# Patient Record
Sex: Female | Born: 1950 | Race: White | Hispanic: No | Smoking: Former smoker
Health system: Southern US, Community
[De-identification: ages and names within clinical notes are randomized; demographics above are authoritative.]

## PROBLEM LIST (undated history)

## (undated) DIAGNOSIS — R0600 Dyspnea, unspecified: Secondary | ICD-10-CM

## (undated) DIAGNOSIS — K219 Gastro-esophageal reflux disease without esophagitis: Secondary | ICD-10-CM

## (undated) DIAGNOSIS — I071 Rheumatic tricuspid insufficiency: Secondary | ICD-10-CM

## (undated) DIAGNOSIS — I499 Cardiac arrhythmia, unspecified: Secondary | ICD-10-CM

## (undated) DIAGNOSIS — M1990 Unspecified osteoarthritis, unspecified site: Secondary | ICD-10-CM

## (undated) DIAGNOSIS — I471 Supraventricular tachycardia, unspecified: Secondary | ICD-10-CM

## (undated) DIAGNOSIS — G4710 Hypersomnia, unspecified: Secondary | ICD-10-CM

## (undated) DIAGNOSIS — I4891 Unspecified atrial fibrillation: Secondary | ICD-10-CM

## (undated) DIAGNOSIS — F32A Depression, unspecified: Secondary | ICD-10-CM

## (undated) DIAGNOSIS — I272 Pulmonary hypertension, unspecified: Secondary | ICD-10-CM

## (undated) DIAGNOSIS — M797 Fibromyalgia: Secondary | ICD-10-CM

## (undated) DIAGNOSIS — K649 Unspecified hemorrhoids: Secondary | ICD-10-CM

## (undated) DIAGNOSIS — C801 Malignant (primary) neoplasm, unspecified: Secondary | ICD-10-CM

## (undated) DIAGNOSIS — E079 Disorder of thyroid, unspecified: Secondary | ICD-10-CM

## (undated) DIAGNOSIS — J3089 Other allergic rhinitis: Secondary | ICD-10-CM

## (undated) DIAGNOSIS — B019 Varicella without complication: Secondary | ICD-10-CM

## (undated) DIAGNOSIS — K2270 Barrett's esophagus without dysplasia: Secondary | ICD-10-CM

## (undated) DIAGNOSIS — I10 Essential (primary) hypertension: Secondary | ICD-10-CM

## (undated) DIAGNOSIS — M6150 Other ossification of muscle, unspecified site: Secondary | ICD-10-CM

## (undated) DIAGNOSIS — C3490 Malignant neoplasm of unspecified part of unspecified bronchus or lung: Secondary | ICD-10-CM

## (undated) DIAGNOSIS — R519 Headache, unspecified: Secondary | ICD-10-CM

## (undated) DIAGNOSIS — E039 Hypothyroidism, unspecified: Secondary | ICD-10-CM

## (undated) HISTORY — PX: KNEE ARTHROSCOPY: SHX127

## (undated) HISTORY — PX: KNEE ARTHROSCOPY: SUR90

## (undated) HISTORY — PX: BACK SURGERY: SHX140

## (undated) HISTORY — PX: GASTRIC BYPASS OPEN: SUR638

## (undated) HISTORY — PX: CHOLECYSTECTOMY: SHX55

## (undated) HISTORY — PX: TONSILLECTOMY: SUR1361

## (undated) HISTORY — PX: JOINT REPLACEMENT: SHX530

## (undated) HISTORY — PX: ABDOMINAL HYSTERECTOMY: SHX81

## (undated) HISTORY — PX: PLANTAR FASCIA RELEASE: SHX2239

## (undated) HISTORY — DX: Malignant neoplasm of unspecified part of unspecified bronchus or lung: C34.90

## (undated) HISTORY — PX: OTHER SURGICAL HISTORY: SHX169

## (undated) HISTORY — PX: BREAST BIOPSY: SHX20

## (undated) HISTORY — PX: HEMORRHOID SURGERY: SHX153

---
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
## 1977-02-14 HISTORY — PX: ABDOMINAL HYSTERECTOMY: SHX81

## 2004-11-17 DIAGNOSIS — Z96652 Presence of left artificial knee joint: Secondary | ICD-10-CM

## 2004-11-17 HISTORY — DX: Presence of left artificial knee joint: Z96.652

## 2004-11-17 HISTORY — PX: TOTAL KNEE ARTHROPLASTY: SHX125

## 2005-11-10 ENCOUNTER — Ambulatory Visit: Payer: Self-pay | Admitting: Internal Medicine

## 2006-07-12 ENCOUNTER — Ambulatory Visit: Payer: Self-pay | Admitting: Pain Medicine

## 2006-07-24 ENCOUNTER — Ambulatory Visit: Payer: Self-pay | Admitting: Pain Medicine

## 2006-08-10 ENCOUNTER — Ambulatory Visit: Payer: Self-pay | Admitting: Pain Medicine

## 2006-08-15 ENCOUNTER — Ambulatory Visit: Payer: Self-pay | Admitting: Gastroenterology

## 2006-08-23 ENCOUNTER — Ambulatory Visit: Payer: Self-pay | Admitting: Pain Medicine

## 2006-09-21 ENCOUNTER — Ambulatory Visit: Payer: Self-pay | Admitting: General Practice

## 2006-10-11 ENCOUNTER — Other Ambulatory Visit: Payer: Self-pay

## 2006-10-11 ENCOUNTER — Ambulatory Visit: Payer: Self-pay | Admitting: General Practice

## 2006-10-19 ENCOUNTER — Inpatient Hospital Stay: Payer: Self-pay | Admitting: General Practice

## 2006-10-19 HISTORY — PX: KNEE ARTHROSCOPY: SUR90

## 2006-11-15 ENCOUNTER — Ambulatory Visit: Payer: Self-pay | Admitting: Internal Medicine

## 2007-11-19 ENCOUNTER — Ambulatory Visit: Payer: Self-pay | Admitting: Internal Medicine

## 2008-02-15 HISTORY — PX: CHOLECYSTECTOMY: SHX55

## 2008-03-10 ENCOUNTER — Observation Stay: Payer: Self-pay | Admitting: Internal Medicine

## 2008-03-26 ENCOUNTER — Ambulatory Visit: Payer: Self-pay | Admitting: Internal Medicine

## 2008-06-21 ENCOUNTER — Ambulatory Visit: Payer: Self-pay | Admitting: Internal Medicine

## 2008-06-23 ENCOUNTER — Ambulatory Visit: Payer: Self-pay | Admitting: Internal Medicine

## 2008-07-09 ENCOUNTER — Ambulatory Visit: Payer: Self-pay | Admitting: Surgery

## 2008-11-21 ENCOUNTER — Ambulatory Visit: Payer: Self-pay | Admitting: Family Medicine

## 2009-02-14 HISTORY — PX: OTHER SURGICAL HISTORY: SHX169

## 2009-02-26 ENCOUNTER — Ambulatory Visit: Payer: Self-pay | Admitting: Internal Medicine

## 2009-05-27 ENCOUNTER — Ambulatory Visit: Payer: Self-pay | Admitting: Pain Medicine

## 2009-06-01 ENCOUNTER — Ambulatory Visit: Payer: Self-pay | Admitting: Obstetrics & Gynecology

## 2009-06-09 ENCOUNTER — Ambulatory Visit: Payer: Self-pay | Admitting: Obstetrics & Gynecology

## 2009-08-12 ENCOUNTER — Ambulatory Visit: Payer: Self-pay | Admitting: Pain Medicine

## 2009-08-20 ENCOUNTER — Ambulatory Visit: Payer: Self-pay | Admitting: Pain Medicine

## 2009-09-03 ENCOUNTER — Ambulatory Visit: Payer: Self-pay | Admitting: Pain Medicine

## 2009-09-22 ENCOUNTER — Ambulatory Visit: Payer: Self-pay | Admitting: Family Medicine

## 2009-09-24 ENCOUNTER — Ambulatory Visit: Payer: Self-pay | Admitting: Family Medicine

## 2009-09-25 ENCOUNTER — Ambulatory Visit: Payer: Self-pay | Admitting: Internal Medicine

## 2009-09-27 ENCOUNTER — Ambulatory Visit: Payer: Self-pay | Admitting: Internal Medicine

## 2009-09-30 ENCOUNTER — Ambulatory Visit: Payer: Self-pay | Admitting: Family Medicine

## 2009-10-01 ENCOUNTER — Ambulatory Visit: Payer: Self-pay | Admitting: Pain Medicine

## 2009-11-18 ENCOUNTER — Ambulatory Visit: Payer: Self-pay | Admitting: Pain Medicine

## 2009-12-03 ENCOUNTER — Ambulatory Visit: Payer: Self-pay | Admitting: Pain Medicine

## 2009-12-07 ENCOUNTER — Ambulatory Visit: Payer: Self-pay | Admitting: Pain Medicine

## 2009-12-21 ENCOUNTER — Ambulatory Visit: Payer: Self-pay | Admitting: Pain Medicine

## 2010-03-04 ENCOUNTER — Ambulatory Visit: Payer: Self-pay | Admitting: Internal Medicine

## 2010-10-03 ENCOUNTER — Ambulatory Visit: Payer: Self-pay | Admitting: Internal Medicine

## 2011-02-04 ENCOUNTER — Ambulatory Visit: Payer: Self-pay | Admitting: Internal Medicine

## 2011-06-08 ENCOUNTER — Ambulatory Visit: Payer: Self-pay | Admitting: Internal Medicine

## 2011-06-22 ENCOUNTER — Ambulatory Visit: Payer: Self-pay | Admitting: Internal Medicine

## 2011-12-22 ENCOUNTER — Ambulatory Visit: Payer: Self-pay | Admitting: Gastroenterology

## 2011-12-22 HISTORY — PX: COLONOSCOPY: SHX174

## 2012-04-14 ENCOUNTER — Emergency Department: Payer: Self-pay | Admitting: Emergency Medicine

## 2012-05-07 ENCOUNTER — Ambulatory Visit: Payer: Self-pay | Admitting: Internal Medicine

## 2013-06-04 ENCOUNTER — Ambulatory Visit: Payer: Self-pay | Admitting: Family Medicine

## 2013-06-04 LAB — URINALYSIS, COMPLETE
Bilirubin,UR: NEGATIVE
Glucose,UR: NEGATIVE mg/dL (ref 0–75)
KETONE: NEGATIVE
Leukocyte Esterase: NEGATIVE
NITRITE: NEGATIVE
PH: 6.5 (ref 4.5–8.0)
PROTEIN: NEGATIVE
RBC, UR: NONE SEEN /HPF (ref 0–5)
Specific Gravity: 1.005 (ref 1.003–1.030)

## 2013-07-24 ENCOUNTER — Ambulatory Visit: Payer: Self-pay | Admitting: Internal Medicine

## 2014-01-01 ENCOUNTER — Ambulatory Visit: Payer: Self-pay | Admitting: Gastroenterology

## 2014-06-09 LAB — SURGICAL PATHOLOGY

## 2014-07-24 ENCOUNTER — Other Ambulatory Visit: Payer: Self-pay | Admitting: Internal Medicine

## 2014-07-24 DIAGNOSIS — Z1231 Encounter for screening mammogram for malignant neoplasm of breast: Secondary | ICD-10-CM

## 2014-08-01 ENCOUNTER — Ambulatory Visit
Admission: RE | Admit: 2014-08-01 | Discharge: 2014-08-01 | Disposition: A | Discharge status: Home / Self Care | Payer: Medicare Other | Source: Ambulatory Visit | Attending: Internal Medicine | Admitting: Internal Medicine

## 2014-08-01 DIAGNOSIS — Z1231 Encounter for screening mammogram for malignant neoplasm of breast: Secondary | ICD-10-CM | POA: Diagnosis not present

## 2014-08-28 ENCOUNTER — Other Ambulatory Visit: Payer: Self-pay | Admitting: Physical Medicine and Rehabilitation

## 2014-08-28 DIAGNOSIS — M5416 Radiculopathy, lumbar region: Secondary | ICD-10-CM

## 2014-09-04 ENCOUNTER — Ambulatory Visit
Admission: RE | Admit: 2014-09-04 | Discharge: 2014-09-04 | Disposition: A | Discharge status: Home / Self Care | Payer: Medicare Other | Source: Ambulatory Visit | Attending: Physical Medicine and Rehabilitation | Admitting: Physical Medicine and Rehabilitation

## 2014-09-04 DIAGNOSIS — M545 Low back pain: Secondary | ICD-10-CM | POA: Diagnosis not present

## 2014-09-04 DIAGNOSIS — M5416 Radiculopathy, lumbar region: Secondary | ICD-10-CM

## 2015-04-02 ENCOUNTER — Other Ambulatory Visit: Payer: Self-pay | Admitting: Physician Assistant

## 2015-04-02 ENCOUNTER — Other Ambulatory Visit (HOSPITAL_COMMUNITY): Payer: Self-pay | Admitting: Physician Assistant

## 2015-04-02 DIAGNOSIS — M5416 Radiculopathy, lumbar region: Secondary | ICD-10-CM

## 2015-04-02 DIAGNOSIS — M5116 Intervertebral disc disorders with radiculopathy, lumbar region: Secondary | ICD-10-CM

## 2015-04-22 ENCOUNTER — Other Ambulatory Visit: Payer: Self-pay | Admitting: Physician Assistant

## 2015-04-22 ENCOUNTER — Ambulatory Visit
Admission: RE | Admit: 2015-04-22 | Discharge: 2015-04-22 | Disposition: A | Discharge status: Home / Self Care | Payer: Medicare Other | Source: Ambulatory Visit | Attending: Physician Assistant | Admitting: Physician Assistant

## 2015-04-22 DIAGNOSIS — I708 Atherosclerosis of other arteries: Secondary | ICD-10-CM | POA: Insufficient documentation

## 2015-04-22 DIAGNOSIS — M5116 Intervertebral disc disorders with radiculopathy, lumbar region: Secondary | ICD-10-CM | POA: Diagnosis present

## 2015-04-22 DIAGNOSIS — M5416 Radiculopathy, lumbar region: Secondary | ICD-10-CM

## 2015-06-24 HISTORY — PX: POSTERIOR LAMINECTOMY / DECOMPRESSION LUMBAR SPINE: SUR740

## 2015-11-04 ENCOUNTER — Other Ambulatory Visit: Payer: Self-pay | Admitting: Internal Medicine

## 2015-11-04 DIAGNOSIS — Z1231 Encounter for screening mammogram for malignant neoplasm of breast: Secondary | ICD-10-CM

## 2015-11-10 ENCOUNTER — Ambulatory Visit
Admission: RE | Admit: 2015-11-10 | Discharge: 2015-11-10 | Disposition: A | Discharge status: Home / Self Care | Payer: Medicare Other | Source: Ambulatory Visit | Attending: Internal Medicine | Admitting: Internal Medicine

## 2015-11-10 DIAGNOSIS — Z1231 Encounter for screening mammogram for malignant neoplasm of breast: Secondary | ICD-10-CM | POA: Diagnosis present

## 2016-01-12 ENCOUNTER — Ambulatory Visit
Admission: EM | Admit: 2016-01-12 | Discharge: 2016-01-12 | Disposition: A | Discharge status: Home / Self Care | Payer: Medicare Other | Attending: Emergency Medicine | Admitting: Emergency Medicine

## 2016-01-12 DIAGNOSIS — J069 Acute upper respiratory infection, unspecified: Secondary | ICD-10-CM

## 2016-01-12 HISTORY — DX: Essential (primary) hypertension: I10

## 2016-01-12 HISTORY — DX: Disorder of thyroid, unspecified: E07.9

## 2016-01-12 HISTORY — DX: Fibromyalgia: M79.7

## 2016-01-12 HISTORY — DX: Hypersomnia, unspecified: G47.10

## 2016-01-12 HISTORY — DX: Varicella without complication: B01.9

## 2016-01-12 HISTORY — DX: Unspecified hemorrhoids: K64.9

## 2016-01-12 HISTORY — DX: Gastro-esophageal reflux disease without esophagitis: K21.9

## 2016-01-12 HISTORY — DX: Unspecified osteoarthritis, unspecified site: M19.90

## 2016-01-12 HISTORY — DX: Other ossification of muscle, unspecified site: M61.50

## 2016-01-12 MED ORDER — AEROCHAMBER PLUS MISC: 2 refills | Status: DC

## 2016-01-12 MED ORDER — GUAIFENESIN-CODEINE 100-10 MG/5ML PO SYRP: 10.0000 mL | ORAL_SOLUTION | Freq: Four times a day (QID) | ORAL | 0 refills | Status: DC | PRN

## 2016-01-12 MED ORDER — BENZONATATE 200 MG PO CAPS: 200.0000 mg | ORAL_CAPSULE | Freq: Three times a day (TID) | ORAL | 0 refills | Status: DC | PRN

## 2016-01-12 MED ORDER — AZITHROMYCIN 250 MG PO TABS: 250.0000 mg | ORAL_TABLET | Freq: Every day | ORAL | 0 refills | Status: DC

## 2016-01-12 MED ORDER — MOMETASONE FUROATE 50 MCG/ACT NA SUSP: 2.0000 | Freq: Every day | NASAL | 0 refills | Status: DC

## 2016-01-12 MED ORDER — ALBUTEROL SULFATE HFA 108 (90 BASE) MCG/ACT IN AERS: 1.0000 | INHALATION_SPRAY | Freq: Four times a day (QID) | RESPIRATORY_TRACT | 0 refills | Status: DC | PRN

## 2016-01-12 NOTE — Discharge Instructions (Signed)
You may take 600- 800 mg of motrin with 1 gram of tylenol up to 3 times a day as needed for pain. This is an effective combination for pain.  Use a neti pot or the NeilMed sinus rinse as often as you want to to reduce nasal congestion. Follow the directions on the box.   Go to www.goodrx.com to look up your medications. This will give you a list of where you can find your prescriptions at the most affordable prices.

## 2016-01-12 NOTE — ED Provider Notes (Signed)
HPI  SUBJECTIVE:  Glenda Gomez is a 65 y.o. female who presents with nasal congestion, rhinorrhea, postnasal drip, scratchy throat, cough productive of thick yellowish sputum, chest tightness and shortness of breath secondary to the cough and the past 8 days. States that she is not getting worse, but is just not "getting better". She reports sinus pressure and a frontal sinus headache. She has tried over-the-counter cough syrup, Mucinex without improvement in her symptoms. There are no other aggravating or alleviating factors. No sinus pain, upper dental pain, purulent nasal drainage. No fevers, bodyaches, no antipyretic in the past 6-8 hours. no recent antibiotics. she did get a flu shot this year. Past medical history of hypertension, hypothyroidism, bronchitis. No history of asthma, emphysema, COPD, smoking, pneumonia, diabetes. JYN:WGNFA,[email or web address], MD    . Diagnosis Date  . Arthritis   . Chicken pox   . Fibromyalgia   . GERD (gastroesophageal reflux disease)   . Hemorrhoid   . Hypersomnia   . Hypertension   . Myositis ossificans   . Osteoarthritis   . Sleep apnea   . Thyroid disease     Past Surgical History:  Procedure Laterality Date  .  bengin tongue lesion     Benign  . ABDOMINAL HYSTERECTOMY    . bladder tack    . BREAST BIOPSY Left    neg  . CHOLECYSTECTOMY    . GASTRIC BYPASS OPEN    . hemrrhoidectomy    . KNEE ARTHROSCOPY    . plantar fascia Left   . TONSILLECTOMY      Family History  Problem Relation Age of Onset  . Breast cancer Sister 69  . Breast cancer Maternal Aunt 80  . Breast cancer Sister 67  . Alzheimer's disease Mother   . Arthritis Mother   . Diabetes Mother   . Cancer Mother   . Heart failure Brother     Social History  Substance Use Topics  . Smoking status: Never Smoker  . Smokeless tobacco: Never Used  . Alcohol use No    No current facility-administered medications for this encounter.   Current Outpatient Prescriptions:  .   cyclobenzaprine (FLEXERIL) 10 MG tablet, Take 10 mg by mouth 3 (three) times daily as needed for muscle spasms., Disp: , Rfl:  .  diclofenac (VOLTAREN) 75 MG EC tablet, Take 75 mg by mouth 2 (two) times daily., Disp: , Rfl:  .  estradiol (ESTRACE) 0.5 MG tablet, Take 0.5 mg by mouth daily., Disp: , Rfl:  .  levothyroxine (SYNTHROID, LEVOTHROID) 100 MCG tablet, Take 90 mcg by mouth daily before breakfast., Disp: , Rfl:  .  omeprazole (PRILOSEC) 20 MG capsule, Take 30 mg by mouth daily., Disp: , Rfl:  .  traZODone (DESYREL) 50 MG tablet, Take 50 mg by mouth at bedtime., Disp: , Rfl:  .  triamterene-hydrochlorothiazide (MAXZIDE-25) 37.5-25 MG tablet, Take 1 tablet by mouth daily., Disp: , Rfl:  .  zolpidem (AMBIEN) 5 MG tablet, Take 5 mg by mouth at bedtime as needed for sleep., Disp: , Rfl:  .  albuterol (PROVENTIL HFA;VENTOLIN HFA) 108 (90 Base) MCG/ACT inhaler, Inhale 1-2 puffs into the lungs every 6 (six) hours as needed for wheezing or shortness of breath., Disp: 1 Inhaler, Rfl: 0 .  azithromycin (ZITHROMAX) 250 MG tablet, Take 1 tablet (250 mg total) by mouth daily. 2 tabs po on day 1, 1 tab po on days 2-5, Disp: 6 tablet, Rfl: 0 .  benzonatate (TESSALON) 200 MG capsule, Take 1 capsule (  200 mg total) by mouth 3 (three) times daily as needed for cough., Disp: 20 capsule, Rfl: 0 .  guaiFENesin-codeine (CHERATUSSIN AC) 100-10 MG/5ML syrup, Take 10 mLs by mouth 4 (four) times daily as needed for cough or congestion., Disp: 120 mL, Rfl: 0 .  mometasone (NASONEX) 50 MCG/ACT nasal spray, Place 2 sprays into the nose daily., Disp: 17 g, Rfl: 0 .  Spacer/Aero-Holding Chambers (AEROCHAMBER PLUS) inhaler, Use as instructed, Disp: 1 each, Rfl: 2  Allergies  Allergen Reactions  . Flagyl [Metronidazole] Diarrhea     ROS  As noted in HPI.   Physical Exam  BP (!) 158/76 (BP Location: Left Arm)   Pulse 62   Temp 97.9 F (36.6 C) (Oral)   Resp 18   Ht '5\' 3"'$  (1.6 m)   Wt 220 lb (99.8 kg)   SpO2  100%   BMI 38.97 kg/m   Constitutional: Well developed, well nourished, no acute distress Eyes:  EOMI, conjunctiva normal bilaterally HENT: Normocephalic, atraumatic,mucus membranes moist. No sinus tenderness. Positive mild nasal congestion. Normal turbinates. Tonsils surgically absent. Positive postnasal drip.  Neck: No cervical lymphadenopathy  Respiratory: Normal inspiratory effort lungs clear bilaterally, good air movement  Cardiovascular: Normal rate regular rhythm no murmurs rubs or gallops GI: nondistended skin: No rash, skin intact Musculoskeletal: no deformities Neurologic: Alert & oriented x 3, no focal neuro deficits Psychiatric: Speech and behavior appropriate   ED Course   Medications - No data to display  No orders of the defined types were placed in this encounter.   No results found for this or any previous visit (from the past 24 hour(s)). No results found.  ED Clinical Impression  Acute upper respiratory infection   ED Assessment/Plan  Presentation consistent with URI, however, given duration of symptoms and age, will send home with azithromycin for 5 days in addition to Nasonex, cheratussin, Tessalon, albuterol with spacer. She is start saline nasal irrigation continue Mucinex. Advised that she may have a lingering cough for the next 2-3 weeks as she continues to heal and that this does not necessarily indicate need for repeat round of antibiotics. Follow up with PMD as needed. Discussed  MDM, plan and followup with patient. Patient agrees with plan.   Meds ordered this encounter  Medications  . levothyroxine (SYNTHROID, LEVOTHROID) 100 MCG tablet    Sig: Take 90 mcg by mouth daily before breakfast.  . omeprazole (PRILOSEC) 20 MG capsule    Sig: Take 30 mg by mouth daily.  Marland Kitchen DISCONTD: cetirizine (ZYRTEC) 10 MG tablet    Sig: Take 10 mg by mouth daily.  . cyclobenzaprine (FLEXERIL) 10 MG tablet    Sig: Take 10 mg by mouth 3 (three) times daily as  needed for muscle spasms.  . diclofenac (VOLTAREN) 75 MG EC tablet    Sig: Take 75 mg by mouth 2 (two) times daily.  . traZODone (DESYREL) 50 MG tablet    Sig: Take 50 mg by mouth at bedtime.  Marland Kitchen zolpidem (AMBIEN) 5 MG tablet    Sig: Take 5 mg by mouth at bedtime as needed for sleep.  Marland Kitchen DISCONTD: traMADol (ULTRAM) 50 MG tablet    Sig: Take by mouth every 6 (six) hours as needed.  . triamterene-hydrochlorothiazide (MAXZIDE-25) 37.5-25 MG tablet    Sig: Take 1 tablet by mouth daily.  Marland Kitchen estradiol (ESTRACE) 0.5 MG tablet    Sig: Take 0.5 mg by mouth daily.  Marland Kitchen albuterol (PROVENTIL HFA;VENTOLIN HFA) 108 (90 Base) MCG/ACT inhaler  Sig: Inhale 1-2 puffs into the lungs every 6 (six) hours as needed for wheezing or shortness of breath.    Dispense:  1 Inhaler    Refill:  0  . mometasone (NASONEX) 50 MCG/ACT nasal spray    Sig: Place 2 sprays into the nose daily.    Dispense:  17 g    Refill:  0  . Spacer/Aero-Holding Chambers (AEROCHAMBER PLUS) inhaler    Sig: Use as instructed    Dispense:  1 each    Refill:  2  . benzonatate (TESSALON) 200 MG capsule    Sig: Take 1 capsule (200 mg total) by mouth 3 (three) times daily as needed for cough.    Dispense:  20 capsule    Refill:  0  . azithromycin (ZITHROMAX) 250 MG tablet    Sig: Take 1 tablet (250 mg total) by mouth daily. 2 tabs po on day 1, 1 tab po on days 2-5    Dispense:  6 tablet    Refill:  0  . guaiFENesin-codeine (CHERATUSSIN AC) 100-10 MG/5ML syrup    Sig: Take 10 mLs by mouth 4 (four) times daily as needed for cough or congestion.    Dispense:  120 mL    Refill:  0    *This clinic note was created using [employer]. Therefore, there may be occasional mistakes despite careful proofreading.  ?   Melynda Ripple, MD 01/12/16 [phone]

## 2016-01-12 NOTE — ED Triage Notes (Signed)
Pt c/o cough, and head congestion. Watery eyes and sinus pressure.

## 2016-06-22 ENCOUNTER — Encounter
Admission: RE | Admit: 2016-06-22 | Discharge: 2016-06-22 | Disposition: A | Discharge status: Home / Self Care | Payer: Medicare Other | Source: Ambulatory Visit | Attending: Orthopedic Surgery | Admitting: Orthopedic Surgery

## 2016-06-22 DIAGNOSIS — Z01810 Encounter for preprocedural cardiovascular examination: Secondary | ICD-10-CM | POA: Insufficient documentation

## 2016-06-22 DIAGNOSIS — I10 Essential (primary) hypertension: Secondary | ICD-10-CM | POA: Diagnosis not present

## 2016-06-22 DIAGNOSIS — Z01812 Encounter for preprocedural laboratory examination: Secondary | ICD-10-CM | POA: Diagnosis not present

## 2016-06-22 HISTORY — DX: Hypothyroidism, unspecified: E03.9

## 2016-06-22 LAB — URINALYSIS, ROUTINE W REFLEX MICROSCOPIC
Bilirubin Urine: NEGATIVE
GLUCOSE, UA: NEGATIVE mg/dL
HGB URINE DIPSTICK: NEGATIVE
Ketones, ur: NEGATIVE mg/dL
LEUKOCYTES UA: NEGATIVE
Nitrite: NEGATIVE
PH: 5 (ref 5.0–8.0)
Protein, ur: NEGATIVE mg/dL
Specific Gravity, Urine: 1.017 (ref 1.005–1.030)

## 2016-06-22 LAB — TYPE AND SCREEN
ABO/RH(D): A POS
Antibody Screen: NEGATIVE

## 2016-06-22 LAB — COMPREHENSIVE METABOLIC PANEL
ALK PHOS: 84 U/L (ref 38–126)
ALT: 16 U/L (ref 14–54)
ANION GAP: 9 (ref 5–15)
AST: 23 U/L (ref 15–41)
Albumin: 3.9 g/dL (ref 3.5–5.0)
BUN: 20 mg/dL (ref 6–20)
CALCIUM: 9.2 mg/dL (ref 8.9–10.3)
CHLORIDE: 103 mmol/L (ref 101–111)
CO2: 26 mmol/L (ref 22–32)
CREATININE: 0.82 mg/dL (ref 0.44–1.00)
Glucose, Bld: 95 mg/dL (ref 65–99)
Potassium: 3.6 mmol/L (ref 3.5–5.1)
SODIUM: 138 mmol/L (ref 135–145)
Total Bilirubin: 0.4 mg/dL (ref 0.3–1.2)
Total Protein: 7.4 g/dL (ref 6.5–8.1)

## 2016-06-22 LAB — CBC
HCT: 41.1 % (ref 35.0–47.0)
Hemoglobin: 13.7 g/dL (ref 12.0–16.0)
MCH: 25.9 pg — AB (ref 26.0–34.0)
MCHC: 33.3 g/dL (ref 32.0–36.0)
MCV: 77.8 fL — AB (ref 80.0–100.0)
PLATELETS: 304 10*3/uL (ref 150–440)
RBC: 5.29 MIL/uL — AB (ref 3.80–5.20)
RDW: 15 % — ABNORMAL HIGH (ref 11.5–14.5)
WBC: 8.1 10*3/uL (ref 3.6–11.0)

## 2016-06-22 LAB — SURGICAL PCR SCREEN
MRSA, PCR: NEGATIVE
STAPHYLOCOCCUS AUREUS: NEGATIVE

## 2016-06-22 LAB — APTT: aPTT: 27 seconds (ref 24–36)

## 2016-06-22 LAB — PROTIME-INR
INR: 0.98
PROTHROMBIN TIME: 13 s (ref 11.4–15.2)

## 2016-06-22 LAB — C-REACTIVE PROTEIN: CRP: 0.9 mg/dL (ref <1.0)

## 2016-06-22 LAB — SEDIMENTATION RATE: Sed Rate: 10 mm/hr (ref 0–30)

## 2016-06-22 NOTE — Pre-Procedure Instructions (Signed)
Dr. Rosey Bath notified of patient history of myositis ossificans, and stated "no problems for anesthesia."

## 2016-06-22 NOTE — Patient Instructions (Signed)
Your procedure is scheduled on: Jul 06, 2016 (Wednesday) Report to Same Day Surgery 2nd floor medical mall [facility] Entrance-take elevator on left to 2nd floor.  Check in with surgery information desk.) To find out your arrival time please call [phone] between 1PM - 3PM on Jul 05, 2016 (Tuesday)  Remember: Instructions that are not followed completely may result in serious medical risk, up to and including death, or upon the discretion of your surgeon and anesthesiologist your surgery may need to be rescheduled.    _x___ 1. Do not eat food or drink liquids after midnight. No gum chewing or hard candies                                __x__ 2. No Alcohol for 24 hours before or after surgery.   __x__3. No Smoking for 24 prior to surgery.   ____  4. Bring all medications with you on the day of surgery if instructed.    __x__ 5. Notify your doctor if there is any change in your medical condition     (cold, fever, infections).     Do not wear jewelry, make-up, hairpins, clips or nail polish.  Do not wear lotions, powders, or perfumes.   Do not shave 48 hours prior to surgery. Men may shave face and neck.  Do not bring valuables to the hospital.    [facility] is not responsible for any belongings or valuables.               Contacts, dentures or bridgework may not be worn into surgery.  Leave your suitcase in the car. After surgery it may be brought to your room.  For patients admitted to the hospital, discharge time is determined by your  treatment team                    Patients discharged the day of surgery will not be allowed to drive home.  You will need someone to drive you home and stay with you the night of your procedure.    Please read over the following fact sheets that you were given:   [facility] Preparing for Surgery and or MRSA Information   _x___ Take anti-hypertensive (unless it includes a diuretic), cardiac, seizure, asthma,     anti-reflux and psychiatric  medicines with a sip of water These include:  1. OMEPRAZOLE (OMEPRAZOLE AT BEDTIME ON MAY  22 )  2. SYNTHROID      ____Fleets enema or Magnesium Citrate as directed.   _x___ Use CHG Soap or sage wipes as directed on instruction sheet   ____ Use inhalers on the day of surgery and bring to hospital day of surgery  ____ Stop Metformin and Janumet 2 days prior to surgery.    ____ Take 1/2 of usual insulin dose the night before surgery and none on the morning surgery      _x___ Follow recommendations from Cardiologist, Pulmonologist or PCP regarding          stopping Aspirin, Coumadin, Pllavix ,Eliquis, Effient, or Pradaxa, and Pletal.  X____Stop Anti-inflammatories such as Advil, Aleve, Ibuprofen, Motrin, Naproxen, Naprosyn, Goodies powders or aspirin products. OK to take Tylenol    (STOP DICLOFENAC ONE WEEK PRIOR TO SURGERY)   _x___ Stop supplements until after surgery.  But may continue Vitamin D, Vitamin B, and multivitamin (STOP BIOTIN NOW )      ____ [employer]  C-Pap to the hospital.

## 2016-06-24 LAB — URINE CULTURE
CULTURE: NO GROWTH
Special Requests: NORMAL

## 2016-06-29 ENCOUNTER — Other Ambulatory Visit: Payer: Medicare Other

## 2016-07-05 MED ORDER — TRANEXAMIC ACID 1000 MG/10ML IV SOLN
1000.0000 mg | INTRAVENOUS | Status: DC
  Filled 2016-07-05: qty 10

## 2016-07-05 MED ORDER — CEFAZOLIN SODIUM-DEXTROSE 2-4 GM/100ML-% IV SOLN: 2.0000 g | INTRAVENOUS | Status: DC

## 2016-07-06 ENCOUNTER — Ambulatory Visit: Payer: Medicare Other | Admitting: *Deleted

## 2016-07-06 ENCOUNTER — Inpatient Hospital Stay: Payer: Medicare Other

## 2016-07-06 ENCOUNTER — Inpatient Hospital Stay
Admission: RE | Admit: 2016-07-06 | Discharge: 2016-07-08 | DRG: 470 | Disposition: A | Discharge status: Home Health | Payer: Medicare Other | Source: Ambulatory Visit | Attending: Orthopedic Surgery | Admitting: Orthopedic Surgery

## 2016-07-06 ENCOUNTER — Encounter: Payer: Self-pay | Admitting: Orthopedic Surgery

## 2016-07-06 ENCOUNTER — Encounter
Admission: RE | Disposition: A | Discharge status: Home Health | Payer: Self-pay | Source: Ambulatory Visit | Attending: Orthopedic Surgery

## 2016-07-06 DIAGNOSIS — I10 Essential (primary) hypertension: Secondary | ICD-10-CM | POA: Diagnosis present

## 2016-07-06 DIAGNOSIS — M1711 Unilateral primary osteoarthritis, right knee: Secondary | ICD-10-CM | POA: Diagnosis present

## 2016-07-06 DIAGNOSIS — K219 Gastro-esophageal reflux disease without esophagitis: Secondary | ICD-10-CM | POA: Diagnosis present

## 2016-07-06 DIAGNOSIS — E669 Obesity, unspecified: Secondary | ICD-10-CM | POA: Diagnosis present

## 2016-07-06 DIAGNOSIS — E039 Hypothyroidism, unspecified: Secondary | ICD-10-CM | POA: Diagnosis present

## 2016-07-06 DIAGNOSIS — Z79899 Other long term (current) drug therapy: Secondary | ICD-10-CM | POA: Diagnosis not present

## 2016-07-06 DIAGNOSIS — Z96659 Presence of unspecified artificial knee joint: Secondary | ICD-10-CM

## 2016-07-06 DIAGNOSIS — Z96651 Presence of right artificial knee joint: Secondary | ICD-10-CM

## 2016-07-06 DIAGNOSIS — Z6841 Body Mass Index (BMI) 40.0 and over, adult: Secondary | ICD-10-CM | POA: Diagnosis not present

## 2016-07-06 DIAGNOSIS — M797 Fibromyalgia: Secondary | ICD-10-CM | POA: Diagnosis present

## 2016-07-06 DIAGNOSIS — G4730 Sleep apnea, unspecified: Secondary | ICD-10-CM | POA: Diagnosis present

## 2016-07-06 DIAGNOSIS — Z888 Allergy status to other drugs, medicaments and biological substances status: Secondary | ICD-10-CM

## 2016-07-06 DIAGNOSIS — Z9884 Bariatric surgery status: Secondary | ICD-10-CM | POA: Diagnosis not present

## 2016-07-06 HISTORY — PX: KNEE ARTHROPLASTY: SHX992

## 2016-07-06 HISTORY — DX: Presence of right artificial knee joint: Z96.651

## 2016-07-06 LAB — ABO/RH: ABO/RH(D): A POS

## 2016-07-06 SURGERY — ARTHROPLASTY, KNEE, TOTAL, USING IMAGELESS COMPUTER-ASSISTED NAVIGATION
Anesthesia: Spinal | Laterality: Right

## 2016-07-06 MED ORDER — PROPOFOL 500 MG/50ML IV EMUL
INTRAVENOUS | Status: DC | PRN
  Administered 2016-07-06: 75 ug/kg/min via INTRAVENOUS

## 2016-07-06 MED ORDER — FENTANYL CITRATE (PF) 100 MCG/2ML IJ SOLN
INTRAMUSCULAR | Status: AC
  Filled 2016-07-06: qty 2

## 2016-07-06 MED ORDER — EPHEDRINE SULFATE 50 MG/ML IJ SOLN
INTRAMUSCULAR | Status: DC | PRN
  Administered 2016-07-06 (×2): 10 mg via INTRAVENOUS

## 2016-07-06 MED ORDER — PROMETHAZINE HCL 25 MG/ML IJ SOLN
12.5000 mg | Freq: Once | INTRAMUSCULAR | Status: AC
  Administered 2016-07-06: 12.5 mg via INTRAVENOUS

## 2016-07-06 MED ORDER — ONDANSETRON HCL 4 MG/2ML IJ SOLN: 4.0000 mg | Freq: Four times a day (QID) | INTRAMUSCULAR | Status: DC | PRN

## 2016-07-06 MED ORDER — ESTRADIOL 1 MG PO TABS
0.5000 mg | ORAL_TABLET | Freq: Every day | ORAL | Status: DC
  Administered 2016-07-07 – 2016-07-08 (×2): 0.5 mg via ORAL
  Filled 2016-07-06 (×2): qty 1

## 2016-07-06 MED ORDER — KETAMINE HCL 50 MG/ML IJ SOLN
INTRAMUSCULAR | Status: AC
  Filled 2016-07-06: qty 10

## 2016-07-06 MED ORDER — MIDAZOLAM HCL 2 MG/2ML IJ SOLN
1.0000 mg | Freq: Once | INTRAMUSCULAR | Status: AC
  Administered 2016-07-06: 1 mg via INTRAVENOUS

## 2016-07-06 MED ORDER — PHENYLEPHRINE HCL 10 MG/ML IJ SOLN
INTRAMUSCULAR | Status: DC | PRN
Start: 2016-07-06 — End: 2016-07-06
  Administered 2016-07-06 (×5): 100 ug via INTRAVENOUS

## 2016-07-06 MED ORDER — FLUTICASONE PROPIONATE 50 MCG/ACT NA SUSP
2.0000 | Freq: Two times a day (BID) | NASAL | Status: DC | PRN
  Filled 2016-07-06: qty 16

## 2016-07-06 MED ORDER — OXYCODONE HCL 5 MG PO TABS
5.0000 mg | ORAL_TABLET | ORAL | Status: DC | PRN
  Administered 2016-07-06: 5 mg via ORAL
  Administered 2016-07-07 – 2016-07-08 (×5): 10 mg via ORAL
  Filled 2016-07-06: qty 1
  Filled 2016-07-06 (×5): qty 2

## 2016-07-06 MED ORDER — MENTHOL 3 MG MT LOZG
1.0000 | LOZENGE | OROMUCOSAL | Status: DC | PRN
  Filled 2016-07-06: qty 9

## 2016-07-06 MED ORDER — PHENOL 1.4 % MT LIQD
1.0000 | OROMUCOSAL | Status: DC | PRN
  Filled 2016-07-06: qty 177

## 2016-07-06 MED ORDER — PROPOFOL 10 MG/ML IV BOLUS
INTRAVENOUS | Status: DC | PRN
  Administered 2016-07-06: 40 mg via INTRAVENOUS

## 2016-07-06 MED ORDER — LACTATED RINGERS IV SOLN
INTRAVENOUS | Status: DC
  Administered 2016-07-06 (×3): via INTRAVENOUS

## 2016-07-06 MED ORDER — FERROUS SULFATE 325 (65 FE) MG PO TABS
325.0000 mg | ORAL_TABLET | Freq: Two times a day (BID) | ORAL | Status: DC
  Administered 2016-07-07 – 2016-07-08 (×3): 325 mg via ORAL
  Filled 2016-07-06 (×3): qty 1

## 2016-07-06 MED ORDER — BUPIVACAINE IN DEXTROSE 0.75-8.25 % IT SOLN
INTRATHECAL | Status: DC | PRN
  Administered 2016-07-06: 1.6 mL via INTRATHECAL

## 2016-07-06 MED ORDER — CEFAZOLIN SODIUM-DEXTROSE 2-3 GM-% IV SOLR
INTRAVENOUS | Status: DC | PRN
  Administered 2016-07-06: 2 g via INTRAVENOUS

## 2016-07-06 MED ORDER — SODIUM CHLORIDE 0.9 % IJ SOLN
INTRAMUSCULAR | Status: AC
  Filled 2016-07-06: qty 20

## 2016-07-06 MED ORDER — FLEET ENEMA 7-19 GM/118ML RE ENEM: 1.0000 | ENEMA | Freq: Once | RECTAL | Status: DC | PRN

## 2016-07-06 MED ORDER — FENTANYL CITRATE (PF) 100 MCG/2ML IJ SOLN
INTRAMUSCULAR | Status: AC
  Administered 2016-07-06: 25 ug via INTRAVENOUS
  Filled 2016-07-06: qty 2

## 2016-07-06 MED ORDER — HYDROMORPHONE HCL 1 MG/ML IJ SOLN
0.5000 mg | INTRAMUSCULAR | Status: DC | PRN
  Administered 2016-07-06 (×3): 0.5 mg via INTRAVENOUS

## 2016-07-06 MED ORDER — ENOXAPARIN SODIUM 40 MG/0.4ML ~~LOC~~ SOLN
40.0000 mg | Freq: Two times a day (BID) | SUBCUTANEOUS | Status: DC
  Administered 2016-07-07 – 2016-07-08 (×3): 40 mg via SUBCUTANEOUS
  Filled 2016-07-06 (×3): qty 0.4

## 2016-07-06 MED ORDER — ONDANSETRON HCL 4 MG PO TABS: 4.0000 mg | ORAL_TABLET | Freq: Four times a day (QID) | ORAL | Status: DC | PRN

## 2016-07-06 MED ORDER — PROPOFOL 500 MG/50ML IV EMUL
INTRAVENOUS | Status: AC
  Filled 2016-07-06: qty 50

## 2016-07-06 MED ORDER — CYCLOBENZAPRINE HCL 10 MG PO TABS
10.0000 mg | ORAL_TABLET | Freq: Three times a day (TID) | ORAL | Status: DC | PRN
  Administered 2016-07-07: 10 mg via ORAL
  Filled 2016-07-06: qty 1

## 2016-07-06 MED ORDER — FENTANYL CITRATE (PF) 100 MCG/2ML IJ SOLN
25.0000 ug | INTRAMUSCULAR | Status: AC | PRN
  Administered 2016-07-06 (×6): 25 ug via INTRAVENOUS

## 2016-07-06 MED ORDER — MIDAZOLAM HCL 2 MG/2ML IJ SOLN
INTRAMUSCULAR | Status: AC
  Filled 2016-07-06: qty 2

## 2016-07-06 MED ORDER — BUPIVACAINE HCL (PF) 0.25 % IJ SOLN
INTRAMUSCULAR | Status: DC | PRN
  Administered 2016-07-06: 60 mL

## 2016-07-06 MED ORDER — BUPIVACAINE HCL (PF) 0.25 % IJ SOLN
INTRAMUSCULAR | Status: AC
  Filled 2016-07-06: qty 60

## 2016-07-06 MED ORDER — ACETAMINOPHEN 10 MG/ML IV SOLN
1000.0000 mg | Freq: Four times a day (QID) | INTRAVENOUS | Status: AC
  Administered 2016-07-06 – 2016-07-07 (×4): 1000 mg via INTRAVENOUS
  Filled 2016-07-06 (×4): qty 100

## 2016-07-06 MED ORDER — HYDROMORPHONE HCL 1 MG/ML IJ SOLN
INTRAMUSCULAR | Status: AC
  Filled 2016-07-06: qty 1

## 2016-07-06 MED ORDER — LORATADINE 10 MG PO TABS
10.0000 mg | ORAL_TABLET | Freq: Every day | ORAL | Status: DC
  Administered 2016-07-07 – 2016-07-08 (×2): 10 mg via ORAL
  Filled 2016-07-06 (×2): qty 1

## 2016-07-06 MED ORDER — SENNOSIDES-DOCUSATE SODIUM 8.6-50 MG PO TABS
1.0000 | ORAL_TABLET | Freq: Two times a day (BID) | ORAL | Status: DC
  Administered 2016-07-06 – 2016-07-08 (×4): 1 via ORAL
  Filled 2016-07-06 (×4): qty 1

## 2016-07-06 MED ORDER — ACETAMINOPHEN 650 MG RE SUPP: 650.0000 mg | Freq: Four times a day (QID) | RECTAL | Status: DC | PRN

## 2016-07-06 MED ORDER — ZOLPIDEM TARTRATE 5 MG PO TABS
5.0000 mg | ORAL_TABLET | Freq: Every day | ORAL | Status: DC
  Administered 2016-07-06 – 2016-07-07 (×2): 5 mg via ORAL
  Filled 2016-07-06 (×2): qty 1

## 2016-07-06 MED ORDER — PANTOPRAZOLE SODIUM 40 MG PO TBEC
40.0000 mg | DELAYED_RELEASE_TABLET | Freq: Two times a day (BID) | ORAL | Status: DC
  Administered 2016-07-06 – 2016-07-08 (×4): 40 mg via ORAL
  Filled 2016-07-06 (×4): qty 1

## 2016-07-06 MED ORDER — METOCLOPRAMIDE HCL 10 MG PO TABS
10.0000 mg | ORAL_TABLET | Freq: Three times a day (TID) | ORAL | Status: DC
  Administered 2016-07-06 – 2016-07-08 (×6): 10 mg via ORAL
  Filled 2016-07-06 (×6): qty 1

## 2016-07-06 MED ORDER — NEOMYCIN-POLYMYXIN B GU 40-200000 IR SOLN
Status: DC | PRN
Start: 2016-07-06 — End: 2016-07-06
  Administered 2016-07-06: 14 mL

## 2016-07-06 MED ORDER — KETAMINE HCL 10 MG/ML IJ SOLN
INTRAMUSCULAR | Status: DC | PRN
  Administered 2016-07-06: 25 mg via INTRAVENOUS

## 2016-07-06 MED ORDER — ADULT MULTIVITAMIN W/MINERALS CH
1.0000 | ORAL_TABLET | Freq: Every day | ORAL | Status: DC
  Administered 2016-07-07 – 2016-07-08 (×2): 1 via ORAL
  Filled 2016-07-06 (×2): qty 1

## 2016-07-06 MED ORDER — ONDANSETRON HCL 4 MG/2ML IJ SOLN: 4.0000 mg | Freq: Once | INTRAMUSCULAR | Status: DC | PRN

## 2016-07-06 MED ORDER — TRANEXAMIC ACID 1000 MG/10ML IV SOLN
1000.0000 mg | Freq: Once | INTRAVENOUS | Status: AC
  Administered 2016-07-06: 1000 mg via INTRAVENOUS
  Filled 2016-07-06: qty 10

## 2016-07-06 MED ORDER — TRAMADOL HCL 50 MG PO TABS
50.0000 mg | ORAL_TABLET | ORAL | Status: DC | PRN
  Administered 2016-07-06: 50 mg via ORAL
  Administered 2016-07-07 (×3): 100 mg via ORAL
  Filled 2016-07-06 (×3): qty 2
  Filled 2016-07-06: qty 1

## 2016-07-06 MED ORDER — MAGNESIUM HYDROXIDE 400 MG/5ML PO SUSP: 30.0000 mL | Freq: Every day | ORAL | Status: DC | PRN

## 2016-07-06 MED ORDER — ACETAMINOPHEN 10 MG/ML IV SOLN
INTRAVENOUS | Status: AC
  Administered 2016-07-06: 1000 mg via INTRAVENOUS
  Filled 2016-07-06: qty 100

## 2016-07-06 MED ORDER — PROMETHAZINE HCL 25 MG/ML IJ SOLN
INTRAMUSCULAR | Status: AC
  Administered 2016-07-06: 12.5 mg via INTRAVENOUS
  Filled 2016-07-06: qty 1

## 2016-07-06 MED ORDER — TRAZODONE HCL 50 MG PO TABS
50.0000 mg | ORAL_TABLET | Freq: Every day | ORAL | Status: DC
  Administered 2016-07-06 – 2016-07-07 (×2): 50 mg via ORAL
  Filled 2016-07-06 (×2): qty 1

## 2016-07-06 MED ORDER — FENTANYL CITRATE (PF) 100 MCG/2ML IJ SOLN
INTRAMUSCULAR | Status: DC | PRN
  Administered 2016-07-06 (×6): 25 ug via INTRAVENOUS
  Administered 2016-07-06: 50 ug via INTRAVENOUS
  Administered 2016-07-06 (×4): 25 ug via INTRAVENOUS

## 2016-07-06 MED ORDER — PROPOFOL 10 MG/ML IV BOLUS
INTRAVENOUS | Status: AC
  Filled 2016-07-06: qty 20

## 2016-07-06 MED ORDER — SODIUM CHLORIDE 0.9 % IV SOLN
INTRAVENOUS | Status: DC | PRN
  Administered 2016-07-06: 60 mL

## 2016-07-06 MED ORDER — HYDROMORPHONE HCL 1 MG/ML IJ SOLN
INTRAMUSCULAR | Status: AC
  Filled 2016-07-06: qty 2

## 2016-07-06 MED ORDER — TRANEXAMIC ACID 1000 MG/10ML IV SOLN
INTRAVENOUS | Status: DC | PRN
  Administered 2016-07-06: 1000 mg via INTRAVENOUS

## 2016-07-06 MED ORDER — OCUVITE-LUTEIN PO CAPS
1.0000 | ORAL_CAPSULE | Freq: Every day | ORAL | Status: DC
  Administered 2016-07-07 – 2016-07-08 (×2): 1 via ORAL
  Filled 2016-07-06 (×2): qty 1

## 2016-07-06 MED ORDER — CELECOXIB 200 MG PO CAPS
200.0000 mg | ORAL_CAPSULE | Freq: Two times a day (BID) | ORAL | Status: DC
  Administered 2016-07-06 – 2016-07-08 (×4): 200 mg via ORAL
  Filled 2016-07-06 (×4): qty 1

## 2016-07-06 MED ORDER — LEVOTHYROXINE SODIUM 100 MCG PO TABS
200.0000 ug | ORAL_TABLET | Freq: Every day | ORAL | Status: DC
  Administered 2016-07-08: 200 ug via ORAL
  Filled 2016-07-06 (×2): qty 2

## 2016-07-06 MED ORDER — DIPHENHYDRAMINE HCL 12.5 MG/5ML PO ELIX: 12.5000 mg | ORAL_SOLUTION | ORAL | Status: DC | PRN

## 2016-07-06 MED ORDER — BUPIVACAINE LIPOSOME 1.3 % IJ SUSP
INTRAMUSCULAR | Status: AC
  Filled 2016-07-06: qty 20

## 2016-07-06 MED ORDER — TETRACAINE HCL 1 % IJ SOLN
INTRAMUSCULAR | Status: DC | PRN
  Administered 2016-07-06: 4 mg via INTRASPINAL

## 2016-07-06 MED ORDER — SODIUM CHLORIDE 0.9 % IV SOLN
INTRAVENOUS | Status: DC
  Administered 2016-07-06: 19:00:00 via INTRAVENOUS

## 2016-07-06 MED ORDER — CHLORHEXIDINE GLUCONATE 4 % EX LIQD: 60.0000 mL | Freq: Once | CUTANEOUS | Status: DC

## 2016-07-06 MED ORDER — MIDAZOLAM HCL 5 MG/5ML IJ SOLN
INTRAMUSCULAR | Status: DC | PRN
  Administered 2016-07-06 (×2): 2 mg via INTRAVENOUS

## 2016-07-06 MED ORDER — ALUM & MAG HYDROXIDE-SIMETH 200-200-20 MG/5ML PO SUSP: 30.0000 mL | ORAL | Status: DC | PRN

## 2016-07-06 MED ORDER — MORPHINE SULFATE (PF) 2 MG/ML IV SOLN
2.0000 mg | INTRAVENOUS | Status: DC | PRN
  Administered 2016-07-07: 2 mg via INTRAVENOUS
  Filled 2016-07-06: qty 1

## 2016-07-06 MED ORDER — ACETAMINOPHEN 10 MG/ML IV SOLN
1000.0000 mg | Freq: Once | INTRAVENOUS | Status: AC
  Administered 2016-07-06: 1000 mg via INTRAVENOUS

## 2016-07-06 MED ORDER — CEFAZOLIN SODIUM-DEXTROSE 2-4 GM/100ML-% IV SOLN
2.0000 g | Freq: Four times a day (QID) | INTRAVENOUS | Status: AC
  Administered 2016-07-06 – 2016-07-07 (×4): 2 g via INTRAVENOUS
  Filled 2016-07-06 (×4): qty 100

## 2016-07-06 MED ORDER — BISACODYL 10 MG RE SUPP
10.0000 mg | Freq: Every day | RECTAL | Status: DC | PRN
  Administered 2016-07-08: 10 mg via RECTAL
  Filled 2016-07-06: qty 1

## 2016-07-06 MED ORDER — NEOMYCIN-POLYMYXIN B GU 40-200000 IR SOLN
Status: AC
  Filled 2016-07-06: qty 20

## 2016-07-06 MED ORDER — HYDROMORPHONE HCL 1 MG/ML IJ SOLN
INTRAMUSCULAR | Status: DC | PRN
  Administered 2016-07-06 (×3): .2 mg via INTRAVENOUS

## 2016-07-06 MED ORDER — TRIAMTERENE-HCTZ 37.5-25 MG PO TABS
1.0000 | ORAL_TABLET | Freq: Every day | ORAL | Status: DC
  Administered 2016-07-07 – 2016-07-08 (×2): 1 via ORAL
  Filled 2016-07-06 (×2): qty 1

## 2016-07-06 MED ORDER — SODIUM CHLORIDE 0.9 % IJ SOLN
INTRAMUSCULAR | Status: AC
  Filled 2016-07-06: qty 50

## 2016-07-06 MED ORDER — ACETAMINOPHEN 325 MG PO TABS: 650.0000 mg | ORAL_TABLET | Freq: Four times a day (QID) | ORAL | Status: DC | PRN

## 2016-07-06 SURGICAL SUPPLY — 63 items
BATTERY INSTRU NAVIGATION (MISCELLANEOUS) ×12 IMPLANT
BLADE SAW 1 (BLADE) ×3 IMPLANT
BLADE SAW 1/2 (BLADE) ×3 IMPLANT
BLADE SAW 70X12.5 (BLADE) IMPLANT
BONE CEMENT GENTAMICIN (Cement) ×6 IMPLANT
CANISTER SUCT 1200ML W/VALVE (MISCELLANEOUS) ×3 IMPLANT
CANISTER SUCT 3000ML PPV (MISCELLANEOUS) ×6 IMPLANT
CAPT KNEE TOTAL 3 ATTUNE ×3 IMPLANT
CATH TRAY METER 16FR LF (MISCELLANEOUS) ×3 IMPLANT
CEMENT BONE GENTAMICIN 40 (Cement) ×2 IMPLANT
COOLER POLAR GLACIER W/PUMP (MISCELLANEOUS) ×3 IMPLANT
CUFF TOURN 24 STER (MISCELLANEOUS) IMPLANT
CUFF TOURN 30 STER DUAL PORT (MISCELLANEOUS) ×3 IMPLANT
DRAPE SHEET LG 3/4 BI-LAMINATE (DRAPES) ×3 IMPLANT
DRSG DERMACEA 8X12 NADH (GAUZE/BANDAGES/DRESSINGS) ×3 IMPLANT
DRSG OPSITE POSTOP 4X14 (GAUZE/BANDAGES/DRESSINGS) ×3 IMPLANT
DRSG TEGADERM 4X4.75 (GAUZE/BANDAGES/DRESSINGS) ×3 IMPLANT
DURAPREP 26ML APPLICATOR (WOUND CARE) ×6 IMPLANT
ELECT CAUTERY BLADE 6.4 (BLADE) ×3 IMPLANT
ELECT REM PT RETURN 9FT ADLT (ELECTROSURGICAL) ×3
ELECTRODE REM PT RTRN 9FT ADLT (ELECTROSURGICAL) ×1 IMPLANT
EX-PIN ORTHOLOCK NAV 4X150 (PIN) ×6 IMPLANT
GLOVE BIOGEL M STRL SZ7.5 (GLOVE) ×6 IMPLANT
GLOVE BIOGEL PI IND STRL 9 (GLOVE) ×1 IMPLANT
GLOVE BIOGEL PI INDICATOR 9 (GLOVE) ×2
GLOVE INDICATOR 8.0 STRL GRN (GLOVE) ×3 IMPLANT
GLOVE SURG SYN 9.0  PF PI (GLOVE) ×2
GLOVE SURG SYN 9.0 PF PI (GLOVE) ×1 IMPLANT
GOWN STRL REUS W/ TWL LRG LVL3 (GOWN DISPOSABLE) ×2 IMPLANT
GOWN STRL REUS W/TWL 2XL LVL3 (GOWN DISPOSABLE) ×3 IMPLANT
GOWN STRL REUS W/TWL LRG LVL3 (GOWN DISPOSABLE) ×4
HEMOVAC 400CC 10FR (MISCELLANEOUS) ×3 IMPLANT
HOLDER FOLEY CATH W/STRAP (MISCELLANEOUS) ×3 IMPLANT
HOOD PEEL AWAY FLYTE STAYCOOL (MISCELLANEOUS) ×6 IMPLANT
KIT RM TURNOVER STRD PROC AR (KITS) ×3 IMPLANT
KNIFE SCULPS 14X20 (INSTRUMENTS) ×3 IMPLANT
LABEL OR SOLS (LABEL) ×3 IMPLANT
NDL SAFETY 18GX1.5 (NEEDLE) ×3 IMPLANT
NEEDLE SPNL 20GX3.5 QUINCKE YW (NEEDLE) ×3 IMPLANT
NS IRRIG 500ML POUR BTL (IV SOLUTION) ×3 IMPLANT
PACK TOTAL KNEE (MISCELLANEOUS) ×3 IMPLANT
PAD WRAPON POLAR KNEE (MISCELLANEOUS) ×1 IMPLANT
PIN DRILL QUICK PACK ×3 IMPLANT
PIN FIXATION 1/8DIA X 3INL (PIN) ×3 IMPLANT
PULSAVAC PLUS IRRIG FAN TIP (DISPOSABLE) ×2
SOL .9 NS 3000ML IRR  AL (IV SOLUTION) ×2
SOL .9 NS 3000ML IRR UROMATIC (IV SOLUTION) ×1 IMPLANT
SOL PREP PVP 2OZ (MISCELLANEOUS) ×3
SOLUTION PREP PVP 2OZ (MISCELLANEOUS) ×1 IMPLANT
SPONGE DRAIN TRACH 4X4 STRL 2S (GAUZE/BANDAGES/DRESSINGS) ×3 IMPLANT
STAPLER SKIN PROX 35W (STAPLE) ×3 IMPLANT
STRAP TIBIA SHORT (MISCELLANEOUS) ×3 IMPLANT
SUCTION FRAZIER HANDLE 10FR (MISCELLANEOUS) ×2
SUCTION TUBE FRAZIER 10FR DISP (MISCELLANEOUS) ×1 IMPLANT
SUT VIC AB 0 CT1 36 (SUTURE) ×3 IMPLANT
SUT VIC AB 1 CT1 36 (SUTURE) ×6 IMPLANT
SUT VIC AB 2-0 CT2 27 (SUTURE) ×3 IMPLANT
SYR 20CC LL (SYRINGE) ×3 IMPLANT
SYR 30ML LL (SYRINGE) ×6 IMPLANT
TIP FAN IRRIG PULSAVAC PLUS (DISPOSABLE) ×1 IMPLANT
TOWEL OR 17X26 4PK STRL BLUE (TOWEL DISPOSABLE) ×3 IMPLANT
TOWER CARTRIDGE SMART MIX (DISPOSABLE) ×3 IMPLANT
WRAPON POLAR PAD KNEE (MISCELLANEOUS) ×3

## 2016-07-06 NOTE — Anesthesia Postprocedure Evaluation (Signed)
Anesthesia Post Note  Patient: Glenda Gomez  Procedure(s) Performed: Procedure(s) (LRB): COMPUTER ASSISTED TOTAL KNEE ARTHROPLASTY (Right)  Patient location during evaluation: PACU Anesthesia Type: Spinal Level of consciousness: oriented and awake and alert Pain management: pain level controlled Vital Signs Assessment: post-procedure vital signs reviewed and stable Respiratory status: spontaneous breathing, respiratory function stable and patient connected to nasal cannula oxygen Cardiovascular status: blood pressure returned to baseline and stable Postop Assessment: no headache and no backache Anesthetic complications: no     Last Vitals:  Vitals:   07/06/16 1650 07/06/16 1655  BP: (!) 98/49 (!) 123/56  Pulse: 86 81  Resp: 19 12  Temp:  36.6 C    Last Pain:  Vitals:   07/06/16 1655  TempSrc:   PainSc: Asleep                 Molli Barrows

## 2016-07-06 NOTE — Anesthesia Procedure Notes (Signed)
Spinal  Patient location during procedure: OR Staffing Anesthesiologist: Gunnar Bulla Performed: anesthesiologist  Preanesthetic Checklist Completed: patient identified, site marked, surgical consent, pre-op evaluation, timeout performed, IV checked and risks and benefits discussed Spinal Block Patient position: sitting Prep: Betadine Patient monitoring: heart rate, cardiac monitor, continuous pulse ox and blood pressure Approach: midline Location: L3-4 Injection technique: single-shot Needle Needle type: Pencil-Tip  Needle gauge: 25 G Needle length: 9 cm Assessment Sensory level: T10

## 2016-07-06 NOTE — Op Note (Signed)
OPERATIVE NOTE  DATE OF SURGERY:  07/06/2016  PATIENT NAME:  Glenda Gomez   DOB: Oct 25, 1950  MRN: [id number]  PRE-OPERATIVE DIAGNOSIS: Degenerative arthrosis of the right knee, primary  POST-OPERATIVE DIAGNOSIS:  Same  PROCEDURE:  Right total knee arthroplasty using computer-assisted navigation  SURGEON:  Marciano Sequin. M.D.  ASSISTANT:  Rachelle Hora, PA-C (present and scrubbed throughout the case, critical for assistance with exposure, retraction, instrumentation, and closure)  ANESTHESIA: spinal  ESTIMATED BLOOD LOSS: 50 mL  FLUIDS REPLACED: 2200 mL of crystalloid  TOURNIQUET TIME: 123 minutes  DRAINS: 2 medium Hemovac drains  SOFT TISSUE RELEASES: Anterior cruciate ligament, posterior cruciate ligament, deep medial collateral ligament, patellofemoral ligament  IMPLANTS UTILIZED: DePuy Attune size 5N posterior stabilized femoral component (cemented), size 4 rotating platform tibial component (cemented), 5 mm medialized dome patella (cemented), and a 35 mm stabilized rotating platform polyethylene insert.  INDICATIONS FOR SURGERY: Glenda Gomez is a 66 y.o. year old female with a long history of progressive knee pain. X-rays demonstrated severe degenerative changes in tricompartmental fashion. The patient had not seen any significant improvement despite conservative nonsurgical intervention. After discussion of the risks and benefits of surgical intervention, the patient expressed understanding of the risks benefits and agree with plans for total knee arthroplasty.   The risks, benefits, and alternatives were discussed at length including but not limited to the risks of infection, bleeding, nerve injury, stiffness, blood clots, the need for revision surgery, cardiopulmonary complications, among others, and they were willing to proceed.  PROCEDURE IN DETAIL: The patient was brought into the operating room and, after adequate spinal anesthesia was achieved, a tourniquet was  placed on the patient's upper thigh. The patient's knee and leg were cleaned and prepped with alcohol and DuraPrep and draped in the usual sterile fashion. A "timeout" was performed as per usual protocol. The lower extremity was exsanguinated using an Esmarch, and the tourniquet was inflated to 300 mmHg. An anterior longitudinal incision was made followed by a standard mid vastus approach. The deep fibers of the medial collateral ligament were elevated in a subperiosteal fashion off of the medial flare of the tibia so as to maintain a continuous soft tissue sleeve. The patella was subluxed laterally and the patellofemoral ligament was incised. Inspection of the knee demonstrated severe degenerative changes with full-thickness loss of articular cartilage. Osteophytes were debrided using a rongeur. Anterior and posterior cruciate ligaments were excised. Two 4.0 mm Schanz pins were inserted in the femur and into the tibia for attachment of the array of trackers used for computer-assisted navigation. Hip center was identified using a circumduction technique. Distal landmarks were mapped using the computer. The distal femur and proximal tibia were mapped using the computer. The distal femoral cutting guide was positioned using computer-assisted navigation so as to achieve a 5 distal valgus cut. The femur was sized and it was felt that a size 5N femoral component was appropriate. A size 5 femoral cutting guide was positioned and the anterior cut was performed and verified using the computer. This was followed by completion of the posterior and chamfer cuts. Femoral cutting guide for the central box was then positioned in the center box cut was performed.  Attention was then directed to the proximal tibia. Medial and lateral menisci were excised. The extramedullary tibial cutting guide was positioned using computer-assisted navigation so as to achieve a 0 varus-valgus alignment and 3 posterior slope. The cut was  performed and verified using the computer. The proximal tibia  was sized and it was felt that a size 4 tibial tray was appropriate. Tibial and femoral trials were inserted followed by insertion of a 5 mm polyethylene insert. The knee was felt to be tight laterally. The trial components removed and the knee was brought into full extension and distracted using the Moreland retractors. The posterior lateral corner was carefully released using combination of electrocautery and Metzenbaum scissors. Trial components were reinserted and the knee was placed range of motion. This allowed for excellent mediolateral soft tissue balancing both in flexion and in full extension. Finally, the patella was cut and prepared so as to accommodate a 35 mm medialized dome patella. A patella trial was placed and the knee was placed through a range of motion with excellent patellar tracking appreciated. The femoral trial was removed after debridement of posterior osteophytes. The central post-hole for the tibial component was reamed followed by insertion of a keel punch. Tibial trials were then removed. Cut surfaces of bone were irrigated with copious amounts of normal saline with antibiotic solution using pulsatile lavage and then suctioned dry. Polymethylmethacrylate cement with gentamicin was prepared in the usual fashion using a vacuum mixer. Cement was applied to the cut surface of the proximal tibia as well as along the undersurface of a size 5 rotating platform tibial component. Tibial component was positioned and impacted into place. Excess cement was removed using [employer] fast streamer. Cement was then applied to the cut surfaces of the femur as well as along the posterior flanges of the size 5N femoral component. The femoral component was positioned and impacted into place. Excess cement was removed using [employer] fast streamer. A 5 mm polyethylene trial was inserted and the knee was brought into full extension with steady axial compression  applied. Finally, cement was applied to the backside of a 35 mm medialized dome patella and the patellar component was positioned and patellar clamp applied. Excess cement was removed using [employer] fast streamer. After adequate curing of the cement, the tourniquet was deflated after a total tourniquet time of 123 minutes. Hemostasis was achieved using electrocautery. The knee was irrigated with copious amounts of normal saline with antibiotic solution using pulsatile lavage and then suctioned dry. 20 mL of 1.3% Exparel and 60 mL of 0.25% Marcaine in 40 mL of normal saline was injected along the posterior capsule, medial and lateral gutters, and along the arthrotomy site. A 5 mm stabilized rotating platform polyethylene insert was inserted and the knee was placed through a range of motion with excellent mediolateral soft tissue balancing appreciated and excellent patellar tracking noted. 2 medium drains were placed in the wound bed and brought out through separate stab incisions to be attached to a reinfusion system. The medial parapatellar portion of the incision was reapproximated using interrupted sutures of #1 Vicryl. Subcutaneous tissue was approximated in layers using first #0 Vicryl followed #2-0 Vicryl. The skin was approximated with skin staples. A sterile dressing was applied.  The patient tolerated the procedure well and was transported to the recovery room in stable condition.    Helayne Metsker P. Holley Bouche., M.D.

## 2016-07-06 NOTE — Anesthesia Preprocedure Evaluation (Addendum)
Anesthesia Evaluation  Patient identified by MRN, date of birth, ID band Patient awake    Reviewed: Allergy & Precautions, NPO status , Patient's Chart, lab work & pertinent test results, reviewed documented beta blocker date and time   Airway Mallampati: III  TM Distance: >3 FB     Dental  (+) Chipped   Pulmonary sleep apnea ,           Cardiovascular hypertension, Pt. on medications      Neuro/Psych  Neuromuscular disease    GI/Hepatic GERD  Controlled,  Endo/Other  Hypothyroidism   Renal/GU      Musculoskeletal  (+) Arthritis , Fibromyalgia -  Abdominal   Peds  Hematology   Anesthesia Other Findings Obese. Gastric bypass. Has PACs.  Reproductive/Obstetrics                            Anesthesia Physical Anesthesia Plan  ASA: III  Anesthesia Plan: Spinal   Post-op Pain Management:    Induction:   Airway Management Planned:   Additional Equipment:   Intra-op Plan:   Post-operative Plan:   Informed Consent: I have reviewed the patients History and Physical, chart, labs and discussed the procedure including the risks, benefits and alternatives for the proposed anesthesia with the patient or authorized representative who has indicated his/her understanding and acceptance.     Plan Discussed with: CRNA  Anesthesia Plan Comments:         Anesthesia Quick Evaluation

## 2016-07-06 NOTE — Progress Notes (Signed)
Anticoagulation monitoring(Lovenox):  66 yo female ordered Lovenox 30 mg Q12h  Filed Weights   07/06/16 1024  Weight: 230 lb (104.3 kg)   BMI 40.8   Lab Results  Component Value Date   CREATININE 0.82 06/22/2016   Estimated Creatinine Clearance: 80.1 mL/min (by C-G formula based on SCr of 0.82 mg/dL). Hemoglobin & Hematocrit     Component Value Date/Time   HGB 13.7 06/22/2016 1231   HCT 41.1 06/22/2016 1231     Per Protocol for Patient with estCrcl > 30 ml/min and BMI > 40, will transition to Lovenox 40 mg Q12h.

## 2016-07-06 NOTE — Transfer of Care (Signed)
Immediate Anesthesia Transfer of Care Note  Patient: Glenda Gomez  Procedure(s) Performed: Procedure(s): COMPUTER ASSISTED TOTAL KNEE ARTHROPLASTY (Right)  Patient Location: PACU  Anesthesia Type:Spinal  Level of Consciousness: awake and patient cooperative  Airway & Oxygen Therapy: Patient Spontanous Breathing and Patient connected to face mask oxygen  Post-op Assessment: Report given to RN and Post -op Vital signs reviewed and stable  Post vital signs: Reviewed and stable  Last Vitals:  Vitals:   07/06/16 1024 07/06/16 1600  BP: (!) 141/68 (!) 144/73  Pulse: 62 74  Resp: 18 12  Temp: 36.8 C 36.7 C    Last Pain:  Vitals:   07/06/16 1024  TempSrc: Oral  PainSc: 5          Complications: No apparent anesthesia complications

## 2016-07-06 NOTE — Anesthesia Post-op Follow-up Note (Cosign Needed)
Anesthesia QCDR form completed.        

## 2016-07-06 NOTE — H&P (Signed)
The patient has been re-examined, and the chart reviewed, and there have been no interval changes to the documented history and physical.    The risks, benefits, and alternatives have been discussed at length. The patient expressed understanding of the risks benefits and agreed with plans for surgical intervention.  Barbee Mamula P. Marcellas Marchant, Jr. M.D.    

## 2016-07-07 ENCOUNTER — Encounter: Payer: Self-pay | Admitting: Orthopedic Surgery

## 2016-07-07 LAB — BASIC METABOLIC PANEL
Anion gap: 7 (ref 5–15)
BUN: 8 mg/dL (ref 6–20)
CALCIUM: 8.5 mg/dL — AB (ref 8.9–10.3)
CHLORIDE: 100 mmol/L — AB (ref 101–111)
CO2: 30 mmol/L (ref 22–32)
Creatinine, Ser: 0.65 mg/dL (ref 0.44–1.00)
GFR calc non Af Amer: >60 mL/min (ref >60)
GLUCOSE: 111 mg/dL — AB (ref 65–99)
Potassium: 3.6 mmol/L (ref 3.5–5.1)
Sodium: 137 mmol/L (ref 135–145)

## 2016-07-07 LAB — CBC
HEMATOCRIT: 38.7 % (ref 35.0–47.0)
HEMOGLOBIN: 12.7 g/dL (ref 12.0–16.0)
MCH: 25.6 pg — AB (ref 26.0–34.0)
MCHC: 32.8 g/dL (ref 32.0–36.0)
MCV: 77.8 fL — ABNORMAL LOW (ref 80.0–100.0)
Platelets: 282 10*3/uL (ref 150–440)
RBC: 4.97 MIL/uL (ref 3.80–5.20)
RDW: 15.1 % — AB (ref 11.5–14.5)
WBC: 9.9 10*3/uL (ref 3.6–11.0)

## 2016-07-07 NOTE — Progress Notes (Signed)
  Subjective: 1 Day Post-Op Procedure(s) (LRB): COMPUTER ASSISTED TOTAL KNEE ARTHROPLASTY (Right) Patient reports pain as moderate.   Patient seen in rounds with Dr. Marry Guan. Patient is well, and has had no acute complaints or problems.  The patient was having more pain last night, but is becoming more controlled. Plan is to go Home after hospital stay. Negative for chest pain and shortness of breath Fever: no Gastrointestinal: Negative for nausea and vomiting  Objective: Vital signs in last 24 hours: Temp:  [97.6 F (36.4 C)-99.5 F (37.5 C)] 99.5 F (37.5 C) (05/24 0420) Pulse Rate:  [58-91] 75 (05/24 0420) Resp:  [10-20] 19 (05/24 0420) BP: (97-156)/(49-76) 144/65 (05/24 0420) SpO2:  [95 %-100 %] 96 % (05/24 0420) Weight:  [104.3 kg (230 lb)] 104.3 kg (230 lb) (05/23 1024)  Intake/Output from previous day:  Intake/Output Summary (Last 24 hours) at 07/07/16 0604 Last data filed at 07/07/16 0543  Gross per 24 hour  Intake          4103.33 ml  Output             1815 ml  Net          2288.33 ml    Intake/Output this shift: Total I/O In: 1493.3 [I.V.:1093.3; IV Piggyback:400] Out: 1080 [Urine:950; Drains:130]  Labs:  Recent Labs  07/07/16 0424  HGB 12.7    Recent Labs  07/07/16 0424  WBC 9.9  RBC 4.97  HCT 38.7  PLT 282    Recent Labs  07/07/16 0424  NA 137  K 3.6  CL 100*  CO2 30  BUN 8  CREATININE 0.65  GLUCOSE 111*  CALCIUM 8.5*   No results for input(s): LABPT, INR in the last 72 hours.   EXAM General - Patient is Alert and Oriented Extremity - Sensation intact distally Dorsiflexion/Plantar flexion intact Compartment soft Dressing/Incision - clean, dry, no drainage Motor Function - intact, moving foot and toes well on exam. The patient can do a mild straight leg raise with very minimal assistance.  Past Medical History:  Diagnosis Date  . Arthritis   . Chicken pox   . Fibromyalgia   . GERD (gastroesophageal reflux disease)   .  Hemorrhoid   . Hypersomnia   . Hypertension   . Hypothyroidism   . Myositis ossificans   . Osteoarthritis   . Sleep apnea    Patient denies.  . Thyroid disease     Assessment/Plan: 1 Day Post-Op Procedure(s) (LRB): COMPUTER ASSISTED TOTAL KNEE ARTHROPLASTY (Right) Active Problems:   S/P total knee arthroplasty  Estimated body mass index is 40.74 kg/m as calculated from the following:   Height as of this encounter: 5\' 3"  (1.6 m).   Weight as of this encounter: 104.3 kg (230 lb). Advance diet Up with therapy D/C IV fluids Plan for discharge tomorrow  DVT Prophylaxis - Lovenox, Foot Pumps and TED hose Weight-Bearing as tolerated to right leg  Reche Dixon, PA-C Orthopaedic Surgery 07/07/2016, 6:04 AM

## 2016-07-07 NOTE — Care Management Important Message (Signed)
Important Message  Patient Details  Name: LYNNEA VANDERVOORT MRN: [id number] Date of Birth: 17-Jun-1950   Medicare Important Message Given:  Yes    Jolly Mango, RN 07/07/2016, 10:54 AM

## 2016-07-07 NOTE — Care Management Note (Signed)
Case Management Note  Patient Details  Name: Glenda Gomez MRN: [id number] Date of Birth: [birth year]  Subjective/Objective:  POD # 1 right TKA. Met with patient at bedside to discuss discharge planning. Patient ill be going to live with her son for 2 weeks post op.   His address: [address] [place], [place] She will have her cell as contact.   Patient has a walker, denies the need for a BSC.  Offered choice of home health agencies. Referral to [facility]. Pharmacy; [facility] [place] [phone]. Called Lovenox 40 mg # 14 no refills.                     Action/Plan: No DME needs, [facility] for PT, Lovenox called in.   Expected Discharge Date:                  Expected Discharge Plan:  [place]  In-House Referral:     Discharge planning Services  CM Consult  Post Acute Care Choice:  Home Health Choice offered to:  Patient  DME Arranged:    DME Agency:     HH Arranged:  PT [place]:  [facility] ([facility])  Status of Service:  In process, will continue to follow  If discussed at Long Length of Stay Meetings, dates discussed:    Additional Comments:  Jolly Mango, RN 07/07/2016, 11:11 AM

## 2016-07-07 NOTE — Evaluation (Signed)
Physical Therapy Evaluation Patient Details Name: Glenda Gomez MRN: [id number] DOB: May 24, 1950 Today's Date: 07/07/2016   History of Present Illness  66 y/o female s/p R TKA 07/06/16.  She had L knee replaced in 2006, had issues with scar tissue that was surgically abated in '08.  Clinical Impression  Despite c/o pain t/o the session pt did quite well and showed mobility and strength enough to be able to go home (son's home, she lives alone typically) with HHPT.  She was able to do 10 SLRs and achieve ROM 0-88.  She participate with ~15 minutes of exercises with good effort (apart from exam) and followed instructions for gait training well going ~65 ft with FWW and CGA.  Pt was on O2 on PTs arrival, but stayed 93-96% the entire session on room air, notified nursing.  Pt doing well, eager to participate despite c/o pain.    Follow Up Recommendations Home health PT    Equipment Recommendations       Recommendations for Other Services       Precautions / Restrictions Precautions Precautions: Fall Restrictions Weight Bearing Restrictions: Yes RLE Weight Bearing: Weight bearing as tolerated      Mobility  Bed Mobility Overal bed mobility: Independent             General bed mobility comments: Pt able to get herself up to sitting EOB w/o assist  Transfers Overall transfer level: Modified independent Equipment used: Rolling walker (2 wheeled)             General transfer comment: Pt able to rise to standing and control descent to recliner with light UE use and good confidence.   Ambulation/Gait Ambulation/Gait assistance: Supervision Ambulation Distance (Feet): 65 Feet Assistive device: Rolling walker (2 wheeled)       General Gait Details: Pt is able to ambulate w/o excessive UE use on walker.  She reports some pain and limitations but generally was safe and showed ability to take weight on the R w/o buckling or excessive pain.  No LOBs and no safety issues.    Stairs            Wheelchair Mobility    Modified Rankin (Stroke Patients Only)       Balance Overall balance assessment: Modified Independent                                           Pertinent Vitals/Pain Pain Assessment: 0-10 Pain Score: 6  Pain Location: R knee, pain increases with ROM and exercise activities    Home Living Family/patient expects to be discharged to:: Private residence Living Arrangements: Alone (Pt will go to son's home for 2 weeks upon discharge) Available Help at Discharge: Home health   Home Access: Stairs to enter Entrance Stairs-Rails: Left (wall on R) Entrance Stairs-Number of Steps: 5 (at son's home)   Home Equipment: Walker - 2 wheels      Prior Function Level of Independence: Independent         Comments: Pt reports that she typically can do all she needs w/o AD, runs errands, etc     Hand Dominance        Extremity/Trunk Assessment   Upper Extremity Assessment Upper Extremity Assessment: Overall WFL for tasks assessed    Lower Extremity Assessment Lower Extremity Assessment:  (expected post-op weakness, grossly functional, 10 SLRs on R)  Communication   Communication: No difficulties  Cognition Arousal/Alertness: Awake/alert Behavior During Therapy: WFL for tasks assessed/performed Overall Cognitive Status: Within Functional Limits for tasks assessed                                        General Comments      Exercises Total Joint Exercises Ankle Circles/Pumps: AROM;10 reps Quad Sets: Strengthening;10 reps Gluteal Sets: Strengthening;10 reps Heel Slides: AROM;Strengthening;10 reps Hip ABduction/ADduction: Strengthening;AROM;10 reps Straight Leg Raises: AROM;10 reps Knee Flexion: PROM;5 reps Goniometric ROM: 0-88   Assessment/Plan    PT Assessment Patient needs continued PT services  PT Problem List Decreased range of motion;Decreased strength;Decreased  activity tolerance;Decreased balance;Decreased mobility;Decreased coordination;Decreased knowledge of use of DME;Decreased safety awareness;Pain       PT Treatment Interventions DME instruction;Gait training;Stair training;Functional mobility training;Therapeutic activities;Therapeutic exercise;Balance training;Neuromuscular re-education;Patient/family education    PT Goals (Current goals can be found in the Care Plan section)  Acute Rehab PT Goals Patient Stated Goal: do better with this TKA than the last PT Goal Formulation: With patient Time For Goal Achievement: 07/21/16 Potential to Achieve Goals: Good    Frequency BID   Barriers to discharge        Co-evaluation               AM-PAC PT "6 Clicks" Daily Activity  Outcome Measure Difficulty turning over in bed (including adjusting bedclothes, sheets and blankets)?: None Difficulty moving from lying on back to sitting on the side of the bed? : A Little Difficulty sitting down on and standing up from a chair with arms (e.g., wheelchair, bedside commode, etc,.)?: A Little Help needed moving to and from a bed to chair (including a wheelchair)?: None Help needed walking in hospital room?: None Help needed climbing 3-5 steps with a railing? : A Lot 6 Click Score: 20    End of Session Equipment Utilized During Treatment: Gait belt Activity Tolerance: Patient tolerated treatment well (pt c/o pain the entire time but did not seem overly limited ) Patient left: with bed alarm set;with call bell/phone within reach   PT Visit Diagnosis: Muscle weakness (generalized) (M62.81);Difficulty in walking, not elsewhere classified (R26.2)    Time: 2229-7989 PT Time Calculation (min) (ACUTE ONLY): 50 min   Charges:   PT Evaluation $PT Eval Low Complexity: 1 Procedure PT Treatments $Gait Training: 8-22 mins $Therapeutic Exercise: 8-22 mins   PT G Codes:        Kreg Shropshire, DPT 07/07/2016, 10:14 AM

## 2016-07-07 NOTE — Progress Notes (Signed)
Clinical Social Worker (CSW) received SNF consult. PT is recommending home health. RN case manager aware of above. Please reconsult if future social work needs arise. CSW signing off.   Maddox Bratcher, LCSW [phone] 

## 2016-07-07 NOTE — Progress Notes (Signed)
Physical Therapy Treatment Patient Details Name: Glenda Gomez MRN: [id number] DOB: 12-23-1950 Today's Date: 07/07/2016    History of Present Illness 66 y/o female s/p R TKA 07/06/16.  She had L knee replaced in 2006, had issues with scar tissue that was surgically abated in '08.     PT Comments    Pt did well with PT this afternoon and was able to increase walking distance, negotiate steps, achieve >90 degrees of flexion, and with minimal warm up did 10 SLRs.  She continues to c/o pain with all tasks, but was not self limiting due to this.  Pt is progressing well and willing to push herself with PT.    Follow Up Recommendations  Home health PT     Equipment Recommendations       Recommendations for Other Services       Precautions / Restrictions Precautions Precautions: Fall Restrictions Weight Bearing Restrictions: Yes RLE Weight Bearing: Weight bearing as tolerated    Mobility  Bed Mobility Overal bed mobility: Independent             General bed mobility comments: no physical assist needed, slightly more time to perform  Transfers Overall transfer level: Modified independent Equipment used: Rolling walker (2 wheeled)             General transfer comment: Pt was able to rise to standing w/o direct assist, showed good safety  Ambulation/Gait Ambulation/Gait assistance: Supervision Ambulation Distance (Feet): 75 Feet Assistive device: Rolling walker (2 wheeled)       General Gait Details: Pt continues to need to stop to use walker with each L LE WBing stance phase, but she did not have excessive fatigue or pain with longe bout of ambulation.   Stairs Stairs: Yes   Stair Management: One rail Left Number of Stairs: 4 General stair comments: Pt was able to negotiate up/down steps with b/l UEs on L rail and using somewhat sideways strategy.  Wheelchair Mobility    Modified Rankin (Stroke Patients Only)       Balance Overall balance assessment:  Modified Independent                                          Cognition Arousal/Alertness: Awake/alert Behavior During Therapy: WFL for tasks assessed/performed Overall Cognitive Status: Within Functional Limits for tasks assessed                                        Exercises Total Joint Exercises Ankle Circles/Pumps: AROM;10 reps Quad Sets: Strengthening;10 reps Gluteal Sets: Strengthening;10 reps Short Arc Quad: Strengthening;10 reps Heel Slides: AROM;Strengthening;10 reps Hip ABduction/ADduction: Strengthening;AROM;10 reps Straight Leg Raises: AROM;10 reps Knee Flexion: PROM;10 reps Goniometric ROM: >90 deg flexion Other Exercises Other Exercises: pt educated in cognitive behavioral pain coping strategies to support chronic pain management including PLB and pleasant imagery; pt verbalized understanding Other Exercises: pt educated in energy conservation strategies to support maximal functional independence and minimize falls risk and pt verbalized understanding, handout provided    General Comments General comments (skin integrity, edema, etc.): RN unhooked IV line during session after it was completed      Pertinent Vitals/Pain Pain Assessment: 0-10 Pain Score: 6  Pain Location: R knee, no increase with mobility  Pain Descriptors / Indicators: Aching Pain Intervention(s):  Limited activity within patient's tolerance;Premedicated before session;Monitored during session;Repositioned;Ice applied    Home Living Family/patient expects to be discharged to:: Private residence Living Arrangements: Alone (pt plans to stay with son for 2 weeks post op) Available Help at Discharge: Home health;Family (pt's son was an EMT for 9 years) Type of Home: House Home Access: Stairs to enter Entrance Stairs-Rails: Left (wall on R) Home Layout: One level Home Equipment: Walker - 2 wheels;Cane - single point;Grab bars - tub/shower;Toilet riser;Shower  seat;Other (comment) (loftstrand crutches)      Prior Function Level of Independence: Independent      Comments: Pt reports that she typically can do all she needs w/o AD, runs errands, etc, however is limited somewhat by chronic pain in back, feet, knees   PT Goals (current goals can now be found in the care plan section) Acute Rehab PT Goals Patient Stated Goal: keep the pain under control Progress towards PT goals: Progressing toward goals    Frequency    BID      PT Plan Current plan remains appropriate    Co-evaluation              AM-PAC PT "6 Clicks" Daily Activity  Outcome Measure  Difficulty turning over in bed (including adjusting bedclothes, sheets and blankets)?: None Difficulty moving from lying on back to sitting on the side of the bed? : None Difficulty sitting down on and standing up from a chair with arms (e.g., wheelchair, bedside commode, etc,.)?: A Little Help needed moving to and from a bed to chair (including a wheelchair)?: None Help needed walking in hospital room?: None Help needed climbing 3-5 steps with a railing? : A Little 6 Click Score: 22    End of Session Equipment Utilized During Treatment: Gait belt Activity Tolerance: Patient tolerated treatment well Patient left: with bed alarm set;with call bell/phone within reach   PT Visit Diagnosis: Muscle weakness (generalized) (M62.81);Difficulty in walking, not elsewhere classified (R26.2)     Time: 7290-2111 PT Time Calculation (min) (ACUTE ONLY): 29 min  Charges:  $Gait Training: 8-22 mins $Therapeutic Exercise: 8-22 mins                    G Codes:       Kreg Shropshire, DPT 07/07/2016, 6:02 PM

## 2016-07-07 NOTE — Evaluation (Signed)
Occupational Therapy Evaluation Patient Details Name: Glenda Gomez MRN: [id number] DOB: [birth year] Today's Date: 07/07/2016    History of Present Illness 66 y/o female s/p R TKA 07/06/16.  She had L knee replaced in 2006, had issues with scar tissue that was surgically abated in '08.    Clinical Impression   Pt seen for OT evaluation this date.  Pt was independent in all ADLs prior to surgery and is eager to return to PLOF. Pt was slightly limited by chronic and severe pain prior to surgery.  Pt currently requires min assist for LB dressing while in seated position due to pain and limited AROM of R knee.  Pt would benefit from additional instruction in dressing techniques with or without assistive devices for dressing and bathing skills, recommendations for home/routines  modifications to increase safety/access in the home, falls prevention strategies, and review learned ECS and pain coping skills to ensure recall and carryover. Pt is a good candidate for HHOT following hospital stay for continued therapy services and maximize return to PLOF. Pt plans to stay with adult son for 2 weeks after the hospital. Pt's son has worked as an Public relations account executive for 9 years.        Follow Up Recommendations  Home health OT    Equipment Recommendations  Other (comment) (sock aid, leg lifter)    Recommendations for Other Services       Precautions / Restrictions Precautions Precautions: Fall Restrictions Weight Bearing Restrictions: Yes RLE Weight Bearing: Weight bearing as tolerated      Mobility Bed Mobility Overal bed mobility: Independent             General bed mobility comments: no physical assist needed, slightly more time to perform  Transfers Overall transfer level: Modified independent Equipment used: Rolling walker (2 wheeled)             General transfer comment: Pt able to rise to standing and control descent to/from toilet and EOB with light UE use and good confidence.      Balance Overall balance assessment: Modified Independent                                         ADL either performed or assessed with clinical judgement   ADL Overall ADL's : Needs assistance/impaired     Grooming: Standing;Supervision/safety Grooming Details (indicate cue type and reason): standing at sink within frame of RW, no LOB Upper Body Bathing: Set up;Sitting   Lower Body Bathing: Set up;Sitting/lateral leans;Sit to/from stand;Min guard   Upper Body Dressing : Set up;Sitting   Lower Body Dressing: Set up;Sit to/from stand;Sitting/lateral leans;Minimal assistance Lower Body Dressing Details (indicate cue type and reason): min assist to initiate over B feet; pt educated in use of AE for LB dressing, compression stocking management Toilet Transfer: Min guard;Comfort height toilet;RW;Grab bars Toilet Transfer Details (indicate cue type and reason): pt performed toilet transfer with min guard using grab bar on L side with controlled descent and no LOB, verbal cues to problem solving hand placement to maximize safety Toileting- Clothing Manipulation and Hygiene: Sit to/from stand;Min guard       Functional mobility during ADLs: Min guard;Rolling walker General ADL Comments: pt generally at supervision - min guard level for ADL and functional mobility, min assist for LB dressing with improved independence using AE and pt states that son can assist if needed  Vision Baseline Vision/History: Wears glasses Wears Glasses: At all times Patient Visual Report: No change from baseline Vision Assessment?: No apparent visual deficits     Perception     Praxis      Pertinent Vitals/Pain Pain Assessment: 0-10 Pain Score: 3  Pain Location: R knee, no increase with mobility  Pain Descriptors / Indicators: Aching Pain Intervention(s): Limited activity within patient's tolerance;Premedicated before session;Monitored during session;Repositioned;Ice applied      Hand Dominance     Extremity/Trunk Assessment Upper Extremity Assessment Upper Extremity Assessment: Overall WFL for tasks assessed   Lower Extremity Assessment Lower Extremity Assessment: Defer to PT evaluation   Cervical / Trunk Assessment Cervical / Trunk Assessment: Normal   Communication Communication Communication: No difficulties   Cognition Arousal/Alertness: Awake/alert Behavior During Therapy: WFL for tasks assessed/performed Overall Cognitive Status: Within Functional Limits for tasks assessed                                     General Comments  RN unhooked IV line during session after it was completed    Exercises Other Exercises Other Exercises: pt educated in cognitive behavioral pain coping strategies to support chronic pain management including PLB and pleasant imagery; pt verbalized understanding Other Exercises: pt educated in energy conservation strategies to support maximal functional independence and minimize falls risk and pt verbalized understanding, handout provided   Shoulder Instructions      Home Living Family/patient expects to be discharged to:: Private residence Living Arrangements: Alone (pt plans to stay with son for 2 weeks post op) Available Help at Discharge: Home health;Family (pt's son was an EMT for 9 years) Type of Home: House Home Access: Stairs to enter [employer] of Steps: 5 (at son's home) Entrance Stairs-Rails: Left (wall on R) Home Layout: One level     Bathroom Shower/Tub: Occupational psychologist: Handicapped height (std toilet with toilet riser)     Home Equipment: Walker - 2 wheels;Cane - single point;Grab bars - tub/shower;Toilet riser;Shower seat;Other (comment) (loftstrand crutches)          Prior Functioning/Environment Level of Independence: Independent        Comments: Pt reports that she typically can do all she needs w/o AD, runs errands, etc, however is limited  somewhat by chronic pain in back, feet, knees        OT Problem List: Pain;Decreased range of motion;Decreased activity tolerance;Decreased knowledge of use of DME or AE      OT Treatment/Interventions: Self-care/ADL training;Therapeutic activities;Therapeutic exercise;Energy conservation;Patient/family education;DME and/or AE instruction    OT Goals(Current goals can be found in the care plan section) Acute Rehab OT Goals Patient Stated Goal: keep the pain under control OT Goal Formulation: With patient Time For Goal Achievement: 07/21/16 Potential to Achieve Goals: Good  OT Frequency: Min 2X/week   Barriers to D/C:            Co-evaluation              AM-PAC PT "6 Clicks" Daily Activity     Outcome Measure Help from another person eating meals?: None Help from another person taking care of personal grooming?: A Little Help from another person toileting, which includes using toliet, bedpan, or urinal?: A Little Help from another person bathing (including washing, rinsing, drying)?: A Little Help from another person to put on and taking off regular upper body clothing?: None Help from  another person to put on and taking off regular lower body clothing?: A Little 6 Click Score: 20   End of Session Equipment Utilized During Treatment: Gait belt;Rolling walker  Activity Tolerance: Patient tolerated treatment well Patient left: in bed;with call bell/phone within reach;with bed alarm set;with nursing/sitter in room;with SCD's reapplied;Other (comment) (polar care in place)  OT Visit Diagnosis: Other abnormalities of gait and mobility (R26.89);Pain Pain - Right/Left: Right Pain - part of body: Knee;Ankle and joints of foot (both feet (chronic))                Time: 1401-1459 OT Time Calculation (min): 58 min Charges:  OT General Charges $OT Visit: 1 Procedure OT Evaluation $OT Eval Low Complexity: 1 Procedure OT Treatments $Self Care/Home Management : 38-52  mins G-Codes:     Jeni Salles, MPH, MS, OTR/L ascom [phone] 07/07/16, 3:22 PM

## 2016-07-07 NOTE — Discharge Instructions (Signed)
TOTAL KNEE REPLACEMENT POSTOPERATIVE DIRECTIONS  Knee Rehabilitation, Guidelines Following Surgery  Results after knee surgery are often greatly improved when you follow the exercise, range of motion and muscle strengthening exercises prescribed by your doctor. Safety measures are also important to protect the knee from further injury. Any time any of these exercises cause you to have increased pain or swelling in your knee joint, decrease the amount until you are comfortable again and slowly increase them. If you have problems or questions, call your caregiver or physical therapist for advice.   HOME CARE INSTRUCTIONS  Remove items at home which could result in a fall. This includes throw rugs or furniture in walking pathways.   ICE using the Polar Care unit to the affected knee every three hours for 30 minutes at a time and then as needed for pain and swelling.  Place a dry towel or pillow case over the knee before applying the Polar Care Unit.  Continue to use ice on the knee for pain and swelling from surgery. You may notice swelling that will progress down to the foot and ankle.  This is normal after surgery.  Elevate the leg when you are not up walking on it.    Continue to use the breathing machine which will help keep your temperature down.  It is common for your temperature to cycle up and down following surgery, especially at night when you are not up moving around and exerting yourself.  The breathing machine keeps your lungs expanded and your temperature down.  Do not place pillow under knee, focus on keeping the knee straight while resting  DIET You may resume your previous home diet once your are discharged from the hospital.  DRESSING / WOUND CARE / SHOWERING Keep the surgical dressing until follow up.  The dressing is water proof, so you can shower without any extra covering.  IF THE DRESSING FALLS OFF or the wound gets wet inside, change the dressing with sterile gauze.  Please use  good hand washing techniques before changing the dressing.  Do not use any lotions or creams on the incision until instructed by your surgeon.   You need to keep your wound dry after being discharged home.  Just keep the incision dry and apply a dry gauze dressing on daily. Change the surgical dressing only if needed and reapply a dry dressing each time.  ACTIVITY Walk with your walker as instructed. Use walker as long as suggested by your caregivers. Avoid periods of inactivity such as sitting longer than an hour when not asleep. This helps prevent blood clots.  You may resume a sexual relationship in one month or when given the OK by your doctor.  You may return to work once you are cleared by your doctor.  Do not drive a car for 6 weeks or until released by you surgeon.  Do not drive while taking narcotics.  WEIGHT BEARING Weight bearing as tolerated with assist device (walker, cane, etc) as directed, use it as long as suggested by your surgeon or therapist, typically at least 4-6 weeks.  POSTOPERATIVE CONSTIPATION PROTOCOL Constipation - defined medically as fewer than three stools per week and severe constipation as less than one stool per week.  One of the most common issues patients have following surgery is constipation.  Even if you have a regular bowel pattern at home, your normal regimen is likely to be disrupted due to multiple reasons following surgery.  Combination of anesthesia, postoperative narcotics, change in  appetite and fluid intake all can affect your bowels.  In order to avoid complications following surgery, here are some recommendations in order to help you during your recovery period.  Colace (docusate) - Pick up an over-the-counter form of Colace or another stool softener and take twice a day as long as you are requiring postoperative pain medications.  Take with a full glass of water daily.  If you experience loose stools or diarrhea, hold the colace until you stool  forms back up.  If your symptoms do not get better within 1 week or if they get worse, check with your doctor.  Dulcolax (bisacodyl) - Pick up over-the-counter and take as directed by the product packaging as needed to assist with the movement of your bowels.  Take with a full glass of water.  Use this product as needed if not relieved by Colace only.   MiraLax (polyethylene glycol) - Pick up over-the-counter to have on hand.  MiraLax is a solution that will increase the amount of water in your bowels to assist with bowel movements.  Take as directed and can mix with a glass of water, juice, soda, coffee, or tea.  Take if you go more than two days without a movement. Do not use MiraLax more than once per day. Call your doctor if you are still constipated or irregular after using this medication for 7 days in a row.  If you continue to have problems with postoperative constipation, please contact the office for further assistance and recommendations.  If you experience "the worst abdominal pain ever" or develop nausea or vomiting, please contact the office immediatly for further recommendations for treatment.  ITCHING  If you experience itching with your medications, try taking only a single pain pill, or even half a pain pill at a time.  You can also use Benadryl over the counter for itching or also to help with sleep.   TED HOSE STOCKINGS Wear the elastic stockings on both legs for six weeks following surgery during the day but you may remove then at night for sleeping.  MEDICATIONS See your medication summary on the After Visit Summary that the nursing staff will review with you prior to discharge.  You may have some home medications which will be placed on hold until you complete the course of blood thinner medication.  It is important for you to complete the blood thinner medication as prescribed by your surgeon.  Continue your approved medications as instructed at time of  discharge.  PRECAUTIONS If you experience chest pain or shortness of breath - call 911 immediately for transfer to the hospital emergency department.  If you develop a fever greater that 101 F, purulent drainage from wound, increased redness or drainage from wound, foul odor from the wound/dressing, or calf pain - CONTACT YOUR SURGEON.                                                   FOLLOW-UP APPOINTMENTS Make sure you keep all of your appointments after your operation with your surgeon and caregivers. You should call the office at the above phone number and make an appointment for approximately two weeks after the date of your surgery or on the date instructed by your surgeon outlined in the "After Visit Summary".   RANGE OF MOTION AND STRENGTHENING EXERCISES  Rehabilitation  of the knee is important following a knee injury or an operation. After just a few days of immobilization, the muscles of the thigh which control the knee become weakened and shrink (atrophy). Knee exercises are designed to build up the tone and strength of the thigh muscles and to improve knee motion. Often times heat used for twenty to thirty minutes before working out will loosen up your tissues and help with improving the range of motion but do not use heat for the first two weeks following surgery. These exercises can be done on a training (exercise) mat, on the floor, on a table or on a bed. Use what ever works the best and is most comfortable for you Knee exercises include:  Leg Lifts - While your knee is still immobilized in a splint or cast, you can do straight leg raises. Lift the leg to 60 degrees, hold for 3 sec, and slowly lower the leg. Repeat 10-20 times 2-3 times daily. Perform this exercise against resistance later as your knee gets better.  Quad and Hamstring Sets - Tighten up the muscle on the front of the thigh (Quad) and hold for 5-10 sec. Repeat this 10-20 times hourly. Hamstring sets are done by pushing the  foot backward against an object and holding for 5-10 sec. Repeat as with quad sets.   Leg Slides: Lying on your back, slowly slide your foot toward your buttocks, bending your knee up off the floor (only go as far as is comfortable). Then slowly slide your foot back down until your leg is flat on the floor again.  Angel Wings: Lying on your back spread your legs to the side as far apart as you can without causing discomfort.  A rehabilitation program following serious knee injuries can speed recovery and prevent re-injury in the future due to weakened muscles. Contact your doctor or a physical therapist for more information on knee rehabilitation.   IF YOU ARE TRANSFERRED TO A SKILLED REHAB FACILITY If the patient is transferred to a skilled rehab facility following release from the hospital, a list of the current medications will be sent to the facility for the patient to continue.  When discharged from the skilled rehab facility, please have the facility set up the patient's [place] prior to being released. Also, the skilled facility will be responsible for providing the patient with their medications at time of release from the facility to include their pain medication, the muscle relaxants, and their blood thinner medication. If the patient is still at the rehab facility at time of the two week follow up appointment, the skilled rehab facility will also need to assist the patient in arranging follow up appointment in our office and any transportation needs.  MAKE SURE YOU:  Understand these instructions.  Get help right away if you are not doing well or get worse.    Pick up stool softner and laxative for home use following surgery while on pain medications. Do not submerge incision under water. Please use good hand washing techniques while changing dressing each day. May shower starting three days after surgery. Please use a clean towel to pat the incision dry following  showers. Continue to use ice for pain and swelling after surgery. Do not use any lotions or creams on the incision until instructed by your surgeon.

## 2016-07-08 LAB — BASIC METABOLIC PANEL
Anion gap: 6 (ref 5–15)
BUN: 10 mg/dL (ref 6–20)
CALCIUM: 8.5 mg/dL — AB (ref 8.9–10.3)
CO2: 31 mmol/L (ref 22–32)
Chloride: 98 mmol/L — ABNORMAL LOW (ref 101–111)
Creatinine, Ser: 0.64 mg/dL (ref 0.44–1.00)
GFR calc non Af Amer: >60 mL/min (ref >60)
GLUCOSE: 102 mg/dL — AB (ref 65–99)
POTASSIUM: 3.5 mmol/L (ref 3.5–5.1)
Sodium: 135 mmol/L (ref 135–145)

## 2016-07-08 LAB — CBC
HEMATOCRIT: 37.6 % (ref 35.0–47.0)
HEMOGLOBIN: 12.2 g/dL (ref 12.0–16.0)
MCH: 25.3 pg — AB (ref 26.0–34.0)
MCHC: 32.5 g/dL (ref 32.0–36.0)
MCV: 77.8 fL — ABNORMAL LOW (ref 80.0–100.0)
Platelets: 285 10*3/uL (ref 150–440)
RBC: 4.84 MIL/uL (ref 3.80–5.20)
RDW: 14.9 % — ABNORMAL HIGH (ref 11.5–14.5)
WBC: 10.8 10*3/uL (ref 3.6–11.0)

## 2016-07-08 MED ORDER — ENOXAPARIN SODIUM 40 MG/0.4ML ~~LOC~~ SOLN: 40.0000 mg | Freq: Two times a day (BID) | SUBCUTANEOUS | 0 refills | Status: DC

## 2016-07-08 MED ORDER — TRAMADOL HCL 50 MG PO TABS: 50.0000 mg | ORAL_TABLET | Freq: Three times a day (TID) | ORAL | 1 refills | Status: DC | PRN

## 2016-07-08 MED ORDER — OXYCODONE HCL 5 MG PO TABS: 5.0000 mg | ORAL_TABLET | ORAL | 0 refills | Status: DC | PRN

## 2016-07-08 MED ORDER — CELECOXIB 200 MG PO CAPS: 200.0000 mg | ORAL_CAPSULE | Freq: Two times a day (BID) | ORAL | 1 refills | Status: DC

## 2016-07-08 NOTE — Care Management Note (Signed)
Case Management Note  Patient Details  Name: Glenda Gomez MRN: [id number] Date of Birth: 13-Mar-1950  Subjective/Objective:   Discharging today                 Action/Plan: [facility] notified of discharge. Lovenox covered under Medicaid for $ 3.   Expected Discharge Date:  07/08/16               Expected Discharge Plan:  [place]  In-House Referral:     Discharge planning Services  CM Consult  Post Acute Care Choice:  Home Health Choice offered to:  Patient  DME Arranged:    DME Agency:     HH Arranged:  PT [place]:  [facility] (now [facility])  Status of Service:  In process, will continue to follow  If discussed at Long Length of Stay Meetings, dates discussed:    Additional Comments:  Jolly Mango, RN 07/08/2016, 9:07 AM

## 2016-07-08 NOTE — Progress Notes (Signed)
  Subjective: 2 Days Post-Op Procedure(s) (LRB): COMPUTER ASSISTED TOTAL KNEE ARTHROPLASTY (Right) Patient reports pain as moderate.   Patient seen in rounds with Dr. Marry Guan. Patient is well, and has had no acute complaints or problems.   Plan is to go Home today with home health physical therapy. Negative for chest pain and shortness of breath Fever: no Gastrointestinal: Negative for nausea and vomiting  Objective: Vital signs in last 24 hours: Temp:  [98.2 F (36.8 C)-98.5 F (36.9 C)] 98.2 F (36.8 C) (05/24 2130) Pulse Rate:  [63-76] 76 (05/24 2130) Resp:  [18-19] 19 (05/24 2130) BP: (116-144)/(59-62) 116/59 (05/24 2130) SpO2:  [91 %-96 %] 91 % (05/24 2130)  Intake/Output from previous day:  Intake/Output Summary (Last 24 hours) at 07/08/16 0620 Last data filed at 07/07/16 2134  Gross per 24 hour  Intake          1028.33 ml  Output              630 ml  Net           398.33 ml    Intake/Output this shift: Total I/O In: -  Out: 80 [Drains:80]  Labs:  Recent Labs  07/07/16 0424 07/08/16 0344  HGB 12.7 12.2    Recent Labs  07/07/16 0424 07/08/16 0344  WBC 9.9 10.8  RBC 4.97 4.84  HCT 38.7 37.6  PLT 282 285    Recent Labs  07/07/16 0424 07/08/16 0344  NA 137 135  K 3.6 3.5  CL 100* 98*  CO2 30 31  BUN 8 10  CREATININE 0.65 0.64  GLUCOSE 111* 102*  CALCIUM 8.5* 8.5*   No results for input(s): LABPT, INR in the last 72 hours.   EXAM General - Patient is Alert and Oriented Extremity - Sensation intact distally Dorsiflexion/Plantar flexion intact Compartment soft Dressing/Incision - clean, dry, no drainage. The Hemovac was removed. Motor Function - intact, moving foot and toes well on exam. The patient ambulated 75 feet including stairs.  Past Medical History:  Diagnosis Date  . Arthritis   . Chicken pox   . Fibromyalgia   . GERD (gastroesophageal reflux disease)   . Hemorrhoid   . Hypersomnia   . Hypertension   . Hypothyroidism   .  Myositis ossificans   . Osteoarthritis   . Sleep apnea    Patient denies.  . Thyroid disease     Assessment/Plan: 2 Days Post-Op Procedure(s) (LRB): COMPUTER ASSISTED TOTAL KNEE ARTHROPLASTY (Right) Active Problems:   S/P total knee arthroplasty  Estimated body mass index is 40.74 kg/m as calculated from the following:   Height as of this encounter: 5\' 3"  (1.6 m).   Weight as of this encounter: 104.3 kg (230 lb).  Bowel management. Physical therapy this morning. Discharge home with home health physical therapy today.  DVT Prophylaxis - Lovenox, Foot Pumps and TED hose Weight-Bearing as tolerated to right leg  Reche Dixon, PA-C Orthopaedic Surgery 07/08/2016, 6:20 AM

## 2016-07-08 NOTE — Discharge Summary (Signed)
Physician Discharge Summary  Subjective: 2 Days Post-Op Procedure(s) (LRB): COMPUTER ASSISTED TOTAL KNEE ARTHROPLASTY (Right) Patient reports pain as moderate.   Patient seen in rounds with Dr. Marry Guan. Patient is well, and has had no acute complaints or problems Patient is ready to go home with home health physical therapy.  Physician Discharge Summary  Patient ID: Glenda Gomez MRN: [id number] DOB/AGE: 1950/07/03 66 y.o.  Admit date: 07/06/2016 Discharge date: 07/08/2016  Admission Diagnoses:  Discharge Diagnoses:  Active Problems:   S/P total knee arthroplasty   Discharged Condition: fair  Hospital Course: The patient is postop day 2 from a right total knee replacement. The patient is doing well since surgery. She ambulated 75 feet with physical therapy yesterday and did stairs. She is bending the knee to 90. The patient has not had a bowel movement yet. Her labs are normal. She is ready to go home today with home health physical therapy.  Treatments: surgery:  Right total knee arthroplasty using computer-assisted navigation  SURGEON:  Marciano Sequin. M.D.  ASSISTANT:  Rachelle Hora, PA-C (present and scrubbed throughout the case, critical for assistance with exposure, retraction, instrumentation, and closure)  ANESTHESIA: spinal  ESTIMATED BLOOD LOSS: 50 mL  FLUIDS REPLACED: 2200 mL of crystalloid  TOURNIQUET TIME: 123 minutes  DRAINS: 2 medium Hemovac drains  SOFT TISSUE RELEASES: Anterior cruciate ligament, posterior cruciate ligament, deep medial collateral ligament, patellofemoral ligament  IMPLANTS UTILIZED: DePuy Attune size 5N posterior stabilized femoral component (cemented), size 4 rotating platform tibial component (cemented), 5 mm medialized dome patella (cemented), and a 35 mm stabilized rotating platform polyethylene insert.   Discharge Exam: Blood pressure (!) 116/59, pulse 76, temperature 98.2 F (36.8 C), temperature source Oral, resp.  rate 19, height 5\' 3"  (1.6 m), weight 104.3 kg (230 lb), SpO2 91 %.   Disposition: 01-Home or Self Care   Allergies as of 07/08/2016      Reactions   Flagyl [metronidazole] Diarrhea, Nausea Only, Nausea And Vomiting   Pregabalin Other (See Comments)   "dizziness"      Medication List    STOP taking these medications   diclofenac 75 MG EC tablet Commonly known as:  VOLTAREN   oxyCODONE-acetaminophen 5-325 MG tablet Commonly known as:  PERCOCET/ROXICET     TAKE these medications   BIOTIN 5000 5 MG Caps Generic drug:  Biotin Take 5 mg by mouth daily.   celecoxib 200 MG capsule Commonly known as:  CELEBREX Take 1 capsule (200 mg total) by mouth every 12 (twelve) hours.   cetirizine 10 MG tablet Commonly known as:  ZYRTEC Take 10 mg by mouth daily.   cyclobenzaprine 10 MG tablet Commonly known as:  FLEXERIL Take 10 mg by mouth 3 (three) times daily.   enoxaparin 40 MG/0.4ML injection Commonly known as:  LOVENOX Inject 0.4 mLs (40 mg total) into the skin every 12 (twelve) hours.   estradiol 0.5 MG tablet Commonly known as:  ESTRACE Take 0.5 mg by mouth daily.   fluticasone 50 MCG/ACT nasal spray Commonly known as:  FLONASE Place 2 sprays into both nostrils 2 (two) times daily as needed for allergies or rhinitis.   ICAPS AREDS 2 Caps Take 1 tablet by mouth daily.   multivitamin with minerals Tabs tablet Take 1 tablet by mouth daily.   omeprazole 20 MG capsule Commonly known as:  PRILOSEC Take 20 mg by mouth daily.   oxyCODONE 5 MG immediate release tablet Commonly known as:  Oxy IR/ROXICODONE Take 1-2 tablets (5-10  mg total) by mouth every 4 (four) hours as needed for severe pain or breakthrough pain.   SYNTHROID 200 MCG tablet Generic drug:  levothyroxine TAKE 1 TABLET(200 MCG) BY MOUTH EVERY DAY 30 TO 60 MINUTES BEFORE BREAKFAST ON AN EMPTY STOMACH AND WITH A GLASS OF WATER   traMADol 50 MG tablet Commonly known as:  ULTRAM Take 1 tablet (50 mg total)  by mouth every 8 (eight) hours as needed.   traZODone 50 MG tablet Commonly known as:  DESYREL Take 50 mg by mouth at bedtime.   triamterene-hydrochlorothiazide 37.5-25 MG tablet Commonly known as:  MAXZIDE-25 Take 1 tablet by mouth daily.   zolpidem 5 MG tablet Commonly known as:  AMBIEN Take 5 mg by mouth at bedtime.            Durable Medical Equipment        Start     Ordered   07/06/16 1735  DME Walker rolling  Once    Question:  Patient needs a walker to treat with the following condition  Answer:  Total knee replacement status   07/06/16 1734   07/06/16 1735  DME Bedside commode  Once    Question:  Patient needs a bedside commode to treat with the following condition  Answer:  Total knee replacement status   07/06/16 1734     Follow-up Information    Watt Climes, PA Follow up on 07/21/2016.   Specialty:  Physician Assistant Why:  at 10:45am Contact information: [place] [address] [phone]        Dereck Leep, MD Follow up on 08/18/2016.   Specialty:  Orthopedic Surgery Why:  at 11:15am Contact information: [place] [address] [phone]           Signed: Prescott Parma, TODD 07/08/2016, 6:25 AM   Objective: Vital signs in last 24 hours: Temp:  [98.2 F (36.8 C)-98.5 F (36.9 C)] 98.2 F (36.8 C) (05/24 2130) Pulse Rate:  [63-76] 76 (05/24 2130) Resp:  [18-19] 19 (05/24 2130) BP: (116-144)/(59-62) 116/59 (05/24 2130) SpO2:  [91 %-96 %] 91 % (05/24 2130)  Intake/Output from previous day:  Intake/Output Summary (Last 24 hours) at 07/08/16 0625 Last data filed at 07/07/16 2134  Gross per 24 hour  Intake          1028.33 ml  Output              630 ml  Net           398.33 ml    Intake/Output this shift: Total I/O In: -  Out: 80 [Drains:80]  Labs:  Recent Labs  07/07/16 0424 07/08/16 0344  HGB 12.7 12.2    Recent Labs   07/07/16 0424 07/08/16 0344  WBC 9.9 10.8  RBC 4.97 4.84  HCT 38.7 37.6  PLT 282 285    Recent Labs  07/07/16 0424 07/08/16 0344  NA 137 135  K 3.6 3.5  CL 100* 98*  CO2 30 31  BUN 8 10  CREATININE 0.65 0.64  GLUCOSE 111* 102*  CALCIUM 8.5* 8.5*   No results for input(s): LABPT, INR in the last 72 hours.  EXAM: General - Patient is Alert and Oriented Extremity - Neurovascular intact Dorsiflexion/Plantar flexion intact No cellulitis present Compartment soft Incision - clean, dry, no drainage, with the Hemovac removed. Motor Function -  flexion and dorsiflexion is intact. She ambulated 75 feet and stairs.  Assessment/Plan: 2 Days Post-Op Procedure(s) (LRB): COMPUTER ASSISTED TOTAL KNEE ARTHROPLASTY (Right) Procedure(s) (LRB): COMPUTER ASSISTED TOTAL KNEE ARTHROPLASTY (Right) Past Medical History:  Diagnosis Date  . Arthritis   . Chicken pox   . Fibromyalgia   . GERD (gastroesophageal reflux disease)   . Hemorrhoid   . Hypersomnia   . Hypertension   . Hypothyroidism   . Myositis ossificans   . Osteoarthritis   . Sleep apnea    Patient denies.  . Thyroid disease    Active Problems:   S/P total knee arthroplasty  Estimated body mass index is 40.74 kg/m as calculated from the following:   Height as of this encounter: 5\' 3"  (1.6 m).   Weight as of this encounter: 104.3 kg (230 lb). Discharge home with home health today Diet - Regular diet Follow up - in 2 weeks Activity - WBAT Disposition - Home Condition Upon Discharge - Stable DVT Prophylaxis - Lovenox and TED hose  Reche Dixon, PA-C Orthopaedic Surgery 07/08/2016, 6:25 AM

## 2016-07-08 NOTE — Progress Notes (Signed)
Patient is alert and oriented and able to verbalize needs. No complaints of pain at this time. Vital signs stable. PIV removed. Discharge instructions gone over with patient at this time. Printed AVS and hard scripts for oxycodone and tramadol given to patient. Patient verbalizes understanding of all discharge instructions and follow up care. No concerns voiced at this time. Patient called family member for transportation home.   Bethann Punches, RN

## 2016-07-08 NOTE — Progress Notes (Signed)
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  She had L knee replaced in 2006, had issues with scar tissue that was surgically abated in '08.     PT Comments    Pt continues to do well with PT and has made consistent gains with ambulation, strength and ROM.  Pt continues to have expected post-op pain but this has not limited her ability to participate.  She was able to ambulate ~175 ft, negotiate steps and showed good strength with exercises and mobility.  Overall pt is doing well and should be able to safely go home with HHPT today.  Follow Up Recommendations  Home health PT     Equipment Recommendations       Recommendations for Other Services       Precautions / Restrictions Precautions Precautions: Fall Restrictions Weight Bearing Restrictions: Yes RLE Weight Bearing: Weight bearing as tolerated    Mobility  Bed Mobility               General bed mobility comments: pt up on arrival, per CNA she needed no real assist to get up from supine this AM  Transfers Overall transfer level: Independent Equipment used: Rolling walker (2 wheeled)             General transfer comment: Pt needed light cuing for positioning and UE use, but ultimately did well and did not need assist  Ambulation/Gait Ambulation/Gait assistance: Supervision Ambulation Distance (Feet): 175 Feet Assistive device: Rolling walker (2 wheeled)       General Gait Details: Pt able to maintain constant walker motion with increased R LE tolerance.  She did need regular reminders to fully extend knee during heel strike, she also had some fatigue with prlonged ambulation (HR to the 120s, O2 remains in the 90s).  Pt with no LOBs, did need a brief standing rest break.   Stairs Stairs: Yes   Stair Management: One rail Left Number of Stairs: 4 General stair  comments: Pt was able to negotiate up/down steps with b/l UEs on L rail and using sideways strategy. Pt showed improved confidence today, no safety issues.   Wheelchair Mobility    Modified Rankin (Stroke Patients Only)       Balance Overall balance assessment: Modified Independent                                          Cognition Arousal/Alertness: Awake/alert Behavior During Therapy: WFL for tasks assessed/performed Overall Cognitive Status: Within Functional Limits for tasks assessed                                        Exercises Total Joint Exercises Ankle Circles/Pumps: AROM;15 reps Quad Sets: Strengthening;15 reps Gluteal Sets: Strengthening;15 reps Short Arc Quad: Strengthening;15 reps Heel Slides: AROM;Strengthening;10 reps Hip ABduction/ADduction: Strengthening;15 reps Straight Leg Raises: AROM;10 reps Long Arc Quad: Strengthening;10 reps Knee Flexion: PROM;10 reps Goniometric ROM: 0-96    General Comments        Pertinent Vitals/Pain Pain Assessment: 0-10 Pain Score: 5  Pain Location: R knee    Home Living  Prior Function            PT Goals (current goals can now be found in the care plan section) Progress towards PT goals: Progressing toward goals    Frequency    BID      PT Plan Current plan remains appropriate    Co-evaluation              AM-PAC PT "6 Clicks" Daily Activity  Outcome Measure  Difficulty turning over in bed (including adjusting bedclothes, sheets and blankets)?: None Difficulty moving from lying on back to sitting on the side of the bed? : None Difficulty sitting down on and standing up from a chair with arms (e.g., wheelchair, bedside commode, etc,.)?: None Help needed moving to and from a bed to chair (including a wheelchair)?: None Help needed walking in hospital room?: None Help needed climbing 3-5 steps with a railing? : A Little 6 Click  Score: 23    End of Session Equipment Utilized During Treatment: Gait belt Activity Tolerance: Patient tolerated treatment well Patient left: with chair alarm set;with call bell/phone within reach   PT Visit Diagnosis: Muscle weakness (generalized) (M62.81);Difficulty in walking, not elsewhere classified (R26.2)     Time: 5929-2446 PT Time Calculation (min) (ACUTE ONLY): 30 min  Charges:  $Gait Training: 8-22 mins $Therapeutic Exercise: 8-22 mins                    G Codes:       Kreg Shropshire, DPT 07/08/2016, 9:37 AM

## 2016-12-20 ENCOUNTER — Other Ambulatory Visit: Payer: Self-pay | Admitting: Internal Medicine

## 2016-12-20 DIAGNOSIS — Z1231 Encounter for screening mammogram for malignant neoplasm of breast: Secondary | ICD-10-CM

## 2016-12-28 ENCOUNTER — Ambulatory Visit
Admission: RE | Admit: 2016-12-28 | Discharge: 2016-12-28 | Disposition: A | Discharge status: Home / Self Care | Payer: Medicare Other | Source: Ambulatory Visit | Attending: Internal Medicine | Admitting: Internal Medicine

## 2016-12-28 DIAGNOSIS — Z1231 Encounter for screening mammogram for malignant neoplasm of breast: Secondary | ICD-10-CM | POA: Insufficient documentation

## 2017-03-23 ENCOUNTER — Encounter: Payer: Self-pay | Admitting: *Deleted

## 2017-03-24 ENCOUNTER — Ambulatory Visit: Payer: Medicare Other | Admitting: Anesthesiology

## 2017-03-24 ENCOUNTER — Ambulatory Visit
Admission: RE | Admit: 2017-03-24 | Discharge: 2017-03-24 | Disposition: A | Discharge status: Home / Self Care | Payer: Medicare Other | Source: Ambulatory Visit | Attending: Gastroenterology | Admitting: Gastroenterology

## 2017-03-24 ENCOUNTER — Other Ambulatory Visit: Payer: Self-pay

## 2017-03-24 ENCOUNTER — Encounter
Admission: RE | Disposition: A | Discharge status: Home / Self Care | Payer: Self-pay | Source: Ambulatory Visit | Attending: Gastroenterology

## 2017-03-24 ENCOUNTER — Encounter: Payer: Self-pay | Admitting: *Deleted

## 2017-03-24 DIAGNOSIS — E669 Obesity, unspecified: Secondary | ICD-10-CM | POA: Insufficient documentation

## 2017-03-24 DIAGNOSIS — Z79899 Other long term (current) drug therapy: Secondary | ICD-10-CM | POA: Diagnosis not present

## 2017-03-24 DIAGNOSIS — D125 Benign neoplasm of sigmoid colon: Secondary | ICD-10-CM | POA: Insufficient documentation

## 2017-03-24 DIAGNOSIS — K317 Polyp of stomach and duodenum: Secondary | ICD-10-CM | POA: Diagnosis not present

## 2017-03-24 DIAGNOSIS — Z888 Allergy status to other drugs, medicaments and biological substances status: Secondary | ICD-10-CM | POA: Insufficient documentation

## 2017-03-24 DIAGNOSIS — I10 Essential (primary) hypertension: Secondary | ICD-10-CM | POA: Insufficient documentation

## 2017-03-24 DIAGNOSIS — M797 Fibromyalgia: Secondary | ICD-10-CM | POA: Diagnosis not present

## 2017-03-24 DIAGNOSIS — K5730 Diverticulosis of large intestine without perforation or abscess without bleeding: Secondary | ICD-10-CM | POA: Insufficient documentation

## 2017-03-24 DIAGNOSIS — K210 Gastro-esophageal reflux disease with esophagitis: Secondary | ICD-10-CM | POA: Diagnosis not present

## 2017-03-24 DIAGNOSIS — D124 Benign neoplasm of descending colon: Secondary | ICD-10-CM | POA: Diagnosis not present

## 2017-03-24 DIAGNOSIS — Z87891 Personal history of nicotine dependence: Secondary | ICD-10-CM | POA: Insufficient documentation

## 2017-03-24 DIAGNOSIS — Z1211 Encounter for screening for malignant neoplasm of colon: Secondary | ICD-10-CM | POA: Insufficient documentation

## 2017-03-24 DIAGNOSIS — G4710 Hypersomnia, unspecified: Secondary | ICD-10-CM | POA: Insufficient documentation

## 2017-03-24 DIAGNOSIS — Z6841 Body Mass Index (BMI) 40.0 and over, adult: Secondary | ICD-10-CM | POA: Diagnosis not present

## 2017-03-24 DIAGNOSIS — D120 Benign neoplasm of cecum: Secondary | ICD-10-CM | POA: Diagnosis not present

## 2017-03-24 DIAGNOSIS — Z881 Allergy status to other antibiotic agents status: Secondary | ICD-10-CM | POA: Diagnosis not present

## 2017-03-24 DIAGNOSIS — M1990 Unspecified osteoarthritis, unspecified site: Secondary | ICD-10-CM | POA: Insufficient documentation

## 2017-03-24 DIAGNOSIS — E039 Hypothyroidism, unspecified: Secondary | ICD-10-CM | POA: Insufficient documentation

## 2017-03-24 DIAGNOSIS — Z8371 Family history of colonic polyps: Secondary | ICD-10-CM | POA: Insufficient documentation

## 2017-03-24 DIAGNOSIS — K2970 Gastritis, unspecified, without bleeding: Secondary | ICD-10-CM | POA: Diagnosis not present

## 2017-03-24 HISTORY — PX: COLONOSCOPY WITH PROPOFOL: SHX5780

## 2017-03-24 HISTORY — PX: ESOPHAGOGASTRODUODENOSCOPY (EGD) WITH PROPOFOL: SHX5813

## 2017-03-24 SURGERY — ESOPHAGOGASTRODUODENOSCOPY (EGD) WITH PROPOFOL
Anesthesia: General

## 2017-03-24 MED ORDER — PROPOFOL 500 MG/50ML IV EMUL
INTRAVENOUS | Status: AC
  Filled 2017-03-24: qty 50

## 2017-03-24 MED ORDER — FENTANYL CITRATE (PF) 100 MCG/2ML IJ SOLN
INTRAMUSCULAR | Status: AC
  Filled 2017-03-24: qty 2

## 2017-03-24 MED ORDER — PROPOFOL 500 MG/50ML IV EMUL
INTRAVENOUS | Status: DC | PRN
  Administered 2017-03-24: 80 ug/kg/min via INTRAVENOUS

## 2017-03-24 MED ORDER — MIDAZOLAM HCL 2 MG/2ML IJ SOLN
INTRAMUSCULAR | Status: AC
  Filled 2017-03-24: qty 2

## 2017-03-24 MED ORDER — PROPOFOL 10 MG/ML IV BOLUS
INTRAVENOUS | Status: AC
  Filled 2017-03-24: qty 20

## 2017-03-24 MED ORDER — FENTANYL CITRATE (PF) 100 MCG/2ML IJ SOLN
INTRAMUSCULAR | Status: DC | PRN
  Administered 2017-03-24 (×2): 50 ug via INTRAVENOUS

## 2017-03-24 MED ORDER — AMPICILLIN SODIUM 2 G IJ SOLR
INTRAMUSCULAR | Status: AC
  Administered 2017-03-24: 2 g via INTRAVENOUS
  Filled 2017-03-24: qty 2000

## 2017-03-24 MED ORDER — MIDAZOLAM HCL 2 MG/2ML IJ SOLN
INTRAMUSCULAR | Status: DC | PRN
  Administered 2017-03-24 (×2): 1 mg via INTRAVENOUS

## 2017-03-24 MED ORDER — SODIUM CHLORIDE 0.9 % IV SOLN: INTRAVENOUS | Status: DC

## 2017-03-24 MED ORDER — GLYCOPYRROLATE 0.2 MG/ML IJ SOLN
INTRAMUSCULAR | Status: DC | PRN
  Administered 2017-03-24: 0.2 mg via INTRAVENOUS

## 2017-03-24 MED ORDER — PROPOFOL 10 MG/ML IV BOLUS
INTRAVENOUS | Status: DC | PRN
  Administered 2017-03-24 (×4): 50 mg via INTRAVENOUS

## 2017-03-24 MED ORDER — SODIUM CHLORIDE 0.9 % IV SOLN
2.0000 g | Freq: Four times a day (QID) | INTRAVENOUS | Status: DC
  Administered 2017-03-24: 2 g via INTRAVENOUS

## 2017-03-24 MED ORDER — SODIUM CHLORIDE 0.9 % IV SOLN
INTRAVENOUS | Status: DC
  Administered 2017-03-24: 09:00:00 via INTRAVENOUS

## 2017-03-24 NOTE — Anesthesia Post-op Follow-up Note (Signed)
Anesthesia QCDR form completed.        

## 2017-03-24 NOTE — H&P (Signed)
Outpatient short stay form Pre-procedure 03/24/2017 8:48 AM Lollie Sails MD  Primary Physician: Dr Tracie Harrier  Reason for visit: EGD and colonoscopy  History of present illness: Patient is a 67 year old female with a personal history of Barrett's esophagus and GERD.  She has had increased symptoms of GERD and feels that the her PPIs are not effective.  She is taking 20 mg of omeprazole twice a day however on further discussion with her she is taking these after meals and before bedtime and I believe the effectivity is fairly low relatively.  We discussed this at length today.  She also has family history of colon polyps in her mother.  She did have a colonoscopy about 5 years ago.  And is due for routine screening.  She tolerated her prep well.  She takes no aspirin or blood thinning agent.    Current Facility-Administered Medications:  .  0.9 %  sodium chloride infusion, , Intravenous, Continuous, Lollie Sails, MD, Last Rate: 20 mL/hr at 03/24/17 0847 .  0.9 %  sodium chloride infusion, , Intravenous, Continuous, Lollie Sails, MD .  ampicillin (OMNIPEN) 2 g in sodium chloride 0.9 % 50 mL IVPB, 2 g, Intravenous, Q6H, Lollie Sails, MD, Last Rate: 150 mL/hr at 03/24/17 0846, 2 g at 03/24/17 0846  Medications Prior to Admission  Medication Sig Dispense Refill Last Dose  . Biotin (BIOTIN 5000) 5 MG CAPS Take 5 mg by mouth daily.   Past Week at Unknown time  . celecoxib (CELEBREX) 200 MG capsule Take 1 capsule (200 mg total) by mouth every 12 (twelve) hours. 60 capsule 1 Past Week at Unknown time  . cetirizine (ZYRTEC) 10 MG tablet Take 10 mg by mouth daily.   Past Week at Unknown time  . cyclobenzaprine (FLEXERIL) 10 MG tablet Take 10 mg by mouth 3 (three) times daily.    Past Week at Unknown time  . estradiol (ESTRACE) 0.5 MG tablet Take 0.5 mg by mouth daily.   03/23/2017 at 1900  . fluticasone (FLONASE) 50 MCG/ACT nasal spray Place 2 sprays into both nostrils 2 (two)  times daily as needed for allergies or rhinitis.   Past Week at Unknown time  . levothyroxine (SYNTHROID) 200 MCG tablet TAKE 1 TABLET(200 MCG) BY MOUTH EVERY DAY 30 TO 60 MINUTES BEFORE BREAKFAST ON AN EMPTY STOMACH AND WITH A GLASS OF WATER   03/23/2017 at 1900  . Multiple Vitamin (MULTIVITAMIN WITH MINERALS) TABS tablet Take 1 tablet by mouth daily.   Past Week at Unknown time  . Multiple Vitamins-Minerals (ICAPS AREDS 2) CAPS Take 1 tablet by mouth daily.   Past Week at Unknown time  . omeprazole (PRILOSEC) 20 MG capsule Take 20 mg by mouth daily.    Past Week at Unknown time  . traZODone (DESYREL) 50 MG tablet Take 50 mg by mouth at bedtime.   Past Week at Unknown time  . zolpidem (AMBIEN) 5 MG tablet Take 5 mg by mouth at bedtime.    Past Week at Unknown time  . azithromycin (ZITHROMAX) 250 MG tablet Take 250 mg by mouth daily.   Completed Course at Unknown time  . enoxaparin (LOVENOX) 40 MG/0.4ML injection Inject 0.4 mLs (40 mg total) into the skin every 12 (twelve) hours. (Patient not taking: Reported on 03/24/2017) 14 Syringe 0 Completed Course at Unknown time  . oxyCODONE (OXY IR/ROXICODONE) 5 MG immediate release tablet Take 1-2 tablets (5-10 mg total) by mouth every 4 (four) hours as needed  for severe pain or breakthrough pain. (Patient not taking: Reported on 03/24/2017) 40 tablet 0 Completed Course at Unknown time  . traMADol (ULTRAM) 50 MG tablet Take 1 tablet (50 mg total) by mouth every 8 (eight) hours as needed. (Patient not taking: Reported on 03/24/2017) 40 tablet 1 Completed Course at Unknown time  . triamterene-hydrochlorothiazide (MAXZIDE-25) 37.5-25 MG tablet Take 1 tablet by mouth daily.   Not Taking at Unknown time     Allergies  Allergen Reactions  . Flagyl [Metronidazole] Diarrhea, Nausea Only and Nausea And Vomiting  . Pregabalin Other (See Comments)    "dizziness"     Past Medical History:  Diagnosis Date  . Arthritis   . Chicken pox   . Fibromyalgia   . GERD  (gastroesophageal reflux disease)   . Hemorrhoid   . Hypersomnia   . Hypertension   . Hypothyroidism   . Myositis ossificans   . Osteoarthritis   . Thyroid disease     Review of systems:      Physical Exam    Heart and lungs: Clear.    HEENT: Normocephalic atraumatic eyes are anicteric    Other:    Pertinant exam for procedure: Soft nontender nondistended bowel sounds positive normoactive.    Planned proceedures: EGD, colonoscopy and indicated procedures. I have discussed the risks benefits and complications of procedures to include not limited to bleeding, infection, perforation and the risk of sedation and the patient wishes to proceed.    Lollie Sails, MD Gastroenterology 03/24/2017  8:48 AM

## 2017-03-24 NOTE — Transfer of Care (Signed)
Immediate Anesthesia Transfer of Care Note  Patient: Glenda Gomez  Procedure(s) Performed: ESOPHAGOGASTRODUODENOSCOPY (EGD) WITH PROPOFOL (N/A ) COLONOSCOPY WITH PROPOFOL (N/A )  Patient Location: PACU  Anesthesia Type:General  Level of Consciousness: awake and alert   Airway & Oxygen Therapy: Patient Spontanous Breathing and Patient connected to nasal cannula oxygen  Post-op Assessment: Report given to RN and Post -op Vital signs reviewed and stable  Post vital signs: Reviewed and stable  Last Vitals:  Vitals:   03/24/17 0801  BP: (!) 147/70  Pulse: (!) 55  Resp: 15  Temp: (!) 35.3 C  SpO2: 100%    Last Pain:  Vitals:   03/24/17 0801  TempSrc: Tympanic      Patients Stated Pain Goal: 8 (78/58/85 0277)  Complications: No apparent anesthesia complications

## 2017-03-24 NOTE — Op Note (Signed)
[facility] Gastroenterology Patient Name: Glenda Gomez Procedure Date: 03/24/2017 8:38 AM MRN: [id number] Account #: [id number] Date of Birth: [birth year] Admit Type: Outpatient Age: 67 Room: [facility] ENDO ROOM 1 Gender: Female Note Status: Finalized Procedure:            Colonoscopy Indications:          Family history of colonic polyps in a first-degree                        relative Providers:            Lollie Sails, MD Medicines:            Monitored Anesthesia Care Complications:        No immediate complications. Procedure:            Pre-Anesthesia Assessment:                       - ASA Grade Assessment: II - A patient with mild                        systemic disease.                       After obtaining informed consent, the colonoscope was                        passed under direct vision. Throughout the procedure,                        the patient's blood pressure, pulse, and oxygen                        saturations were monitored continuously. The Olympus                        PCF-H180AL colonoscope ( S#: [id number] ) was introduced                        through the anus and advanced to the the cecum,                        identified by appendiceal orifice and ileocecal valve.                        The colonoscopy was performed with moderate difficulty                        due to poor bowel prep. Successful completion of the                        procedure was aided by changing the patient to a supine                        position, changing the patient to a prone position and                        lavage. The patient tolerated the procedure well. The                        quality of  the bowel preparation was good except the                        sigmoid colon was fair. Findings:      Many medium-mouthed diverticula were found in the sigmoid colon and       descending colon.      A 2 mm polyp was found in the cecum. The polyp was  sessile. The polyp       was removed with a cold biopsy forceps. Resection and retrieval were       complete.      A 4 mm polyp was found in the cecum. The polyp was sessile. The polyp       was removed with a cold snare. Resection and retrieval were complete.      Two sessile polyps were found in the descending colon. The polyps were 1       to 2 mm in size. These polyps were removed with a cold biopsy forceps.       Resection and retrieval were complete.      A 1 mm polyp was found in the distal sigmoid colon. The polyp was       sessile. The polyp was removed with a cold biopsy forceps. Resection and       retrieval were complete.      No additional abnormalities were found on retroflexion.      The digital rectal exam was normal. Impression:           - Diverticulosis in the sigmoid colon and in the                        descending colon.                       - One 2 mm polyp in the cecum, removed with a cold                        biopsy forceps. Resected and retrieved.                       - One 4 mm polyp in the cecum, removed with a cold                        snare. Resected and retrieved.                       - Two 1 to 2 mm polyps in the descending colon, removed                        with a cold biopsy forceps. Resected and retrieved.                       - One 1 mm polyp in the distal sigmoid colon, removed                        with a cold biopsy forceps. Resected and retrieved. Recommendation:       - Discharge patient to home.                       - Soft diet for 2 days, then advance as tolerated to  advance diet as tolerated. Procedure Code(s):    --- Professional ---                       [phone], Colonoscopy, flexible; with removal of tumor(s),                        polyp(s), or other lesion(s) by snare technique                       45380, 55, Colonoscopy, flexible; with biopsy, single                        or multiple Diagnosis  Code(s):    --- Professional ---                       D12.0, Benign neoplasm of cecum                       D12.5, Benign neoplasm of sigmoid colon                       D12.4, Benign neoplasm of descending colon                       Z83.71, Family history of colonic polyps                       K57.30, Diverticulosis of large intestine without                        perforation or abscess without bleeding CPT copyright 2016 American Medical Association. All rights reserved. The codes documented in this report are preliminary and upon coder review may  be revised to meet current compliance requirements. Lollie Sails, MD 03/24/2017 10:18:33 AM This report has been signed electronically. Number of Addenda: 0 Note Initiated On: 03/24/2017 8:38 AM Scope Withdrawal Time: 0 hours 17 minutes 26 seconds  Total Procedure Duration: 0 hours 24 minutes 4 seconds       [facility]

## 2017-03-24 NOTE — Anesthesia Postprocedure Evaluation (Signed)
Anesthesia Post Note  Patient: Sawyer C Weinheimer  Procedure(s) Performed: ESOPHAGOGASTRODUODENOSCOPY (EGD) WITH PROPOFOL (N/A ) COLONOSCOPY WITH PROPOFOL (N/A )  Patient location during evaluation: Endoscopy Anesthesia Type: General Level of consciousness: awake and alert and oriented Pain management: pain level controlled Vital Signs Assessment: post-procedure vital signs reviewed and stable Respiratory status: spontaneous breathing, nonlabored ventilation and respiratory function stable Cardiovascular status: blood pressure returned to baseline and stable Postop Assessment: no signs of nausea or vomiting Anesthetic complications: no     Last Vitals:  Vitals:   03/24/17 1031 03/24/17 1041  BP: 128/75 139/75  Pulse: 63 (!) 59  Resp: 19 10  Temp:    SpO2: 98% 100%    Last Pain:  Vitals:   03/24/17 0801  TempSrc: Tympanic                 [facility]

## 2017-03-24 NOTE — Op Note (Signed)
[facility] Gastroenterology Patient Name: Glenda Gomez Procedure Date: 03/24/2017 8:38 AM MRN: [id number] Account #: [id number] Date of Birth: 01-Apr-1950 Admit Type: Outpatient Age: 67 Room: [facility] ENDO ROOM 1 Gender: Female Note Status: Finalized Procedure:            Upper GI endoscopy Indications:          Follow-up of Barrett's esophagus Providers:            Lollie Sails, MD Referring MD:         Tracie Harrier, MD (Referring MD) Medicines:            Monitored Anesthesia Care Complications:        No immediate complications. Procedure:            Pre-Anesthesia Assessment:                       - ASA Grade Assessment: II - A patient with mild                        systemic disease.                       After obtaining informed consent, the endoscope was                        passed under direct vision. Throughout the procedure,                        the patient's blood pressure, pulse, and oxygen                        saturations were monitored continuously. The Endoscope                        was introduced through the mouth, and advanced to the                        third part of duodenum. The upper GI endoscopy was                        accomplished without difficulty. The patient tolerated                        the procedure well. Findings:      LA Grade B (one or more mucosal breaks greater than 5 mm, not extending       between the tops of two mucosal folds) esophagitis with no bleeding was       found 35 cm from the incisors. The scope is advanced beyond the apparent       diaphragmatic hiatus, located at about 36 cm from the incisors. The       scope was advanced through this area to a tubular stomach extending from       the 36 cm to about 42 cm where there is a narrow area, consistant with       GE junction in appearance. Mucosal biopsies were taken from 42, 40, 38       cm to the apparent GE junction at 36 cm. The stomach appears  normal with       some mild gastritis below the narrowed area ( the narrow  area normal in       appearance) . Biopsies were taken from the gastric vault. On retroflex       the also is atypical in appearance, long and narrow, otherwise normal.       The scope was then taken into a normal appearing distal stomach and       duodenum. Pictures taken. Retroflex evaluation as noted.      Small semi pedunculated polyp below GE junction biopsied. Impression:           - LA Grade B reflux esophagitis. Rule out Barrett's                        esophagus.                       - Atypical post-operative anatomy. Recommendation:       - Await pathology results.                       - Use Protonix (pantoprazole) 40 mg PO daily daily. Procedure Code(s):    --- Professional ---                       [phone], Esophagogastroduodenoscopy, flexible, transoral;                        diagnostic, including collection of specimen(s) by                        brushing or washing, when performed (separate procedure) Diagnosis Code(s):    --- Professional ---                       K21.0, Gastro-esophageal reflux disease with esophagitis                       K22.70, Barrett's esophagus without dysplasia CPT copyright 2016 American Medical Association. All rights reserved. The codes documented in this report are preliminary and upon coder review may  be revised to meet current compliance requirements. Lollie Sails, MD 03/24/2017 9:48:40 AM This report has been signed electronically. Number of Addenda: 0 Note Initiated On: 03/24/2017 8:38 AM      [facility]

## 2017-03-24 NOTE — Anesthesia Preprocedure Evaluation (Signed)
Anesthesia Evaluation  Patient identified by MRN, date of birth, ID band Patient awake    Reviewed: Allergy & Precautions, NPO status , Patient's Chart, lab work & pertinent test results  History of Anesthesia Complications Negative for: history of anesthetic complications  Airway Mallampati: II  TM Distance: >3 FB Neck ROM: Full    Dental  (+) Implants   Pulmonary neg sleep apnea, neg COPD, former smoker,    breath sounds clear to auscultation- rhonchi (-) wheezing      Cardiovascular Exercise Tolerance: Good hypertension, Pt. on medications (-) CAD, (-) Past MI, (-) Cardiac Stents and (-) CABG  Rhythm:Regular Rate:Normal - Systolic murmurs and - Diastolic murmurs    Neuro/Psych negative neurological ROS  negative psych ROS   GI/Hepatic Neg liver ROS, GERD  ,  Endo/Other  neg diabetesHypothyroidism   Renal/GU negative Renal ROS     Musculoskeletal  (+) Arthritis , Fibromyalgia -  Abdominal (+) + obese,   Peds  Hematology negative hematology ROS (+)   Anesthesia Other Findings Past Medical History: No date: Arthritis No date: Chicken pox No date: Fibromyalgia No date: GERD (gastroesophageal reflux disease) No date: Hemorrhoid No date: Hypersomnia No date: Hypertension No date: Hypothyroidism 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 No date: Osteoarthritis No date: Thyroid disease   Reproductive/Obstetrics                             Anesthesia Physical Anesthesia Plan  ASA: II  Anesthesia Plan: General   Post-op Pain Management:    Induction: Intravenous  PONV Risk Score and Plan: 2 and Propofol infusion  Airway Management Planned: Natural Airway  Additional Equipment:   Intra-op Plan:   Post-operative Plan:   Informed Consent: I have reviewed the patients History and Physical, chart, labs and discussed the procedure including the risks, benefits and alternatives for the  proposed anesthesia with the patient or authorized representative who has indicated his/her understanding and acceptance.   Dental advisory given  Plan Discussed with: CRNA and Anesthesiologist  Anesthesia Plan Comments:         Anesthesia Quick Evaluation

## 2017-03-25 ENCOUNTER — Encounter: Payer: Self-pay | Admitting: Gastroenterology

## 2017-03-27 LAB — SURGICAL PATHOLOGY

## 2017-08-23 DIAGNOSIS — R079 Chest pain, unspecified: Secondary | ICD-10-CM | POA: Insufficient documentation

## 2017-08-23 DIAGNOSIS — R0609 Other forms of dyspnea: Secondary | ICD-10-CM | POA: Insufficient documentation

## 2017-09-12 DIAGNOSIS — R5382 Chronic fatigue, unspecified: Secondary | ICD-10-CM | POA: Insufficient documentation

## 2017-12-04 ENCOUNTER — Other Ambulatory Visit: Payer: Self-pay | Admitting: Internal Medicine

## 2017-12-04 DIAGNOSIS — Z1231 Encounter for screening mammogram for malignant neoplasm of breast: Secondary | ICD-10-CM

## 2018-01-01 ENCOUNTER — Ambulatory Visit
Admission: RE | Admit: 2018-01-01 | Discharge: 2018-01-01 | Disposition: A | Discharge status: Home / Self Care | Payer: Medicare HMO | Source: Ambulatory Visit | Attending: Internal Medicine | Admitting: Internal Medicine

## 2018-01-01 ENCOUNTER — Encounter (INDEPENDENT_AMBULATORY_CARE_PROVIDER_SITE_OTHER): Payer: Self-pay

## 2018-01-01 DIAGNOSIS — Z1231 Encounter for screening mammogram for malignant neoplasm of breast: Secondary | ICD-10-CM | POA: Diagnosis present

## 2018-01-29 ENCOUNTER — Other Ambulatory Visit: Payer: Self-pay | Admitting: Physician Assistant

## 2018-01-29 ENCOUNTER — Ambulatory Visit
Admission: RE | Admit: 2018-01-29 | Discharge: 2018-01-29 | Disposition: A | Discharge status: Home / Self Care | Payer: Medicare HMO | Source: Ambulatory Visit | Attending: Physician Assistant | Admitting: Physician Assistant

## 2018-01-29 DIAGNOSIS — R053 Chronic cough: Secondary | ICD-10-CM

## 2018-01-29 DIAGNOSIS — R0602 Shortness of breath: Secondary | ICD-10-CM | POA: Diagnosis not present

## 2018-01-29 DIAGNOSIS — R05 Cough: Secondary | ICD-10-CM

## 2018-01-29 LAB — POCT I-STAT CREATININE: CREATININE: 0.7 mg/dL (ref 0.44–1.00)

## 2018-01-29 MED ORDER — IOPAMIDOL (ISOVUE-300) INJECTION 61%
75.0000 mL | Freq: Once | INTRAVENOUS | Status: AC | PRN
  Administered 2018-01-29: 75 mL via INTRAVENOUS

## 2018-01-30 ENCOUNTER — Other Ambulatory Visit: Payer: Self-pay | Admitting: Physician Assistant

## 2018-01-30 DIAGNOSIS — R918 Other nonspecific abnormal finding of lung field: Secondary | ICD-10-CM

## 2018-02-02 ENCOUNTER — Encounter
Admission: RE | Admit: 2018-02-02 | Discharge: 2018-02-02 | Disposition: A | Discharge status: Home / Self Care | Payer: Medicare HMO | Source: Ambulatory Visit | Attending: Physician Assistant | Admitting: Physician Assistant

## 2018-02-02 DIAGNOSIS — R918 Other nonspecific abnormal finding of lung field: Secondary | ICD-10-CM | POA: Insufficient documentation

## 2018-02-02 LAB — GLUCOSE, CAPILLARY: GLUCOSE-CAPILLARY: 93 mg/dL (ref 70–99)

## 2018-02-02 MED ORDER — FLUDEOXYGLUCOSE F - 18 (FDG) INJECTION
11.7400 | Freq: Once | INTRAVENOUS | Status: AC | PRN
  Administered 2018-02-02: 11.74 via INTRAVENOUS

## 2018-03-20 ENCOUNTER — Other Ambulatory Visit: Payer: Self-pay | Admitting: Internal Medicine

## 2018-03-20 ENCOUNTER — Other Ambulatory Visit (HOSPITAL_COMMUNITY): Payer: Self-pay | Admitting: Internal Medicine

## 2018-03-20 DIAGNOSIS — R911 Solitary pulmonary nodule: Secondary | ICD-10-CM

## 2018-05-04 ENCOUNTER — Ambulatory Visit: Payer: Medicare HMO

## 2018-07-18 ENCOUNTER — Other Ambulatory Visit: Payer: Self-pay | Admitting: Internal Medicine

## 2018-07-18 ENCOUNTER — Other Ambulatory Visit (HOSPITAL_COMMUNITY): Payer: Self-pay | Admitting: Internal Medicine

## 2018-07-18 DIAGNOSIS — R911 Solitary pulmonary nodule: Secondary | ICD-10-CM

## 2018-07-30 ENCOUNTER — Other Ambulatory Visit (HOSPITAL_COMMUNITY): Payer: Self-pay | Admitting: Student

## 2018-07-30 ENCOUNTER — Other Ambulatory Visit: Payer: Self-pay | Admitting: Student

## 2018-07-30 DIAGNOSIS — M7582 Other shoulder lesions, left shoulder: Secondary | ICD-10-CM

## 2018-07-30 DIAGNOSIS — M7522 Bicipital tendinitis, left shoulder: Secondary | ICD-10-CM

## 2018-08-01 ENCOUNTER — Ambulatory Visit
Admission: RE | Admit: 2018-08-01 | Discharge: 2018-08-01 | Disposition: A | Discharge status: Home / Self Care | Payer: Medicare HMO | Source: Ambulatory Visit | Attending: Internal Medicine | Admitting: Internal Medicine

## 2018-08-01 ENCOUNTER — Other Ambulatory Visit: Payer: Self-pay

## 2018-08-01 ENCOUNTER — Encounter (INDEPENDENT_AMBULATORY_CARE_PROVIDER_SITE_OTHER): Payer: Self-pay

## 2018-08-01 DIAGNOSIS — R911 Solitary pulmonary nodule: Secondary | ICD-10-CM | POA: Insufficient documentation

## 2018-08-01 MED ORDER — IOHEXOL 300 MG/ML  SOLN
75.0000 mL | Freq: Once | INTRAMUSCULAR | Status: AC | PRN
  Administered 2018-08-01: 75 mL via INTRAVENOUS

## 2018-08-13 ENCOUNTER — Other Ambulatory Visit: Payer: Self-pay | Admitting: Pulmonary Disease

## 2018-08-13 ENCOUNTER — Other Ambulatory Visit (HOSPITAL_COMMUNITY): Payer: Self-pay | Admitting: Pulmonary Disease

## 2018-08-13 DIAGNOSIS — J9801 Acute bronchospasm: Secondary | ICD-10-CM

## 2018-08-16 ENCOUNTER — Other Ambulatory Visit: Payer: Self-pay | Admitting: Gastroenterology

## 2018-08-16 DIAGNOSIS — K449 Diaphragmatic hernia without obstruction or gangrene: Secondary | ICD-10-CM

## 2018-08-16 DIAGNOSIS — K2100 Gastro-esophageal reflux disease with esophagitis, without bleeding: Secondary | ICD-10-CM

## 2018-08-20 ENCOUNTER — Other Ambulatory Visit: Payer: Self-pay

## 2018-08-20 ENCOUNTER — Ambulatory Visit
Admission: RE | Admit: 2018-08-20 | Discharge: 2018-08-20 | Disposition: A | Discharge status: Home / Self Care | Payer: Medicare HMO | Source: Ambulatory Visit | Attending: Student | Admitting: Student

## 2018-08-20 DIAGNOSIS — M7522 Bicipital tendinitis, left shoulder: Secondary | ICD-10-CM | POA: Insufficient documentation

## 2018-08-20 DIAGNOSIS — M7582 Other shoulder lesions, left shoulder: Secondary | ICD-10-CM

## 2018-08-22 ENCOUNTER — Other Ambulatory Visit: Payer: Self-pay

## 2018-08-22 ENCOUNTER — Ambulatory Visit
Admission: RE | Admit: 2018-08-22 | Discharge: 2018-08-22 | Disposition: A | Discharge status: Home / Self Care | Payer: Medicare HMO | Source: Ambulatory Visit | Attending: Gastroenterology | Admitting: Gastroenterology

## 2018-08-22 ENCOUNTER — Other Ambulatory Visit: Payer: Self-pay | Admitting: Gastroenterology

## 2018-08-22 DIAGNOSIS — K449 Diaphragmatic hernia without obstruction or gangrene: Secondary | ICD-10-CM

## 2018-08-22 DIAGNOSIS — K2100 Gastro-esophageal reflux disease with esophagitis, without bleeding: Secondary | ICD-10-CM

## 2018-09-03 DIAGNOSIS — M7582 Other shoulder lesions, left shoulder: Secondary | ICD-10-CM | POA: Insufficient documentation

## 2018-09-18 ENCOUNTER — Other Ambulatory Visit: Payer: Self-pay

## 2018-09-18 ENCOUNTER — Encounter
Admission: RE | Admit: 2018-09-18 | Discharge: 2018-09-18 | Disposition: A | Discharge status: Home / Self Care | Payer: Medicare HMO | Source: Ambulatory Visit | Attending: Surgery | Admitting: Surgery

## 2018-09-18 DIAGNOSIS — Z01810 Encounter for preprocedural cardiovascular examination: Secondary | ICD-10-CM | POA: Diagnosis not present

## 2018-09-18 DIAGNOSIS — I491 Atrial premature depolarization: Secondary | ICD-10-CM | POA: Diagnosis not present

## 2018-09-18 DIAGNOSIS — Z01818 Encounter for other preprocedural examination: Secondary | ICD-10-CM | POA: Diagnosis not present

## 2018-09-18 DIAGNOSIS — Z20828 Contact with and (suspected) exposure to other viral communicable diseases: Secondary | ICD-10-CM | POA: Insufficient documentation

## 2018-09-18 HISTORY — DX: Dyspnea, unspecified: R06.00

## 2018-09-18 HISTORY — DX: Other allergic rhinitis: J30.89

## 2018-09-18 NOTE — Patient Instructions (Signed)
Your procedure is scheduled on: 09/25/2018 Tues Report to Same Day Surgery 2nd floor medical mall [facility] Entrance-take elevator on left to 2nd floor.  Check in with surgery information desk.) To find out your arrival time please call [phone] between 1PM - 3PM on 09/25/2018 Mon  Remember: Instructions that are not followed completely may result in serious medical risk, up to and including death, or upon the discretion of your surgeon and anesthesiologist your surgery may need to be rescheduled.    _x___ 1. Do not eat food after midnight the night before your procedure. You may drink clear liquids up to 2 hours before you are scheduled to arrive at the hospital for your procedure.  Do not drink clear liquids within 2 hours of your scheduled arrival to the hospital.  Clear liquids include  --Water or Apple juice without pulp  --Clear carbohydrate beverage such as ClearFast or Gatorade  --Black Coffee or Clear Tea (No milk, no creamers, do not add anything to                  the coffee or Tea Type 1 and type 2 diabetics should only drink water.   ____Ensure clear carbohydrate drink on the way to the hospital for bariatric patients  ____Ensure clear carbohydrate drink 3 hours before surgery.   No gum chewing or hard candies.     __x__ 2. No Alcohol for 24 hours before or after surgery.   __x__3. No Smoking or e-cigarettes for 24 prior to surgery.  Do not use any chewable tobacco products for at least 6 hour prior to surgery   ____  4. Bring all medications with you on the day of surgery if instructed.    __x__ 5. Notify your doctor if there is any change in your medical condition     (cold, fever, infections).    x___6. On the morning of surgery brush your teeth with toothpaste and water.  You may rinse your mouth with mouth wash if you wish.  Do not swallow any toothpaste or mouthwash.   Do not wear jewelry, make-up, hairpins, clips or nail polish.  Do not wear lotions,  powders, or perfumes. You may wear deodorant.  Do not shave 48 hours prior to surgery. Men may shave face and neck.  Do not bring valuables to the hospital.    [facility] is not responsible for any belongings or valuables.               Contacts, dentures or bridgework may not be worn into surgery.  Leave your suitcase in the car. After surgery it may be brought to your room.  For patients admitted to the hospital, discharge time is determined by your                       treatment team.  _  Patients discharged the day of surgery will not be allowed to drive home.  You will need someone to drive you home and stay with you the night of your procedure.    Please read over the following fact sheets that you were given:   [facility] Preparing for Surgery and or MRSA Information   _x___ Take anti-hypertensive listed below, cardiac, seizure, asthma,     anti-reflux and psychiatric medicines. These include:  1. levothyroxine (SYNTHROID) 200 MCG tablet  2.fluticasone (FLONASE) 50 MCG/ACT nasal spray if needed  3.omeprazole (PRILOSEC) 20 MG capsule  4.traMADol (ULTRAM) 50 MG tablet  if needed  5.  6.  ____Fleets enema or Magnesium Citrate as directed.   _x___ Use CHG Soap or sage wipes as directed on instruction sheet   ____ Use inhalers on the day of surgery and bring to hospital day of surgery  ____ Stop Metformin and Janumet 2 days prior to surgery.    ____ Take 1/2 of usual insulin dose the night before surgery and none on the morning     surgery.   _x___ Follow recommendations from Cardiologist, Pulmonologist or PCP regarding          stopping Aspirin, Coumadin, Plavix ,Eliquis, Effient, or Pradaxa, and Pletal.  X____Stop Anti-inflammatories such as Advil, Aleve, Ibuprofen, Motrin, Naproxen, Naprosyn, Goodies powders or aspirin products. OK to take Tylenol and                          Celebrex.   _x___ Stop supplements until after surgery.  But may continue Vitamin D, Vitamin B,        and multivitamin.   ____ Bring C-Pap to the hospital.

## 2018-09-21 ENCOUNTER — Other Ambulatory Visit
Admission: RE | Admit: 2018-09-21 | Discharge: 2018-09-21 | Disposition: A | Discharge status: Home / Self Care | Payer: Medicare HMO | Source: Ambulatory Visit | Attending: Surgery | Admitting: Surgery

## 2018-09-21 ENCOUNTER — Other Ambulatory Visit: Payer: Self-pay

## 2018-09-21 DIAGNOSIS — Z01818 Encounter for other preprocedural examination: Secondary | ICD-10-CM | POA: Diagnosis not present

## 2018-09-21 LAB — SARS CORONAVIRUS 2 (TAT 6-24 HRS): SARS Coronavirus 2: NEGATIVE

## 2018-09-24 MED ORDER — CEFAZOLIN SODIUM-DEXTROSE 2-4 GM/100ML-% IV SOLN
2.0000 g | Freq: Once | INTRAVENOUS | Status: AC
  Administered 2018-09-25: 2 g via INTRAVENOUS

## 2018-09-25 ENCOUNTER — Encounter
Admission: RE | Disposition: A | Discharge status: Home / Self Care | Payer: Self-pay | Source: Home / Self Care | Attending: Surgery

## 2018-09-25 ENCOUNTER — Other Ambulatory Visit: Payer: Self-pay

## 2018-09-25 ENCOUNTER — Ambulatory Visit: Payer: Medicare HMO

## 2018-09-25 ENCOUNTER — Ambulatory Visit: Payer: Medicare HMO | Admitting: Anesthesiology

## 2018-09-25 ENCOUNTER — Ambulatory Visit
Admission: RE | Admit: 2018-09-25 | Discharge: 2018-09-25 | Disposition: A | Discharge status: Home / Self Care | Payer: Medicare HMO | Attending: Surgery | Admitting: Surgery

## 2018-09-25 DIAGNOSIS — Z96653 Presence of artificial knee joint, bilateral: Secondary | ICD-10-CM | POA: Diagnosis not present

## 2018-09-25 DIAGNOSIS — M19012 Primary osteoarthritis, left shoulder: Secondary | ICD-10-CM | POA: Insufficient documentation

## 2018-09-25 DIAGNOSIS — M7542 Impingement syndrome of left shoulder: Secondary | ICD-10-CM | POA: Diagnosis not present

## 2018-09-25 DIAGNOSIS — K219 Gastro-esophageal reflux disease without esophagitis: Secondary | ICD-10-CM | POA: Diagnosis not present

## 2018-09-25 DIAGNOSIS — E039 Hypothyroidism, unspecified: Secondary | ICD-10-CM | POA: Diagnosis not present

## 2018-09-25 DIAGNOSIS — M797 Fibromyalgia: Secondary | ICD-10-CM | POA: Diagnosis not present

## 2018-09-25 DIAGNOSIS — I10 Essential (primary) hypertension: Secondary | ICD-10-CM | POA: Diagnosis not present

## 2018-09-25 DIAGNOSIS — M1990 Unspecified osteoarthritis, unspecified site: Secondary | ICD-10-CM | POA: Insufficient documentation

## 2018-09-25 DIAGNOSIS — M619 Calcification and ossification of muscle, unspecified: Secondary | ICD-10-CM | POA: Insufficient documentation

## 2018-09-25 DIAGNOSIS — Z888 Allergy status to other drugs, medicaments and biological substances status: Secondary | ICD-10-CM | POA: Diagnosis not present

## 2018-09-25 DIAGNOSIS — Z87891 Personal history of nicotine dependence: Secondary | ICD-10-CM | POA: Insufficient documentation

## 2018-09-25 DIAGNOSIS — Z79890 Hormone replacement therapy (postmenopausal): Secondary | ICD-10-CM | POA: Insufficient documentation

## 2018-09-25 DIAGNOSIS — Z881 Allergy status to other antibiotic agents status: Secondary | ICD-10-CM | POA: Insufficient documentation

## 2018-09-25 DIAGNOSIS — Z79899 Other long term (current) drug therapy: Secondary | ICD-10-CM | POA: Insufficient documentation

## 2018-09-25 HISTORY — PX: SHOULDER ARTHROSCOPY WITH DEBRIDEMENT AND BICEP TENDON REPAIR: SHX5690

## 2018-09-25 SURGERY — SHOULDER ARTHROSCOPY WITH DEBRIDEMENT AND BICEP TENDON REPAIR
Anesthesia: General | Site: Shoulder | Laterality: Left

## 2018-09-25 MED ORDER — LIDOCAINE HCL (CARDIAC) PF 100 MG/5ML IV SOSY
PREFILLED_SYRINGE | INTRAVENOUS | Status: DC | PRN
  Administered 2018-09-25: 100 mg via INTRAVENOUS

## 2018-09-25 MED ORDER — EPINEPHRINE (ANAPHYLAXIS) 30 MG/30ML IJ SOLN
INTRAMUSCULAR | Status: AC
  Filled 2018-09-25: qty 30

## 2018-09-25 MED ORDER — FENTANYL CITRATE (PF) 250 MCG/5ML IJ SOLN
INTRAMUSCULAR | Status: AC
  Filled 2018-09-25: qty 5

## 2018-09-25 MED ORDER — PHENYLEPHRINE HCL-NACL 10-0.9 MG/250ML-% IV SOLN
INTRAVENOUS | Status: DC | PRN
  Administered 2018-09-25: 30 ug/min via INTRAVENOUS

## 2018-09-25 MED ORDER — FENTANYL CITRATE (PF) 100 MCG/2ML IJ SOLN: 25.0000 ug | Freq: Once | INTRAMUSCULAR | Status: DC

## 2018-09-25 MED ORDER — ONDANSETRON HCL 4 MG/2ML IJ SOLN
INTRAMUSCULAR | Status: DC | PRN
  Administered 2018-09-25: 4 mg via INTRAVENOUS

## 2018-09-25 MED ORDER — BUPIVACAINE LIPOSOME 1.3 % IJ SUSP
INTRAMUSCULAR | Status: AC
  Filled 2018-09-25: qty 20

## 2018-09-25 MED ORDER — BUPIVACAINE-EPINEPHRINE (PF) 0.5% -1:200000 IJ SOLN
INTRAMUSCULAR | Status: DC | PRN
  Administered 2018-09-25: 30 mL

## 2018-09-25 MED ORDER — SUGAMMADEX SODIUM 200 MG/2ML IV SOLN
INTRAVENOUS | Status: DC | PRN
  Administered 2018-09-25: 500 mg via INTRAVENOUS

## 2018-09-25 MED ORDER — ONDANSETRON HCL 4 MG/2ML IJ SOLN: 4.0000 mg | Freq: Four times a day (QID) | INTRAMUSCULAR | Status: DC | PRN

## 2018-09-25 MED ORDER — LIDOCAINE HCL (PF) 2 % IJ SOLN
INTRAMUSCULAR | Status: AC
  Filled 2018-09-25: qty 10

## 2018-09-25 MED ORDER — FENTANYL CITRATE (PF) 100 MCG/2ML IJ SOLN
INTRAMUSCULAR | Status: AC
  Administered 2018-09-25: 50 ug
  Filled 2018-09-25: qty 2

## 2018-09-25 MED ORDER — LIDOCAINE HCL (PF) 1 % IJ SOLN
INTRAMUSCULAR | Status: AC
  Filled 2018-09-25: qty 5

## 2018-09-25 MED ORDER — MIDAZOLAM HCL 2 MG/2ML IJ SOLN
INTRAMUSCULAR | Status: AC
  Administered 2018-09-25: 1 mg
  Filled 2018-09-25: qty 2

## 2018-09-25 MED ORDER — METOCLOPRAMIDE HCL 10 MG PO TABS: 5.0000 mg | ORAL_TABLET | Freq: Three times a day (TID) | ORAL | Status: DC | PRN

## 2018-09-25 MED ORDER — CEFAZOLIN SODIUM-DEXTROSE 2-4 GM/100ML-% IV SOLN
INTRAVENOUS | Status: AC
  Filled 2018-09-25: qty 100

## 2018-09-25 MED ORDER — ONDANSETRON HCL 4 MG/2ML IJ SOLN: 4.0000 mg | Freq: Once | INTRAMUSCULAR | Status: DC | PRN

## 2018-09-25 MED ORDER — ROCURONIUM BROMIDE 100 MG/10ML IV SOLN
INTRAVENOUS | Status: DC | PRN
  Administered 2018-09-25: 100 mg via INTRAVENOUS

## 2018-09-25 MED ORDER — BUPIVACAINE-EPINEPHRINE (PF) 0.5% -1:200000 IJ SOLN
INTRAMUSCULAR | Status: AC
  Filled 2018-09-25: qty 30

## 2018-09-25 MED ORDER — OXYCODONE HCL 5 MG PO TABS: 5.0000 mg | ORAL_TABLET | ORAL | 0 refills | Status: DC | PRN

## 2018-09-25 MED ORDER — ROCURONIUM BROMIDE 50 MG/5ML IV SOLN
INTRAVENOUS | Status: AC
  Filled 2018-09-25: qty 2

## 2018-09-25 MED ORDER — PROPOFOL 10 MG/ML IV BOLUS
INTRAVENOUS | Status: AC
  Filled 2018-09-25: qty 20

## 2018-09-25 MED ORDER — BUPIVACAINE HCL (PF) 0.5 % IJ SOLN
INTRAMUSCULAR | Status: AC
  Filled 2018-09-25: qty 10

## 2018-09-25 MED ORDER — POTASSIUM CHLORIDE IN NACL 20-0.9 MEQ/L-% IV SOLN
INTRAVENOUS | Status: DC
  Filled 2018-09-25: qty 1000

## 2018-09-25 MED ORDER — CELECOXIB 200 MG PO CAPS: 200.0000 mg | ORAL_CAPSULE | Freq: Two times a day (BID) | ORAL | 1 refills | Status: DC

## 2018-09-25 MED ORDER — ONDANSETRON HCL 4 MG PO TABS: 4.0000 mg | ORAL_TABLET | Freq: Four times a day (QID) | ORAL | Status: DC | PRN

## 2018-09-25 MED ORDER — LACTATED RINGERS IV SOLN
INTRAVENOUS | Status: DC
  Administered 2018-09-25: 09:00:00 via INTRAVENOUS

## 2018-09-25 MED ORDER — LACTATED RINGERS IV SOLN
INTRAVENOUS | Status: DC | PRN
  Administered 2018-09-25: 12:00:00 1 mL

## 2018-09-25 MED ORDER — LIDOCAINE HCL (PF) 1 % IJ SOLN
INTRAMUSCULAR | Status: DC | PRN
  Administered 2018-09-25: 3 mL

## 2018-09-25 MED ORDER — BUPIVACAINE LIPOSOME 1.3 % IJ SUSP
INTRAMUSCULAR | Status: DC | PRN
  Administered 2018-09-25: 20 mL via PERINEURAL

## 2018-09-25 MED ORDER — PROPOFOL 10 MG/ML IV BOLUS
INTRAVENOUS | Status: DC | PRN
  Administered 2018-09-25: 170 mg via INTRAVENOUS

## 2018-09-25 MED ORDER — METOCLOPRAMIDE HCL 5 MG/ML IJ SOLN: 5.0000 mg | Freq: Three times a day (TID) | INTRAMUSCULAR | Status: DC | PRN

## 2018-09-25 MED ORDER — ACETAMINOPHEN 10 MG/ML IV SOLN
INTRAVENOUS | Status: AC
  Filled 2018-09-25: qty 100

## 2018-09-25 MED ORDER — MIDAZOLAM HCL 2 MG/2ML IJ SOLN: 1.0000 mg | Freq: Once | INTRAMUSCULAR | Status: DC

## 2018-09-25 MED ORDER — BUPIVACAINE HCL (PF) 0.5 % IJ SOLN
INTRAMUSCULAR | Status: DC | PRN
  Administered 2018-09-25: 10 mL via PERINEURAL

## 2018-09-25 MED ORDER — EPINEPHRINE PF 1 MG/ML IJ SOLN
INTRAMUSCULAR | Status: AC
  Filled 2018-09-25: qty 2

## 2018-09-25 MED ORDER — FENTANYL CITRATE (PF) 100 MCG/2ML IJ SOLN: 25.0000 ug | INTRAMUSCULAR | Status: DC | PRN

## 2018-09-25 MED ORDER — OXYCODONE HCL 5 MG PO TABS
5.0000 mg | ORAL_TABLET | ORAL | Status: DC | PRN
  Filled 2018-09-25: qty 2

## 2018-09-25 SURGICAL SUPPLY — 41 items
BIT DRILL JUGRKNT W/NDL BIT2.9 (DRILL) IMPLANT
BLADE FULL RADIUS 3.5 (BLADE) ×2 IMPLANT
BUR ACROMIONIZER 4.0 (BURR) ×2 IMPLANT
CANNULA SHAVER 8MMX76MM (CANNULA) ×2 IMPLANT
CHLORAPREP W/TINT 26 (MISCELLANEOUS) ×2 IMPLANT
COVER MAYO STAND REUSABLE (DRAPES) ×2 IMPLANT
COVER WAND RF STERILE (DRAPES) ×2 IMPLANT
DRAPE SPLIT 6X30 W/TAPE (DRAPES) ×4 IMPLANT
DRILL JUGGERKNOT W/NDL BIT 2.9 (DRILL)
ELECT REM PT RETURN 9FT ADLT (ELECTROSURGICAL) ×2
ELECTRODE REM PT RTRN 9FT ADLT (ELECTROSURGICAL) ×1 IMPLANT
GAUZE SPONGE 4X4 12PLY STRL (GAUZE/BANDAGES/DRESSINGS) ×2 IMPLANT
GAUZE XEROFORM 1X8 LF (GAUZE/BANDAGES/DRESSINGS) ×2 IMPLANT
GLOVE BIO SURGEON STRL SZ7.5 (GLOVE) ×4 IMPLANT
GLOVE BIO SURGEON STRL SZ8 (GLOVE) ×4 IMPLANT
GLOVE BIOGEL PI IND STRL 8 (GLOVE) ×1 IMPLANT
GLOVE BIOGEL PI INDICATOR 8 (GLOVE) ×1
GLOVE INDICATOR 8.0 STRL GRN (GLOVE) ×2 IMPLANT
GOWN STRL REUS W/ TWL LRG LVL3 (GOWN DISPOSABLE) ×1 IMPLANT
GOWN STRL REUS W/ TWL XL LVL3 (GOWN DISPOSABLE) ×1 IMPLANT
GOWN STRL REUS W/TWL LRG LVL3 (GOWN DISPOSABLE) ×1
GOWN STRL REUS W/TWL XL LVL3 (GOWN DISPOSABLE) ×1
GRASPER SUT 15 45D LOW PRO (SUTURE) IMPLANT
IV LACTATED RINGER IRRG 3000ML (IV SOLUTION) ×1
IV LR IRRIG 3000ML ARTHROMATIC (IV SOLUTION) ×1 IMPLANT
MANIFOLD NEPTUNE II (INSTRUMENTS) ×2 IMPLANT
MASK FACE SPIDER DISP (MASK) ×2 IMPLANT
MAT ABSORB  FLUID 56X50 GRAY (MISCELLANEOUS) ×1
MAT ABSORB FLUID 56X50 GRAY (MISCELLANEOUS) ×1 IMPLANT
NEEDLE HYPO 22GX1.5 SAFETY (NEEDLE) ×2 IMPLANT
PACK ARTHROSCOPY SHOULDER (MISCELLANEOUS) ×2 IMPLANT
SLING ARM LRG DEEP (SOFTGOODS) ×2 IMPLANT
SLING ULTRA II LG (MISCELLANEOUS) ×2 IMPLANT
STAPLER SKIN PROX 35W (STAPLE) ×2 IMPLANT
STRAP SAFETY 5IN WIDE (MISCELLANEOUS) ×2 IMPLANT
SUT ETHIBOND 0 MO6 C/R (SUTURE) ×2 IMPLANT
SUT VIC AB 2-0 CT1 27 (SUTURE) ×2
SUT VIC AB 2-0 CT1 TAPERPNT 27 (SUTURE) ×2 IMPLANT
TAPE MICROFOAM 4IN (TAPE) ×2 IMPLANT
TUBING ARTHRO INFLOW-ONLY STRL (TUBING) ×2 IMPLANT
WAND WEREWOLF FLOW 90D (MISCELLANEOUS) ×2 IMPLANT

## 2018-09-25 NOTE — Anesthesia Post-op Follow-up Note (Signed)
Anesthesia QCDR form completed.        

## 2018-09-25 NOTE — Transfer of Care (Signed)
Immediate Anesthesia Transfer of Care Note  Patient: Glenda Gomez  Procedure(s) Performed: SHOULDER ARTHROSCOPY WITH DEBRIDEMENT DECOMPRESSION AND BICEP TENDON REPAIR - SCRUB TECH (Left Shoulder)  Patient Location: PACU  Anesthesia Type:General  Level of Consciousness: awake, alert  and oriented  Airway & Oxygen Therapy: Patient Spontanous Breathing and Patient connected to nasal cannula oxygen  Post-op Assessment: Report given to RN and Post -op Vital signs reviewed and stable  Post vital signs: Reviewed and stable  Last Vitals:  Vitals Value Taken Time  BP 155/65 09/25/18 1216  Temp    Pulse 68 09/25/18 1215  Resp 10 09/25/18 1216  SpO2 89 % 09/25/18 1215  Vitals shown include unvalidated device data.  Last Pain:  Vitals:   09/25/18 0822  TempSrc: Temporal  PainSc: 4          Complications: No apparent anesthesia complications

## 2018-09-25 NOTE — Anesthesia Procedure Notes (Signed)
Procedure Name: Intubation Date/Time: 09/25/2018 11:03 AM Performed by: Bernardo Heater, CRNA Pre-anesthesia Checklist: Patient identified, Emergency Drugs available, Suction available and Patient being monitored Patient Re-evaluated:Patient Re-evaluated prior to induction Oxygen Delivery Method: Circle system utilized Preoxygenation: Pre-oxygenation with 100% oxygen Induction Type: IV induction Laryngoscope Size: Mac and 3 Grade View: Grade I Tube type: Oral Tube size: 7.0 mm Number of attempts: 1 Placement Confirmation: ETT inserted through vocal cords under direct vision,  positive ETCO2 and breath sounds checked- equal and bilateral Secured at: 22 cm Tube secured with: Tape Dental Injury: Teeth and Oropharynx as per pre-operative assessment

## 2018-09-25 NOTE — Anesthesia Procedure Notes (Addendum)
Anesthesia Regional Block: Interscalene brachial plexus block   Pre-Anesthetic Checklist: ,, timeout performed, Correct Patient, Correct Site, Correct Laterality, Correct Procedure, Correct Position, site marked, Risks and benefits discussed,  Surgical consent,  Pre-op evaluation,  At surgeon's request and post-op pain management  Laterality: Left  Prep: chloraprep       Needles:  Injection technique: Single-shot  Needle Type: Stimiplex     Needle Length: 10cm  Needle Gauge: 21     Additional Needles:   Procedures:,,,, ultrasound used (permanent image in chart),,,,  Narrative:  Start time: 09/25/2018 9:25 AM End time: 09/25/2018 9:30 AM Injection made incrementally with aspirations every 5 mL.  Performed by: Personally  Anesthesiologist: Emmie Niemann, MD  Additional Notes: Functioning IV was confirmed and monitors were applied.  A Stimuplex needle was used. Sterile prep and drape,hand hygiene and sterile gloves were used.  Negative aspiration and negative test dose prior to incremental administration of local anesthetic. The patient tolerated the procedure well.

## 2018-09-25 NOTE — H&P (Signed)
Paper H&P to be scanned into permanent record. H&P reviewed and patient re-examined. No changes. 

## 2018-09-25 NOTE — OR Nursing (Signed)
Patient able to move fingers and hand on surgical side LEFT, fingers warm to touch cap refill brisk.

## 2018-09-25 NOTE — Op Note (Signed)
09/25/2018  12:20 PM  Patient:   Glenda Gomez  Pre-Op Diagnosis:   Impingement/tendinopathy, left shoulder.  Post-Op Diagnosis:   Impingement/tendinopathy with degenerative labral fraying, early degenerative joint disease, and biceps tendinopathy, left shoulder.  Procedure:   Extensive arthroscopic debridement with biceps tenolysis and arthroscopic subacromial decompression, left shoulder.  Anesthesia:   General endotracheal with interscalene block using Exparel placed preoperatively by the anesthesiologist.  Surgeon:   Pascal Lux, MD  Assistant:   Freddie Apley, PA-S  Findings:   As above. There was substantial labral fraying involving the anterosuperior, superior, and posterosuperior portions of the labrum without frank detachment from the glenoid. The biceps tendon demonstrated extensive fraying with partial-thickness tearing. There was evidence of mild fraying of the anterior insertional fibers of the supraspinatus tendon involving less than 10 to 15% of the footprint. There were diffuse grade 2-3 chondromalacial changes involving the humeral head. The articular surface of the glenoid was in satisfactory condition.  Complications:   None  Fluids:   500 cc  Estimated blood loss:   3 cc  Tourniquet time:   None  Drains:   None  Closure:   Staples      Brief clinical note:   The patient is a 68 year old female with a history of gradually worsening left shoulder pain. The patient's symptoms have progressed despite medications, activity modification, etc. The patient's history and examination are consistent with impingement/tendinopathy. Her preoperative MRI scan confirmed the presence of tendinopathy without evidence for substantial partial or full-thickness tears. The patient presents at this time for definitive management of these shoulder symptoms.  Procedure:   The patient underwent placement of an interscalene block using Exparel by the anesthesiologist in the preoperative  holding area before being brought into the operating room and lain in the supine position. The patient then underwent general endotracheal intubation and anesthesia before being repositioned in the beach chair position using the beach chair positioner. The left shoulder and upper extremity were prepped with ChloraPrep solution before being draped sterilely. Preoperative antibiotics were administered. A timeout was performed to confirm the proper surgical site before the expected portal sites and incision site were injected with 0.5% Sensorcaine with epinephrine. A posterior portal was created and the glenohumeral joint thoroughly inspected with the findings as described above. An anterior portal was created using an outside-in technique. The labrum and rotator cuff were further probed, again confirming the above-noted findings. The areas of labral fraying were debrided back to stable margins using the full-radius resector, as were areas of synovitis. The ArthroCare wand was inserted and used to release the biceps tendon from its labral anchor. It also was used to obtain hemostasis as well as to "anneal" the labrum superiorly and anteriorly. Finally, areas of grade III chondromalacia were lightly "annealed" using the ArthroCare wand. The instruments were removed from the joint after suctioning the excess fluid.  The camera was repositioned through the posterior portal into the subacromial space. A separate lateral portal was created using an outside-in technique. The 3.5 mm full-radius resector was introduced and used to perform a subtotal bursectomy. The ArthroCare wand was then inserted and used to remove the periosteal tissue off the undersurface of the anterior third of the acromion as well as to recess the coracoacromial ligament from its attachment along the anterior and lateral margins of the acromion. The 4.0 mm acromionizing bur was introduced and used to complete the decompression by removing the  undersurface of the anterior third of the acromion. The  full radius resector was reintroduced to remove any residual bony debris before the ArthroCare wand was reintroduced to obtain hemostasis. The instruments were then removed from the subacromial space after suctioning the excess fluid.  The portal sites were closed using staples. A sterile bulky dressing was applied to the shoulder before the arm was placed into a shoulder sling. The patient was then awakened, extubated, and returned to the recovery room in satisfactory condition after tolerating the procedure well.

## 2018-09-25 NOTE — Anesthesia Postprocedure Evaluation (Signed)
Anesthesia Post Note  Patient: Eyvonne C Meyers  Procedure(s) Performed: SHOULDER ARTHROSCOPY WITH DEBRIDEMENT DECOMPRESSION AND BICEP TENDON REPAIR - SCRUB TECH (Left Shoulder)  Patient location during evaluation: PACU Anesthesia Type: General Level of consciousness: awake and alert and oriented Pain management: pain level controlled Vital Signs Assessment: post-procedure vital signs reviewed and stable Respiratory status: spontaneous breathing, nonlabored ventilation and respiratory function stable Cardiovascular status: blood pressure returned to baseline and stable Postop Assessment: no signs of nausea or vomiting Anesthetic complications: no     Last Vitals:  Vitals:   09/25/18 1246 09/25/18 1259  BP: (!) 156/62 (!) 156/75  Pulse: (!) 50 61  Resp: 14 18  Temp: (!) 36.2 C (!) 36.1 C  SpO2: 99% 97%    Last Pain:  Vitals:   09/25/18 1259  TempSrc: Temporal  PainSc: 0-No pain                 Kahleb Mcclane

## 2018-09-25 NOTE — Anesthesia Preprocedure Evaluation (Signed)
Anesthesia Evaluation  Patient identified by MRN, date of birth, ID band Patient awake    Reviewed: Allergy & Precautions, NPO status , Patient's Chart, lab work & pertinent test results  History of Anesthesia Complications Negative for: history of anesthetic complications  Airway Mallampati: II  TM Distance: >3 FB Neck ROM: Full    Dental  (+) Implants   Pulmonary neg sleep apnea, neg COPD, former smoker,    breath sounds clear to auscultation- rhonchi (-) wheezing      Cardiovascular hypertension, Pt. on medications (-) CAD, (-) Past MI, (-) Cardiac Stents and (-) CABG  Rhythm:Regular Rate:Normal - Systolic murmurs and - Diastolic murmurs    Neuro/Psych neg Seizures negative neurological ROS  negative psych ROS   GI/Hepatic Neg liver ROS, GERD  ,  Endo/Other  neg diabetesHypothyroidism   Renal/GU negative Renal ROS     Musculoskeletal  (+) Arthritis , Fibromyalgia -  Abdominal (+) + obese,   Peds  Hematology negative hematology ROS (+)   Anesthesia Other Findings Past Medical History: No date: Arthritis No date: Chicken pox No date: Dyspnea No date: Environmental and seasonal allergies No date: Fibromyalgia No date: GERD (gastroesophageal reflux disease) No date: Hemorrhoid No date: Hypersomnia No date: Hypertension No date: Hypothyroidism 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 No date: Osteoarthritis No date: Thyroid disease   Reproductive/Obstetrics                             Anesthesia Physical Anesthesia Plan  ASA: II  Anesthesia Plan: General   Post-op Pain Management:  Regional for Post-op pain   Induction: Intravenous  PONV Risk Score and Plan: 2 and Ondansetron, Dexamethasone and Midazolam  Airway Management Planned: Oral ETT  Additional Equipment:   Intra-op Plan:   Post-operative Plan: Extubation in OR  Informed Consent: I have reviewed the patients History  and Physical, chart, labs and discussed the procedure including the risks, benefits and alternatives for the proposed anesthesia with the patient or authorized representative who has indicated his/her understanding and acceptance.     Dental advisory given  Plan Discussed with: CRNA and Anesthesiologist  Anesthesia Plan Comments:         Anesthesia Quick Evaluation

## 2018-09-25 NOTE — Discharge Instructions (Addendum)
Orthopedic discharge instructions: Keep dressing dry and intact.  May shower after dressing changed on post-op day #4 (Saturday).  Cover staples with Band-Aids after drying off. Apply ice frequently to shoulder. Take Celebrex 200 mg BID with meals for 7-10 days, then as necessary. Take oxycodone as prescribed when needed.  May supplement with ES Tylenol if necessary. Keep shoulder sling on until block has worn off.   May then use the sling only as necessary for comfort. Follow-up in 10-14 days or as scheduled.  AMBULATORY SURGERY  DISCHARGE INSTRUCTIONS   1) The drugs that you were given will stay in your system until tomorrow so for the next 24 hours you should not:  A) Drive an automobile B) Make any legal decisions C) Drink any alcoholic beverage   2) You may resume regular meals tomorrow.  Today it is better to start with liquids and gradually work up to solid foods.  You may eat anything you prefer, but it is better to start with liquids, then soup and crackers, and gradually work up to solid foods.   3) Please notify your doctor immediately if you have any unusual bleeding, trouble breathing, redness and pain at the surgery site, drainage, fever, or pain not relieved by medication.    4) Additional Instructions:        Please contact your physician with any problems or Same Day Surgery at [phone], Monday through Friday 6 am to 4 pm, or [place] at [facility] number at [phone].     Interscalene Nerve Block with Exparel  1.  For your surgery you have received an Interscalene Nerve Block with Exparel. 2. Nerve Blocks affect many types of nerves, including nerves that control movement, pain and normal sensation.  You may experience feelings such as numbness, tingling, heaviness, weakness or the inability to move your arm or the feeling or sensation that your arm has "fallen asleep". 3. A nerve block with Exparel can last up to 5 days.  Usually the  weakness wears off first.  The tingling and heaviness usually wear off next.  Finally you may start to notice pain.  Keep in mind that this may occur in any order.  Once a nerve block starts to wear off it is usually completely gone within 60 minutes. 4. ISNB may cause mild shortness of breath, a hoarse voice, blurry vision, unequal pupils, or drooping of the face on the same side as the nerve block.  These symptoms will usually resolve with the numbness.  Very rarely the procedure itself can cause mild seizures. 5. If needed, your surgeon will give you a prescription for pain medication.  It will take about 60 minutes for the oral pain medication to become fully effective.  So, it is recommended that you start taking this medication before the nerve block first begins to wear off, or when you first begin to feel discomfort. 6. Take your pain medication only as prescribed.  Pain medication can cause sedation and decrease your breathing if you take more than you need for the level of pain that you have. 7. Nausea is a common side effect of many pain medications.  You may want to eat something before taking your pain medicine to prevent nausea. 8. After an Interscalene nerve block, you cannot feel pain, pressure or extremes in temperature in the effected arm.  Because your arm is numb it is at an increased risk for injury.  To decrease the possibility of injury, please practice the following:  a. While you are awake change the position of your arm frequently to prevent too much pressure on any one area for prolonged periods of time. b.  If you have a cast or tight dressing, check the color or your fingers every couple of hours.  Call your surgeon with the appearance of any discoloration (white or blue). c. If you are given a sling to wear before you go home, please wear it  at all times until the block has completely worn off.  Do not get up at night without your sling. d. Please contact [facility] or  your surgeon if you do not begin to regain sensation after 7 days from the surgery.  Anesthesia may be contacted by calling the Same Day Surgery Department, Mon. through Fri., 6 am to 4 pm at [phone].   e. If you experience any other problems or concerns, please contact your surgeon's office. f. If you experience severe or prolonged shortness of breath go to the nearest emergency department.

## 2018-09-28 DIAGNOSIS — M24112 Other articular cartilage disorders, left shoulder: Secondary | ICD-10-CM | POA: Insufficient documentation

## 2018-09-28 DIAGNOSIS — M7522 Bicipital tendinitis, left shoulder: Secondary | ICD-10-CM | POA: Insufficient documentation

## 2019-01-25 ENCOUNTER — Other Ambulatory Visit: Payer: Self-pay

## 2019-01-25 ENCOUNTER — Other Ambulatory Visit
Admission: RE | Admit: 2019-01-25 | Discharge: 2019-01-25 | Disposition: A | Discharge status: Home / Self Care | Payer: Medicare HMO | Source: Home / Self Care | Attending: Pulmonary Disease | Admitting: Pulmonary Disease

## 2019-01-25 ENCOUNTER — Ambulatory Visit
Admission: RE | Admit: 2019-01-25 | Discharge: 2019-01-25 | Disposition: A | Discharge status: Home / Self Care | Payer: Medicare HMO | Source: Ambulatory Visit | Attending: Pulmonary Disease | Admitting: Pulmonary Disease

## 2019-01-25 DIAGNOSIS — J9801 Acute bronchospasm: Secondary | ICD-10-CM | POA: Insufficient documentation

## 2019-01-25 LAB — BUN: BUN: 15 mg/dL (ref 8–23)

## 2019-01-25 LAB — CREATININE, SERUM
Creatinine, Ser: 0.76 mg/dL (ref 0.44–1.00)
GFR calc Af Amer: >60 mL/min (ref >60)
GFR calc non Af Amer: >60 mL/min (ref >60)

## 2019-01-25 MED ORDER — IOHEXOL 300 MG/ML  SOLN
75.0000 mL | Freq: Once | INTRAMUSCULAR | Status: AC | PRN
  Administered 2019-01-25: 75 mL via INTRAVENOUS

## 2019-02-12 ENCOUNTER — Other Ambulatory Visit: Payer: Self-pay

## 2019-02-12 ENCOUNTER — Encounter
Admission: RE | Admit: 2019-02-12 | Discharge: 2019-02-12 | Disposition: A | Discharge status: Home / Self Care | Payer: Medicare HMO | Source: Ambulatory Visit | Attending: Pulmonary Disease | Admitting: Pulmonary Disease

## 2019-02-12 NOTE — Patient Instructions (Signed)
Your procedure is scheduled [id number]  02/22/19 Report to [facility]. To find out your arrival time please call [phone] between 1PM - 3PM on Thursday 02/21/19.  Remember: Instructions that are not followed completely may result in serious medical risk, up to and including death, or upon the discretion of your surgeon and anesthesiologist your surgery may need to be rescheduled.     _X__ 1. Do not eat food after midnight the night before your procedure.                 No gum chewing or hard candies. You may drink clear liquids up to 2 hours                 before you are scheduled to arrive for your surgery- DO not drink clear                 liquids within 2 hours of the start of your surgery.                 Clear Liquids include:  water, apple juice without pulp, clear carbohydrate                 drink such as Clearfast or Gatorade, Black Coffee or Tea (Do not add                 anything to coffee or tea). Diabetics water only  __X__2.  On the morning of surgery brush your teeth with toothpaste and water, you                 may rinse your mouth with mouthwash if you wish.  Do not swallow any              toothpaste of mouthwash.     _X__ 3.  No Alcohol for 24 hours before or after surgery.   _X__ 4.  Do Not Smoke or use e-cigarettes For 24 Hours Prior to Your Surgery.                 Do not use any chewable tobacco products for at least 6 hours prior to                 surgery.  ____  5.  Bring all medications with you on the day of surgery if instructed.   __X__  6.  Notify your doctor if there is any change in your medical condition      (cold, fever, infections).     Do not wear jewelry, make-up, hairpins, clips or nail polish. Do not wear lotions, powders, or perfumes.  Do not shave 48 hours prior to surgery. Men may shave face and neck. Do not bring valuables to the hospital.    [facility] is not responsible for any  belongings or valuables.  Contacts, dentures/partials or body piercings may not be worn into surgery. Bring a case for your contacts, glasses or hearing aids, a denture cup will be supplied. Leave your suitcase in the car. After surgery it may be brought to your room. For patients admitted to the hospital, discharge time is determined by your treatment team.   Patients discharged the day of surgery will not be allowed to drive home.   Please read over the following fact sheets that you were given:   MRSA Information  __X__ Take these medicines the morning of surgery with A SIP OF WATER:  1. LEVOTHYROXINE  2. PRILOSEC  3.   4.  5.  6.  ____ Fleet Enema (as directed)   ____ Use CHG Soap/SAGE wipes as directed  __X__ Use inhalers on the day of surgery  VENTOLIN  ____ Stop metformin/Janumet/Farxiga 2 days prior to surgery    ____ Take 1/2 of usual insulin dose the night before surgery. No insulin the morning          of surgery.   ____ Stop Blood Thinners Coumadin/Plavix/Xarelto/Pleta/Pradaxa/Eliquis/Effient/Aspirin  on   Or contact your Surgeon, Cardiologist or Medical Doctor regarding  ability to stop your blood thinners  __X__ Stop Anti-inflammatories 7 days before surgery such as Advil, Ibuprofen, Motrin,  BC or Goodies Powder, Naprosyn, Naproxen, Aleve, Aspirin    __X__ Stop all herbal supplements, fish oil or vitamin E until after surgery.    ____ Bring C-Pap to the hospital.

## 2019-02-13 ENCOUNTER — Encounter
Admission: RE | Admit: 2019-02-13 | Discharge: 2019-02-13 | Disposition: A | Discharge status: Home / Self Care | Payer: Medicare HMO | Source: Ambulatory Visit | Attending: Pulmonary Disease | Admitting: Pulmonary Disease

## 2019-02-13 DIAGNOSIS — Z01818 Encounter for other preprocedural examination: Secondary | ICD-10-CM | POA: Diagnosis present

## 2019-02-13 LAB — CBC
HCT: 43 % (ref 36.0–46.0)
Hemoglobin: 14.3 g/dL (ref 12.0–15.0)
MCH: 26.3 pg (ref 26.0–34.0)
MCHC: 33.3 g/dL (ref 30.0–36.0)
MCV: 79.2 fL — ABNORMAL LOW (ref 80.0–100.0)
Platelets: 310 10*3/uL (ref 150–400)
RBC: 5.43 MIL/uL — ABNORMAL HIGH (ref 3.87–5.11)
RDW: 14.8 % (ref 11.5–15.5)
WBC: 9.8 10*3/uL (ref 4.0–10.5)
nRBC: 0 % (ref 0.0–0.2)

## 2019-02-13 LAB — APTT: aPTT: 28 seconds (ref 24–36)

## 2019-02-13 LAB — PROTIME-INR
INR: 0.9 (ref 0.8–1.2)
Prothrombin Time: 12.2 seconds (ref 11.4–15.2)

## 2019-02-15 DIAGNOSIS — Z902 Acquired absence of lung [part of]: Secondary | ICD-10-CM

## 2019-02-15 HISTORY — DX: Acquired absence of lung (part of): Z90.2

## 2019-02-20 ENCOUNTER — Other Ambulatory Visit: Payer: Medicare HMO

## 2019-02-28 ENCOUNTER — Other Ambulatory Visit: Admission: RE | Admit: 2019-02-28 | Payer: Medicare HMO | Source: Ambulatory Visit

## 2019-03-04 ENCOUNTER — Ambulatory Visit
Admission: RE | Admit: 2019-03-04 | Discharge: 2019-03-04 | Disposition: A | Discharge status: Home / Self Care | Payer: Medicare HMO | Attending: Pulmonary Disease | Admitting: Pulmonary Disease

## 2019-03-04 ENCOUNTER — Ambulatory Visit: Payer: Medicare HMO | Admitting: Anesthesiology

## 2019-03-04 ENCOUNTER — Other Ambulatory Visit: Payer: Self-pay

## 2019-03-04 ENCOUNTER — Ambulatory Visit: Payer: Medicare HMO

## 2019-03-04 ENCOUNTER — Encounter
Admission: RE | Disposition: A | Discharge status: Home / Self Care | Payer: Self-pay | Source: Home / Self Care | Attending: Pulmonary Disease

## 2019-03-04 ENCOUNTER — Encounter: Payer: Self-pay | Admitting: *Deleted

## 2019-03-04 DIAGNOSIS — Z6841 Body Mass Index (BMI) 40.0 and over, adult: Secondary | ICD-10-CM | POA: Diagnosis not present

## 2019-03-04 DIAGNOSIS — C3411 Malignant neoplasm of upper lobe, right bronchus or lung: Secondary | ICD-10-CM | POA: Diagnosis not present

## 2019-03-04 DIAGNOSIS — Z791 Long term (current) use of non-steroidal anti-inflammatories (NSAID): Secondary | ICD-10-CM | POA: Diagnosis not present

## 2019-03-04 DIAGNOSIS — I10 Essential (primary) hypertension: Secondary | ICD-10-CM | POA: Insufficient documentation

## 2019-03-04 DIAGNOSIS — Z800 Family history of malignant neoplasm of digestive organs: Secondary | ICD-10-CM | POA: Diagnosis not present

## 2019-03-04 DIAGNOSIS — Z9884 Bariatric surgery status: Secondary | ICD-10-CM | POA: Insufficient documentation

## 2019-03-04 DIAGNOSIS — Z87891 Personal history of nicotine dependence: Secondary | ICD-10-CM | POA: Insufficient documentation

## 2019-03-04 DIAGNOSIS — Z825 Family history of asthma and other chronic lower respiratory diseases: Secondary | ICD-10-CM | POA: Diagnosis not present

## 2019-03-04 DIAGNOSIS — Z79891 Long term (current) use of opiate analgesic: Secondary | ICD-10-CM | POA: Insufficient documentation

## 2019-03-04 DIAGNOSIS — M797 Fibromyalgia: Secondary | ICD-10-CM | POA: Insufficient documentation

## 2019-03-04 DIAGNOSIS — Z801 Family history of malignant neoplasm of trachea, bronchus and lung: Secondary | ICD-10-CM | POA: Insufficient documentation

## 2019-03-04 DIAGNOSIS — Z79899 Other long term (current) drug therapy: Secondary | ICD-10-CM | POA: Insufficient documentation

## 2019-03-04 DIAGNOSIS — K219 Gastro-esophageal reflux disease without esophagitis: Secondary | ICD-10-CM | POA: Insufficient documentation

## 2019-03-04 DIAGNOSIS — E039 Hypothyroidism, unspecified: Secondary | ICD-10-CM | POA: Diagnosis not present

## 2019-03-04 DIAGNOSIS — Z803 Family history of malignant neoplasm of breast: Secondary | ICD-10-CM | POA: Diagnosis not present

## 2019-03-04 DIAGNOSIS — Z96653 Presence of artificial knee joint, bilateral: Secondary | ICD-10-CM | POA: Insufficient documentation

## 2019-03-04 DIAGNOSIS — R911 Solitary pulmonary nodule: Secondary | ICD-10-CM | POA: Diagnosis present

## 2019-03-04 HISTORY — PX: VIDEO BRONCHOSCOPY WITH ENDOBRONCHIAL ULTRASOUND: SHX6177

## 2019-03-04 HISTORY — PX: VIDEO BRONCHOSCOPY WITH ENDOBRONCHIAL NAVIGATION: SHX6175

## 2019-03-04 SURGERY — VIDEO BRONCHOSCOPY WITH ENDOBRONCHIAL NAVIGATION
Anesthesia: General

## 2019-03-04 MED ORDER — EPHEDRINE SULFATE 50 MG/ML IJ SOLN
INTRAMUSCULAR | Status: AC
  Filled 2019-03-04: qty 1

## 2019-03-04 MED ORDER — ROCURONIUM BROMIDE 100 MG/10ML IV SOLN
INTRAVENOUS | Status: DC | PRN
  Administered 2019-03-04: 50 mg via INTRAVENOUS

## 2019-03-04 MED ORDER — PROPOFOL 10 MG/ML IV BOLUS
INTRAVENOUS | Status: AC
  Filled 2019-03-04: qty 20

## 2019-03-04 MED ORDER — LIDOCAINE HCL (CARDIAC) PF 100 MG/5ML IV SOSY
PREFILLED_SYRINGE | INTRAVENOUS | Status: DC | PRN
  Administered 2019-03-04: 100 mg via INTRAVENOUS

## 2019-03-04 MED ORDER — LACTATED RINGERS IV BOLUS
500.0000 mL | Freq: Once | INTRAVENOUS | Status: AC
  Administered 2019-03-04: 500 mL via INTRAVENOUS

## 2019-03-04 MED ORDER — LIDOCAINE HCL (PF) 2 % IJ SOLN
INTRAMUSCULAR | Status: AC
  Filled 2019-03-04: qty 10

## 2019-03-04 MED ORDER — ONDANSETRON HCL 4 MG/2ML IJ SOLN
INTRAMUSCULAR | Status: AC
  Filled 2019-03-04: qty 2

## 2019-03-04 MED ORDER — SUGAMMADEX SODIUM 500 MG/5ML IV SOLN
INTRAVENOUS | Status: DC | PRN
  Administered 2019-03-04: 500 mg via INTRAVENOUS

## 2019-03-04 MED ORDER — VASOPRESSIN 20 UNIT/ML IV SOLN
INTRAVENOUS | Status: DC | PRN
  Administered 2019-03-04: .5 [IU] via INTRAVENOUS
  Administered 2019-03-04: 1 [IU] via INTRAVENOUS
  Administered 2019-03-04: .5 [IU] via INTRAVENOUS

## 2019-03-04 MED ORDER — GLYCOPYRROLATE 0.2 MG/ML IJ SOLN
INTRAMUSCULAR | Status: DC | PRN
  Administered 2019-03-04: .2 mg via INTRAVENOUS

## 2019-03-04 MED ORDER — KETAMINE HCL 50 MG/ML IJ SOLN
INTRAMUSCULAR | Status: AC
  Filled 2019-03-04: qty 10

## 2019-03-04 MED ORDER — ROCURONIUM BROMIDE 50 MG/5ML IV SOLN
INTRAVENOUS | Status: AC
  Filled 2019-03-04: qty 1

## 2019-03-04 MED ORDER — BUPIVACAINE HCL (PF) 0.25 % IJ SOLN
INTRAMUSCULAR | Status: AC
  Filled 2019-03-04: qty 30

## 2019-03-04 MED ORDER — PHENYLEPHRINE HCL 0.25 % NA SOLN
1.0000 | Freq: Four times a day (QID) | NASAL | Status: DC | PRN
  Filled 2019-03-04: qty 15

## 2019-03-04 MED ORDER — DEXAMETHASONE SODIUM PHOSPHATE 10 MG/ML IJ SOLN
INTRAMUSCULAR | Status: AC
  Filled 2019-03-04: qty 1

## 2019-03-04 MED ORDER — PHENYLEPHRINE HCL (PRESSORS) 10 MG/ML IV SOLN
INTRAVENOUS | Status: DC | PRN
  Administered 2019-03-04: 100 ug via INTRAVENOUS
  Administered 2019-03-04: 200 ug via INTRAVENOUS
  Administered 2019-03-04: 150 ug via INTRAVENOUS

## 2019-03-04 MED ORDER — FENTANYL CITRATE (PF) 100 MCG/2ML IJ SOLN: 25.0000 ug | INTRAMUSCULAR | Status: DC | PRN

## 2019-03-04 MED ORDER — BUTAMBEN-TETRACAINE-BENZOCAINE 2-2-14 % EX AERO
1.0000 | INHALATION_SPRAY | Freq: Once | CUTANEOUS | Status: DC
  Filled 2019-03-04: qty 20

## 2019-03-04 MED ORDER — PROPOFOL 10 MG/ML IV BOLUS
INTRAVENOUS | Status: DC | PRN
  Administered 2019-03-04: 150 mg via INTRAVENOUS
  Administered 2019-03-04: 20 mg via INTRAVENOUS
  Administered 2019-03-04: 30 mg via INTRAVENOUS

## 2019-03-04 MED ORDER — PHENYLEPHRINE HCL (PRESSORS) 10 MG/ML IV SOLN
INTRAVENOUS | Status: AC
  Filled 2019-03-04: qty 1

## 2019-03-04 MED ORDER — SUCCINYLCHOLINE CHLORIDE 20 MG/ML IJ SOLN
INTRAMUSCULAR | Status: AC
  Filled 2019-03-04: qty 1

## 2019-03-04 MED ORDER — LIDOCAINE HCL URETHRAL/MUCOSAL 2 % EX GEL
1.0000 "application " | Freq: Once | CUTANEOUS | Status: DC
  Filled 2019-03-04: qty 5

## 2019-03-04 MED ORDER — FENTANYL CITRATE (PF) 100 MCG/2ML IJ SOLN
INTRAMUSCULAR | Status: AC
  Filled 2019-03-04: qty 2

## 2019-03-04 MED ORDER — LACTATED RINGERS IV SOLN: INTRAVENOUS | Status: DC

## 2019-03-04 MED ORDER — DEXAMETHASONE SODIUM PHOSPHATE 10 MG/ML IJ SOLN
INTRAMUSCULAR | Status: DC | PRN
  Administered 2019-03-04: 10 mg via INTRAVENOUS

## 2019-03-04 MED ORDER — FENTANYL CITRATE (PF) 100 MCG/2ML IJ SOLN
INTRAMUSCULAR | Status: DC | PRN
  Administered 2019-03-04: 50 ug via INTRAVENOUS
  Administered 2019-03-04: 25 ug via INTRAVENOUS

## 2019-03-04 MED ORDER — ONDANSETRON HCL 4 MG/2ML IJ SOLN: 4.0000 mg | Freq: Once | INTRAMUSCULAR | Status: DC | PRN

## 2019-03-04 MED ORDER — LIDOCAINE HCL (PF) 1 % IJ SOLN
30.0000 mL | Freq: Once | INTRAMUSCULAR | Status: DC
  Filled 2019-03-04: qty 30

## 2019-03-04 MED ORDER — VASOPRESSIN 20 UNIT/ML IV SOLN
INTRAVENOUS | Status: AC
  Filled 2019-03-04: qty 1

## 2019-03-04 MED ORDER — SUGAMMADEX SODIUM 500 MG/5ML IV SOLN
INTRAVENOUS | Status: AC
  Filled 2019-03-04: qty 5

## 2019-03-04 MED ORDER — ONDANSETRON HCL 4 MG/2ML IJ SOLN
INTRAMUSCULAR | Status: DC | PRN
  Administered 2019-03-04: 4 mg via INTRAVENOUS

## 2019-03-04 MED ORDER — EPHEDRINE SULFATE 50 MG/ML IJ SOLN
INTRAMUSCULAR | Status: DC | PRN
  Administered 2019-03-04: 10 mg via INTRAVENOUS
  Administered 2019-03-04 (×2): 5 mg via INTRAVENOUS

## 2019-03-04 MED ORDER — SODIUM CHLORIDE (PF) 0.9 % IJ SOLN
INTRAMUSCULAR | Status: AC
  Filled 2019-03-04: qty 10

## 2019-03-04 MED ORDER — KETAMINE HCL 10 MG/ML IJ SOLN
INTRAMUSCULAR | Status: DC | PRN
  Administered 2019-03-04: 20 mg via INTRAVENOUS
  Administered 2019-03-04: 10 mg via INTRAVENOUS

## 2019-03-04 MED ORDER — PHENYLEPHRINE HCL-NACL 10-0.9 MG/250ML-% IV SOLN
INTRAVENOUS | Status: DC | PRN
  Administered 2019-03-04: 30 ug/min via INTRAVENOUS

## 2019-03-04 NOTE — Discharge Instructions (Signed)
Flexible Bronchoscopy, Care After This sheet gives you information about how to care for yourself after your test. Your doctor may also give you more specific instructions. If you have problems or questions, contact your doctor. Follow these instructions at home: Eating and drinking  Do not eat or drink anything (not even water) for 2 hours after your test, or until your numbing medicine (local anesthetic) wears off.  When your numbness is gone and your cough and gag reflexes have come back, you may: ? Eat only soft foods. ? Slowly drink liquids.  The day after the test, go back to your normal diet. Driving  Do not drive for 24 hours if you were given a medicine to help you relax (sedative).  Do not drive or use heavy machinery while taking prescription pain medicine. General instructions   Take over-the-counter and prescription medicines only as told by your doctor.  Return to your normal activities as told. Ask what activities are safe for you.  Do not use any products that have nicotine or tobacco in them. This includes cigarettes and e-cigarettes. If you need help quitting, ask your doctor.  Keep all follow-up visits as told by your doctor. This is important. It is very important if you had a tissue sample (biopsy) taken. Get help right away if:  You have shortness of breath that gets worse.  You get light-headed.  You feel like you are going to pass out (faint).  You have chest pain.  You cough up: ? More than a little blood. ? More blood than before. Summary  Do not eat or drink anything (not even water) for 2 hours after your test, or until your numbing medicine wears off.  Do not use cigarettes. Do not use e-cigarettes.  Get help right away if you have chest pain. This information is not intended to replace advice given to you by your health care provider. Make sure you discuss any questions you have with your health care provider. Document Revised: 01/13/2017  Document Reviewed: 02/19/2016 Elsevier Patient Education  2020 [place]   1) The drugs that you were given will stay in your system until tomorrow so for the next 24 hours you should not:  A) Drive an automobile B) Make any legal decisions C) Drink any alcoholic beverage   2) You may resume regular meals tomorrow.  Today it is better to start with liquids and gradually work up to solid foods.  You may eat anything you prefer, but it is better to start with liquids, then soup and crackers, and gradually work up to solid foods.   3) Please notify your doctor immediately if you have any unusual bleeding, trouble breathing, redness and pain at the surgery site, drainage, fever, or pain not relieved by medication.    4) Additional Instructions:        Please contact your physician with any problems or Same Day Surgery at [phone], Monday through Friday 6 am to 4 pm, or [place] [facility] number at [phone].

## 2019-03-04 NOTE — Transfer of Care (Signed)
Immediate Anesthesia Transfer of Care Note  Patient: Glenda Gomez  Procedure(s) Performed: VIDEO BRONCHOSCOPY WITH ENDOBRONCHIAL NAVIGATION (N/A ) VIDEO BRONCHOSCOPY WITH ENDOBRONCHIAL ULTRASOUND (N/A )  Patient Location: PACU  Anesthesia Type:General  Level of Consciousness: drowsy  Airway & Oxygen Therapy: Patient Spontanous Breathing and Patient connected to face mask oxygen  Post-op Assessment: Report given to RN and Post -op Vital signs reviewed and stable  Post vital signs: Reviewed and stable  Last Vitals:  Vitals Value Taken Time  BP 112/34   Temp 36 C   Pulse 70 03/04/19 1513  Resp 14 03/04/19 1512  SpO2 100 % 03/04/19 1513  Vitals shown include unvalidated device data.  Last Pain:  Vitals:   03/04/19 1236  TempSrc: Tympanic  PainSc: 0-No pain         Complications: No apparent anesthesia complications

## 2019-03-04 NOTE — Procedures (Signed)
FIBEROPTIC BRONCHOSCOPY WITH BRONCHOALVEOLAR LAVAGE PROCEDURE NOTE  ELECTROMAGNETIC NAVIGATIONAL BRONCHOSCOPY PROCEDURE NOTE  ENDOBRONCHIAL ULTRASOUND PROCEDURE NOTE    Flexible bronchoscopy was performed on 03/04/19 by : Glenda Gins MD  assistance by : Elenora Gamma RT  and 2) [facility] staff 3) Anesthesia team    Indication for the procedure was :  Pre-procedural H&P. The following assessment was performed on the day of the procedure prior to initiating sedation History:  Chest pain n Dyspnea n Hemoptysis n Cough yes Fever n Other pertinent items n  Examination Vital signs -reviewed as per nursing documentation today Cardiac    Murmurs: n  Rubs : n  Gallop: n Lungs Wheezing: n Rales : n Rhonchi n  Other pertinent findings: n   Pre-procedural assessment for Procedural Sedation included: Depth of sedation: As per anesthesia team  ASA Classification:  2 Mallampati airway assessment: 2    Medication list reviewed: y  The patient's interval history was taken and revealed: no new complaints The pre- procedure physical examination revealed: No new findings Refer to prior clinic note for details.  Informed Consent: Informed consent was obtained from:  patient after explanation of procedure and risks, benefits, as well as alternative procedures available.  Explanation of level of sedation and possible transfusion was also provided.    Procedural Preparation: Time out was performed and patient was identified by name and birthdate and procedure to be performed and side for sampling, if any, was specified. Pt was intubated by anesthesia.  The patient was appropriately draped.  Fiberoptic bronchoscopy procedure findings: Bronchoscope was inserted via ETT  without difficulty.  Posterior oropharynx, epiglottis, arytenoids, false cords and vocal cords were not visualized as these were bypassed by endotracheal tube.   The distal trachea was normal in  circumference and appearance without mucosal, cartilaginous or branching abnormalities.  The main carina was non-splayed .  All right and left lobar airways were visualized to the Sub-segmental level.  Sub- sub segmental carinae were identified in all the distal airways.   Secretions were visible in the following airways and appeared to be clear.  The mucosa was : non-friable  Airways were notable for:        exophytic lesions :n       extrinsic compression in the following distributions: n.       Friable mucosa: n       Anthrocotic material /pigmentation: n   Pictorial documentation attached: n      Specimens obtained included:           Cytology brushes : 3x  Broncho-alveolar lavage site:RUL  sent for cytology                             103m volume infused 166mvolume returned with bloody appearance    Electromagnetic navigational bronchoscopy procedure findings After appropriate planning and registration phase LG was localized to target peripheral lesion at the right upper lobe.  Fluoroscopy confirmation performed and initial Cytobrush specimens obtained.  [facility] staff report Cytobrush findings with lesional cells suggestive of non-small cell lung CA.  Next forcep biopsies x5 were obtained at right upper lobe peripheral nodule.  [facility] staff reported lesional atypia suggestive of non-small cell lung CA.  Postprocedure diagnosis: preliminary report of Malignancy  Immediate sampling complications included:0 Epinephrine 36m57mas used topically  The bronchoscopy was terminated due to completion of the planned procedure and the bronchoscope was removed.   Total dosage of Lidocaine  was 49m Total fluoroscopy time was 2.5 minutes   Endobronchial ultrasound procedure findings: Starting from the left side lymph node stations were evaluated under ultrasound guidance. Station 4R was noted to be 0.5 cm and was not biopsied, station 10 L was 0.6 cm and was not biopsied, station 11 L  was 0.4 cm and was not biopsied, station 7 was 0.7 cm and was not biopsied, station 4R was approximately 0.7 cm and was not biopsied, station 10 R was approximately 0.6 cm and was not biopsied.  No abnormally shaped or sized lymph nodes were found under EBUS assisted evaluation.    Estimated Blood loss: 2cc.  Complications included:  none immediate   Preliminary CXR findings :  pending  Disposition: home   Follow up with Dr. ALanney Ginsin 5 days for result discussion.     FClaudette StaplerMD  KCrestviewDivision of Pulmonary & Critical Care Medicine

## 2019-03-04 NOTE — Anesthesia Procedure Notes (Signed)
Procedure Name: Intubation Date/Time: 03/04/2019 1:42 PM Performed by: Lia Foyer, CRNA Pre-anesthesia Checklist: Patient identified, Emergency Drugs available, Suction available and Patient being monitored Patient Re-evaluated:Patient Re-evaluated prior to induction Oxygen Delivery Method: Circle system utilized Preoxygenation: Pre-oxygenation with 100% oxygen Induction Type: IV induction Ventilation: Mask ventilation without difficulty Laryngoscope Size: McGraph and 4 Grade View: Grade II Tube type: Oral Tube size: 8.5 mm Number of attempts: 1 Airway Equipment and Method: Stylet and Oral airway Placement Confirmation: ETT inserted through vocal cords under direct vision,  positive ETCO2 and breath sounds checked- equal and bilateral Secured at: 20 cm Tube secured with: Tape Dental Injury: Teeth and Oropharynx as per pre-operative assessment

## 2019-03-04 NOTE — H&P (Signed)
Pulmonary Medicine          Date: 03/04/2019,   MRN# [id number] Glenda Gomez 1950-07-06     Admission                  Current       CHIEF COMPLAINT:   Lung mass   HISTORY OF PRESENT ILLNESS    [name] is 69 y.o. female who was referred to Pulmonary clinic due to right pulmonary nodule as well as chronic cough. Patient has a background history of essential hypertension hypothyroidism, lumbar radiculitis osteoarthritis with degenerative disc disease, fibromyalgia, morbid obesity, right lung nodule. She had blood work done last month CBC is essentially unremarkable, CMP is normal, low TSH level 4 weeks ago. Patient has a distant history of smoking from age [age] between half to 1 pack/year. She also has a family history of asthma in son and sister and reports hearing intermittent wheezing over the last few months. She had been prescribed trilogy inhaler by primary care doctor however states that she did not need it. Her main complaint today is right upper lobe pulmonary nodule which is 1.2 cm and has been stable over the last 6 months as well as dry cough since September 2019. She is concerned because of significant family history of cancer. She reports that she has an aunt who had passed away from lung cancer which had initially started as breast cancer that was treated. She also currently has another aunt who is alive and is being treated with breast cancer. Her brother had passed away with pancreatic cancer. Her father passed away secondary to complications of hepatocellular carcinoma. She also has a brother who is alive status postlobectomy for non-small cell lung CA. Additionally she has a sister who is currently undergoing breast cancer treatment. Due to these multiple family members who have cancer she is nervous and concerned that she may have an underlying malignancy and wishes to have diagnostic work-up for unresolved right upper lobe nodule. After reviewing recent  PET scan and serial CT chest I explained that there is no hypermetabolic avid lymph nodes including the right upper lobe nodule that has unchanged in size over past 6 months which points away from malignancy. Further patient denies constitutional symptoms although she does report diaphoresis nocturnally.  We had a lengthy discourse regarding the pros and cons of attempting to biopsy this lesion with electro navigational bronchoscopy versus CT chest surveillance. Patient would like to proceed with ENB, she udnerstands the complications of this procedure including , pneumothorax, bleeding, infection, pneumonmendiastinum, may require chest tube or prolonged mechanical ventilation post procedure and very rarely death can occur.     PAST MEDICAL HISTORY   Past Medical History:  Diagnosis Date  . Arthritis   . Chicken pox   . Dyspnea   . Environmental and seasonal allergies   . Fibromyalgia   . GERD (gastroesophageal reflux disease)   . Hemorrhoid   . Hypersomnia   . Hypertension   . Hypothyroidism   . Myositis ossificans   . Osteoarthritis   . Thyroid disease      SURGICAL HISTORY   Past Surgical History:  Procedure Laterality Date  .  bengin tongue lesion     Benign  . ABDOMINAL HYSTERECTOMY    . BACK SURGERY    . bladder tack    . BREAST BIOPSY Left    neg  . CHOLECYSTECTOMY    . COLONOSCOPY WITH PROPOFOL N/A 03/24/2017  Procedure: COLONOSCOPY WITH PROPOFOL;  Surgeon: Lollie Sails, MD;  Location: [facility] ENDOSCOPY;  Service: Endoscopy;  Laterality: N/A;  . ESOPHAGOGASTRODUODENOSCOPY (EGD) WITH PROPOFOL N/A 03/24/2017   Procedure: ESOPHAGOGASTRODUODENOSCOPY (EGD) WITH PROPOFOL;  Surgeon: Lollie Sails, MD;  Location: [facility] ENDOSCOPY;  Service: Endoscopy;  Laterality: N/A;  . GASTRIC BYPASS OPEN    . hemrrhoidectomy    . JOINT REPLACEMENT Bilateral    TKR  . KNEE ARTHROPLASTY Right 07/06/2016   Procedure: COMPUTER ASSISTED TOTAL KNEE ARTHROPLASTY;  Surgeon: Dereck Leep,  MD;  Location: [facility] ORS;  Service: Orthopedics;  Laterality: Right;  . KNEE ARTHROSCOPY    . plantar fascia Left   . SHOULDER ARTHROSCOPY WITH DEBRIDEMENT AND BICEP TENDON REPAIR Left 09/25/2018   Procedure: SHOULDER ARTHROSCOPY WITH DEBRIDEMENT DECOMPRESSION AND BICEP TENDON REPAIR - SCRUB TECH;  Surgeon: Corky Mull, MD;  Location: [facility] ORS;  Service: Orthopedics;  Laterality: Left;  . TONSILLECTOMY       FAMILY HISTORY   Family History  Problem Relation Age of Onset  . Breast cancer Sister 9  . Breast cancer Maternal Aunt 80  . Breast cancer Sister 73  . Alzheimer's disease Mother   . Arthritis Mother   . Diabetes Mother   . Cancer Mother   . Heart failure Brother      SOCIAL HISTORY   Social History   Tobacco Use  . Smoking status: Former Smoker    Packs/day: 2.00    Years: 10.00    Pack years: 20.00    Quit date: 07/17/1993    Years since quitting: 25.6  . Smokeless tobacco: Never Used  Substance Use Topics  . Alcohol use: No  . Drug use: No     MEDICATIONS    Home Medication:    Current Medication: No current facility-administered medications for this encounter.    ALLERGIES   Flagyl [metronidazole] and Pregabalin     REVIEW OF SYSTEMS    Review of Systems:  Gen:  Denies  fever, sweats, chills weigh loss  HEENT: Denies blurred vision, double vision, ear pain, eye pain, hearing loss, nose bleeds, sore throat Cardiac:  No dizziness, chest pain or heaviness, chest tightness,edema Resp:   Denies cough or sputum porduction, shortness of breath,wheezing, hemoptysis,  Gi: Denies swallowing difficulty, stomach pain, nausea or vomiting, diarrhea, constipation, bowel incontinence Gu:  Denies bladder incontinence, burning urine Ext:   Denies Joint pain, stiffness or swelling Skin: Denies  skin rash, easy bruising or bleeding or hives Endoc:  Denies polyuria, polydipsia , polyphagia or weight change Psych:   Denies depression, insomnia or  hallucinations   Other:  All other systems negative   VS: There were no vitals taken for this visit.     PHYSICAL EXAM    GENERAL:NAD, no fevers, chills, no weakness no fatigue HEAD: Normocephalic, atraumatic.  EYES: Pupils equal, round, reactive to light. Extraocular muscles intact. No scleral icterus.  MOUTH: Moist mucosal membrane. Dentition intact. No abscess noted.  EAR, NOSE, THROAT: Clear without exudates. No external lesions.  NECK: Supple. No thyromegaly. No nodules. No JVD.  PULMONARY: Diffuse coarse rhonchi right sided +wheezes CARDIOVASCULAR: S1 and S2. Regular rate and rhythm. No murmurs, rubs, or gallops. No edema. Pedal pulses 2+ bilaterally.  GASTROINTESTINAL: Soft, nontender, nondistended. No masses. Positive bowel sounds. No hepatosplenomegaly.  MUSCULOSKELETAL: No swelling, clubbing, or edema. Range of motion full in all extremities.  NEUROLOGIC: Cranial nerves II through XII are intact. No gross focal neurological deficits. Sensation intact. Reflexes  intact.  SKIN: No ulceration, lesions, rashes, or cyanosis. Skin warm and dry. Turgor intact.  PSYCHIATRIC: Mood, affect within normal limits. The patient is awake, alert and oriented x 3. Insight, judgment intact.       IMAGING      No results found.    ASSESSMENT/PLAN   1.2 cm right upper lobe lung nodule  -Patient with 6 family members who had and passed away or currently still have cancer  -distant hx of smoking  -no constitutional symptoms -Discussed option of biopsy and patient wishes to proceed -. Patient would like to proceed with ENB, she udnerstands the complications of this procedure including , pneumothorax, bleeding, infection, pneumonmendiastinum, may require chest tube or prolonged mechanical ventilation post procedure and very rarely death can occur.      Thank you for allowing me to participate in the care of this patient.    Patient/Family are satisfied with care plan and all  questions have been answered.   This document was prepared using Dragon voice recognition software and may include unintentional dictation errors.     Ottie Glazier, M.D.  Division of [place]

## 2019-03-04 NOTE — Anesthesia Preprocedure Evaluation (Signed)
Anesthesia Evaluation  Patient identified by MRN, date of birth, ID band Patient awake    Reviewed: Allergy & Precautions, NPO status , Patient's Chart, lab work & pertinent test results  History of Anesthesia Complications Negative for: history of anesthetic complications  Airway Mallampati: II  TM Distance: >3 FB Neck ROM: Full    Dental  (+) Missing, Caps   Pulmonary neg sleep apnea, neg COPD, former smoker,    breath sounds clear to auscultation- rhonchi (-) wheezing      Cardiovascular hypertension, Pt. on medications (-) CAD, (-) Past MI, (-) Cardiac Stents and (-) CABG  Rhythm:Regular Rate:Normal - Systolic murmurs and - Diastolic murmurs    Neuro/Psych neg Seizures negative neurological ROS  negative psych ROS   GI/Hepatic Neg liver ROS, GERD  ,  Endo/Other  neg diabetesHypothyroidism   Renal/GU negative Renal ROS     Musculoskeletal  (+) Arthritis , Fibromyalgia -  Abdominal (+) + obese,   Peds  Hematology negative hematology ROS (+)   Anesthesia Other Findings Past Medical History: No date: Arthritis No date: Chicken pox No date: Dyspnea No date: Environmental and seasonal allergies No date: Fibromyalgia No date: GERD (gastroesophageal reflux disease) No date: Hemorrhoid No date: Hypersomnia No date: Hypertension No date: Hypothyroidism 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 No date: Osteoarthritis No date: Thyroid disease   Reproductive/Obstetrics                             Anesthesia Physical Anesthesia Plan  ASA: II  Anesthesia Plan: General   Post-op Pain Management:    Induction: Intravenous  PONV Risk Score and Plan: 2 and Dexamethasone, Ondansetron and Midazolam  Airway Management Planned: Oral ETT  Additional Equipment:   Intra-op Plan:   Post-operative Plan: Extubation in OR  Informed Consent: I have reviewed the patients History and Physical, chart,  labs and discussed the procedure including the risks, benefits and alternatives for the proposed anesthesia with the patient or authorized representative who has indicated his/her understanding and acceptance.     Dental advisory given  Plan Discussed with: CRNA and Anesthesiologist  Anesthesia Plan Comments:         Anesthesia Quick Evaluation

## 2019-03-05 NOTE — Anesthesia Postprocedure Evaluation (Signed)
Anesthesia Post Note  Patient: Aracelly C Fristoe  Procedure(s) Performed: VIDEO BRONCHOSCOPY WITH ENDOBRONCHIAL NAVIGATION (N/A ) VIDEO BRONCHOSCOPY WITH ENDOBRONCHIAL ULTRASOUND (N/A )  Patient location during evaluation: PACU Anesthesia Type: General Level of consciousness: awake and alert and oriented Pain management: pain level controlled Vital Signs Assessment: post-procedure vital signs reviewed and stable Respiratory status: spontaneous breathing, nonlabored ventilation and respiratory function stable Cardiovascular status: blood pressure returned to baseline and stable Postop Assessment: no signs of nausea or vomiting Anesthetic complications: no     Last Vitals:  Vitals:   03/04/19 1653 03/04/19 1708  BP: (!) 134/107 (!) 142/76  Pulse: 76 79  Resp: (!) 22 20  Temp:  (!) 36.4 C  SpO2: 97% 96%    Last Pain:  Vitals:   03/04/19 1708  TempSrc: Temporal  PainSc: 0-No pain                 Kimmy Totten

## 2019-03-06 ENCOUNTER — Other Ambulatory Visit: Payer: Self-pay | Admitting: Internal Medicine

## 2019-03-06 LAB — SURGICAL PATHOLOGY

## 2019-03-06 LAB — CYTOLOGY - NON PAP

## 2019-03-07 ENCOUNTER — Encounter: Payer: Self-pay | Admitting: *Deleted

## 2019-03-07 ENCOUNTER — Other Ambulatory Visit: Payer: Self-pay

## 2019-03-08 ENCOUNTER — Encounter: Payer: Self-pay | Admitting: Internal Medicine

## 2019-03-08 ENCOUNTER — Encounter: Payer: Self-pay | Admitting: *Deleted

## 2019-03-08 ENCOUNTER — Inpatient Hospital Stay: Payer: Medicare HMO | Attending: Internal Medicine | Admitting: Internal Medicine

## 2019-03-08 ENCOUNTER — Other Ambulatory Visit: Payer: Self-pay

## 2019-03-08 DIAGNOSIS — I10 Essential (primary) hypertension: Secondary | ICD-10-CM | POA: Insufficient documentation

## 2019-03-08 DIAGNOSIS — E039 Hypothyroidism, unspecified: Secondary | ICD-10-CM | POA: Insufficient documentation

## 2019-03-08 DIAGNOSIS — E669 Obesity, unspecified: Secondary | ICD-10-CM | POA: Insufficient documentation

## 2019-03-08 DIAGNOSIS — K219 Gastro-esophageal reflux disease without esophagitis: Secondary | ICD-10-CM | POA: Insufficient documentation

## 2019-03-08 DIAGNOSIS — C3411 Malignant neoplasm of upper lobe, right bronchus or lung: Secondary | ICD-10-CM | POA: Insufficient documentation

## 2019-03-08 HISTORY — DX: Malignant neoplasm of upper lobe, right bronchus or lung: C34.11

## 2019-03-08 NOTE — Progress Notes (Signed)
[facility] CONSULT NOTE  Patient Care Team: Tracie Harrier, MD as PCP - General (Internal Medicine) Telford Nab, RN as Registered Nurse  CHIEF COMPLAINTS/PURPOSE OF CONSULTATION: lung cancer  #  Oncology History Overview Note  # JAN 2021- ADENOCA [Dr.A; [place] 2021-]DEC 2020- 13 mm lobular somewhat branching right upper lobe lesion is not hypermetabolic.  # SURVIVORSHIP: P  # GENETICS: NA  DIAGNOSIS:   STAGE:         ;  GOALS:  CURRENT/MOST RECENT THERAPY :     Malignant neoplasm of right upper lobe of lung ([place])  03/08/2019 Initial Diagnosis   Malignant neoplasm of right upper lobe of lung ([facility])      HISTORY OF PRESENTING ILLNESS:  Glenda Gomez 69 y.o.  female history of smoking is here for further evaluation and recommendations for lung cancer.   Patient states that she noted to have chronic cough especially with eating/laughing for the last 2 years.  During the work-up patient was noted to have a lung nodule the upper lobe which was subsequently followed by further imaging.  Most recent CT scan in November 2020 showed slight growth of the right upper lobe lung nodule; PET scan done in December 2020-did not show any significant uptake.  However given the concerns of family history of malignancy-patient went on to have a bronchoscopy/biopsy; pathology as above.  Patient had extensive GI work-up including EGD/barium studies-for her cough; rule out aspiration.  Patient states her cough is stable; not resolved.   Review of Systems  Constitutional: Negative for chills, diaphoresis, fever, malaise/fatigue and weight loss.  HENT: Negative for nosebleeds and sore throat.   Eyes: Negative for double vision.  Respiratory: Positive for cough. Negative for hemoptysis, sputum production, shortness of breath and wheezing.   Cardiovascular: Negative for chest pain, palpitations, orthopnea and leg swelling.  Gastrointestinal: Negative for abdominal pain, blood in  stool, constipation, diarrhea, heartburn, melena, nausea and vomiting.  Genitourinary: Negative for dysuria, frequency and urgency.  Musculoskeletal: Positive for joint pain. Negative for back pain.  Skin: Negative.  Negative for itching and rash.  Neurological: Negative for dizziness, tingling, focal weakness, weakness and headaches.  Endo/Heme/Allergies: Does not bruise/bleed easily.  Psychiatric/Behavioral: Negative for depression. The patient is not nervous/anxious and does not have insomnia.      MEDICAL HISTORY:  Past Medical History:  Diagnosis Date  . Arthritis   . Chicken pox   . Dyspnea   . Environmental and seasonal allergies   . Fibromyalgia   . GERD (gastroesophageal reflux disease)   . Hemorrhoid   . Hypersomnia   . Hypertension   . Hypothyroidism   . Lung cancer ([place])   . Malignant neoplasm of right upper lobe of lung ([place]) 03/08/2019  . Myositis ossificans   . Osteoarthritis   . Thyroid disease     SURGICAL HISTORY: Past Surgical History:  Procedure Laterality Date  .  bengin tongue lesion     Benign  . ABDOMINAL HYSTERECTOMY    . BACK SURGERY    . bladder tack    . BREAST BIOPSY Left    neg  . CHOLECYSTECTOMY    . COLONOSCOPY WITH PROPOFOL N/A 03/24/2017   Procedure: COLONOSCOPY WITH PROPOFOL;  Surgeon: Lollie Sails, MD;  Location: [facility] ENDOSCOPY;  Service: Endoscopy;  Laterality: N/A;  . ESOPHAGOGASTRODUODENOSCOPY (EGD) WITH PROPOFOL N/A 03/24/2017   Procedure: ESOPHAGOGASTRODUODENOSCOPY (EGD) WITH PROPOFOL;  Surgeon: Lollie Sails, MD;  Location: [facility] ENDOSCOPY;  Service: Endoscopy;  Laterality: N/A;  . GASTRIC BYPASS OPEN    . hemrrhoidectomy    . JOINT REPLACEMENT Bilateral    TKR  . KNEE ARTHROPLASTY Right 07/06/2016   Procedure: COMPUTER ASSISTED TOTAL KNEE ARTHROPLASTY;  Surgeon: Dereck Leep, MD;  Location: [facility] ORS;  Service: Orthopedics;  Laterality: Right;  . KNEE ARTHROSCOPY    . plantar fascia Left   . SHOULDER ARTHROSCOPY WITH  DEBRIDEMENT AND BICEP TENDON REPAIR Left 09/25/2018   Procedure: SHOULDER ARTHROSCOPY WITH DEBRIDEMENT DECOMPRESSION AND BICEP TENDON REPAIR - SCRUB TECH;  Surgeon: Corky Mull, MD;  Location: [facility] ORS;  Service: Orthopedics;  Laterality: Left;  . TONSILLECTOMY    . VIDEO BRONCHOSCOPY WITH ENDOBRONCHIAL NAVIGATION N/A 03/04/2019   Procedure: VIDEO BRONCHOSCOPY WITH ENDOBRONCHIAL NAVIGATION;  Surgeon: Ottie Glazier, MD;  Location: [facility] ORS;  Service: Thoracic;  Laterality: N/A;  . VIDEO BRONCHOSCOPY WITH ENDOBRONCHIAL ULTRASOUND N/A 03/04/2019   Procedure: VIDEO BRONCHOSCOPY WITH ENDOBRONCHIAL ULTRASOUND;  Surgeon: Ottie Glazier, MD;  Location: [facility] ORS;  Service: Thoracic;  Laterality: N/A;    SOCIAL HISTORY: Social History   Socioeconomic History  . Marital status: Divorced    Spouse name: Not on file  . Number of children: Not on file  . Years of education: Not on file  . Highest education level: Not on file  Occupational History  . Not on file  Tobacco Use  . Smoking status: Former Smoker    Packs/day: 2.00    Years: 10.00    Pack years: 20.00    Quit date: 07/17/1993    Years since quitting: 25.6  . Smokeless tobacco: Never Used  Substance and Sexual Activity  . Alcohol use: No  . Drug use: No  . Sexual activity: Not on file  Other Topics Concern  . Not on file  Social History Narrative   1ppd- quit > 20 years ago; no alcohol; in [place]; lives self. retd- cook.    Social Determinants of Health   Financial Resource Strain:   . Difficulty of Paying Living Expenses: Not on file  Food Insecurity:   . Worried About Charity fundraiser in the Last Year: Not on file  . Ran Out of Food in the Last Year: Not on file  Transportation Needs:   . Lack of Transportation (Medical): Not on file  . Lack of Transportation (Non-Medical): Not on file  Physical Activity:   . Days of Exercise per Week: Not on file  . Minutes of Exercise per Session: Not on file  Stress:   . Feeling  of Stress : Not on file  Social Connections:   . Frequency of Communication with Friends and Family: Not on file  . Frequency of Social Gatherings with Friends and Family: Not on file  . Attends Religious Services: Not on file  . Active Member of Clubs or Organizations: Not on file  . Attends Archivist Meetings: Not on file  . Marital Status: Not on file  Intimate Partner Violence:   . Fear of Current or Ex-Partner: Not on file  . Emotionally Abused: Not on file  . Physically Abused: Not on file  . Sexually Abused: Not on file    FAMILY HISTORY: Family History  Problem Relation Age of Onset  . Breast cancer Sister 28  . Breast cancer Maternal Aunt 80  . Breast cancer Sister 51  . Alzheimer's disease Mother   . Arthritis Mother   . Diabetes Mother   . Heart failure Brother   .  Pancreatic cancer Brother 73  . Liver cancer Father     ALLERGIES:  is allergic to flagyl [metronidazole] and pregabalin.  MEDICATIONS:  Current Outpatient Medications  Medication Sig Dispense Refill  . celecoxib (CELEBREX) 200 MG capsule Take 1 capsule (200 mg total) by mouth 2 (two) times daily with a meal. (Patient taking differently: Take 200-400 mg by mouth daily. ) 60 capsule 1  . estradiol (ESTRACE) 0.5 MG tablet Take 0.5 mg by mouth daily.    Marland Kitchen levothyroxine (SYNTHROID) 200 MCG tablet Take 200 mcg by mouth daily before breakfast.     . omeprazole (PRILOSEC) 20 MG capsule Take 20 mg by mouth 2 (two) times daily before a meal.     . traMADol (ULTRAM) 50 MG tablet Take 50 mg by mouth 2 (two) times daily as needed.    . traZODone (DESYREL) 50 MG tablet Take 50 mg by mouth at bedtime.    . triamterene-hydrochlorothiazide (MAXZIDE-25) 37.5-25 MG tablet Take 1 tablet by mouth daily.    . VENTOLIN HFA 108 (90 Base) MCG/ACT inhaler Inhale 2 puffs into the lungs every 6 (six) hours as needed for wheezing or shortness of breath.     . cyclobenzaprine (FLEXERIL) 10 MG tablet Take 10 mg by mouth 3  (three) times daily as needed for muscle spasms.     . fluticasone (FLONASE) 50 MCG/ACT nasal spray Place 2 sprays into both nostrils daily as needed for allergies or rhinitis.     . furosemide (LASIX) 20 MG tablet Take 20 mg by mouth daily as needed for fluid or edema.     Marland Kitchen oxyCODONE (OXY IR/ROXICODONE) 5 MG immediate release tablet Take 1-2 tablets (5-10 mg total) by mouth every 4 (four) hours as needed for severe pain or breakthrough pain. (Patient not taking: Reported on 01/31/2019) 50 tablet 0  . Polyethyl Glycol-Propyl Glycol (SYSTANE OP) Place 1 drop into both eyes daily as needed (dry eyes).    Marland Kitchen zolpidem (AMBIEN) 5 MG tablet Take 5-7.5 mg by mouth at bedtime.      No current facility-administered medications for this visit.      Marland Kitchen  PHYSICAL EXAMINATION: ECOG PERFORMANCE STATUS: 0 - Asymptomatic  Vitals:   03/07/19 1500  BP: 132/80  Pulse: (!) 59  Temp: 98.3 F (36.8 C)  SpO2: 96%   Filed Weights   03/07/19 1500  Weight: 238 lb (108 kg)    Physical Exam  Constitutional: She is oriented to person, place, and time and well-developed, well-nourished, and in no distress.  Obese.  Accompanied by son.  She is walking herself.  HENT:  Head: Normocephalic and atraumatic.  Mouth/Throat: Oropharynx is clear and moist. No oropharyngeal exudate.  Eyes: Pupils are equal, round, and reactive to light.  Cardiovascular: Normal rate and regular rhythm.  Pulmonary/Chest: Effort normal and breath sounds normal. No respiratory distress. She has no wheezes.  Abdominal: Soft. Bowel sounds are normal. She exhibits no distension and no mass. There is no abdominal tenderness. There is no rebound and no guarding.  Musculoskeletal:        General: No tenderness or edema. Normal range of motion.     Cervical back: Normal range of motion and neck supple.  Neurological: She is alert and oriented to person, place, and time.  Skin: Skin is warm.  Psychiatric: Affect normal.   LABORATORY DATA:   I have reviewed the data as listed Lab Results  Component Value Date   WBC 9.8 02/13/2019   HGB 14.3 02/13/2019  HCT 43.0 02/13/2019   MCV 79.2 (L) 02/13/2019   PLT 310 02/13/2019   Recent Labs    01/25/19 0902  BUN 15  CREATININE 0.76  GFRNONAA >60  GFRAA >60    RADIOGRAPHIC STUDIES: I have personally reviewed the radiological images as listed and agreed with the findings in the report. DG C-Arm 1-60 Min-No Report  Result Date: 03/04/2019 Fluoroscopy was utilized by the requesting physician.  No radiographic interpretation.   CT Super D Chest Wo Contrast  Result Date: 03/04/2019 CLINICAL DATA:  Preop bronchoscopy. EXAM: CT CHEST WITHOUT CONTRAST TECHNIQUE: Multidetector CT imaging of the chest was performed using thin slice collimation for electromagnetic bronchoscopy planning purposes, without intravenous contrast. COMPARISON:  01/25/2019. FINDINGS: Cardiovascular: Atherosclerotic calcification of the aorta and coronary arteries. Pulmonic trunk is enlarged. Heart size normal. No pericardial effusion. Mediastinum/Nodes: No pathologically enlarged mediastinal or axillary lymph nodes. Hilar regions are difficult to evaluate without IV contrast. There may be distal esophageal wall thickening which can be seen with gastroesophageal reflux. Lungs/Pleura: Mild centrilobular emphysema. Spiculated nodule in the peripheral right upper lobe measures 11 mm (10 x 12 mm, 3/22), as on the prior exam. Lungs are otherwise clear. No pleural fluid. Airway is unremarkable. Upper Abdomen: Visualized portions of the liver, adrenal glands, kidneys, spleen and pancreas are unremarkable. Postoperative changes in the stomach. No upper abdominal adenopathy. Musculoskeletal: Mild degenerative changes in the spine. IMPRESSION: 1. 11 mm right upper lobe nodule, worrisome for primary bronchogenic carcinoma. This study was performed for preoperative planning. 2. Aortic atherosclerosis (ICD10-I70.0). Coronary artery  calcification. 3. Enlarged pulmonic trunk, indicative of pulmonary arterial hypertension. 4.  Emphysema (ICD10-J43.9). Electronically Signed   By: Lorin Picket M.D.   On: 03/04/2019 13:45    ASSESSMENT & PLAN:   Malignant neoplasm of right upper lobe of lung ([place]) # RIGHT UPPER LOBE LUNG CA-clinical stage I adenocarcinoma/non-small cell lung cancer.  I reviewed the staging pathology with the patient and family in detail-however they understand this is preliminary-as she is currently awaiting surgical options.  #Also discussed that if post surgery/pathology is truly stage I-she will most likely not benefit from adjuvant chemotherapy or radiation.  However await final surgical pathology to confirm the above recommendations.  PFTs- "good" per patient unavailable to me.  #However if for any reason the patient is not a candidate for surgery-would recommend radiation/SBRT.  We will make a referral to Dr. Faith Rogue for surgical evaluation/further recommendations.  #Chronic cough-question reflux.  S/p extensive GI evaluation.   #Obesity-discussed that patient will benefit from weight loss/eating healthy and exercise-would not discuss down the risk of cancer/but also risk of other diseases like cardiovascular disease.   # Thank you Dr.Aleskerov for allowing me to participate in the care of your pleasant patient. Please do not hesitate to contact me with questions or concerns in the interim.  Discussed with [facility].  # I reviewed the blood work- with the patient in detail; also reviewed the imaging independently [as summarized above]; and with the patient in detail.   # DISPOSITION: # referral to Dr.Oaks # follow up TBD-Dr.B  Dr.A/Hande.   All questions were answered. The patient knows to call the clinic with any problems, questions or concerns.    Cammie Sickle, MD 03/08/2019 12:47 PM

## 2019-03-08 NOTE — Assessment & Plan Note (Addendum)
#   RIGHT UPPER LOBE LUNG CA-clinical stage I adenocarcinoma/non-small cell lung cancer.  I reviewed the staging pathology with the patient and family in detail-however they understand this is preliminary-as she is currently awaiting surgical options.  #Also discussed that if post surgery/pathology is truly stage I-she will most likely not benefit from adjuvant chemotherapy or radiation.  However await final surgical pathology to confirm the above recommendations.  PFTs- "good" per patient unavailable to me.  #However if for any reason the patient is not a candidate for surgery-would recommend radiation/SBRT.  We will make a referral to Dr. Faith Rogue for surgical evaluation/further recommendations.  #Chronic cough-question reflux.  S/p extensive GI evaluation.   #Obesity-discussed that patient will benefit from weight loss/eating healthy and exercise-would not discuss down the risk of cancer/but also risk of other diseases like cardiovascular disease.   # Thank you Dr.Aleskerov for allowing me to participate in the care of your pleasant patient. Please do not hesitate to contact me with questions or concerns in the interim.  Discussed with [facility].  # I reviewed the blood work- with the patient in detail; also reviewed the imaging independently [as summarized above]; and with the patient in detail.   # DISPOSITION: # referral to Dr.Oaks # follow up TBD-Dr.B  Dr.A/Hande.

## 2019-03-08 NOTE — Progress Notes (Signed)
  Oncology Nurse Navigator Documentation  Navigator Location: [facility]-Med Onc (03/08/19 1300) Referral Date to RadOnc/MedOnc: 03/06/19 (03/08/19 1300) )Navigator Encounter Type: Initial MedOnc (03/08/19 1300)   Abnormal Finding Date: 01/25/19 (03/08/19 1300) Confirmed Diagnosis Date: 03/06/19 (03/08/19 1300)                 Treatment Phase: Pre-Tx/Tx Discussion (03/08/19 1300) Barriers/Navigation Needs: Coordination of Care (03/08/19 1300)   Interventions: Coordination of Care;Referrals (03/08/19 1300)   Coordination of Care: Appts (03/08/19 1300)        Acuity: Level 1-No Barriers (03/08/19 1300)  met with patient during initial consult with Dr. Rogue Bussing to discuss treatment options. All questions answered during visit. Pt given resources regarding diagnosis and supportive services available. Reviewed upcoming appts. Contact info given and instructed to call with any further questions or needs. Pt verbalized understanding. Nothing further needed at this time.        Time Spent with Patient: 60 (03/08/19 1300)

## 2019-03-14 ENCOUNTER — Other Ambulatory Visit: Payer: Medicare HMO

## 2019-03-14 NOTE — Progress Notes (Signed)
Tumor Board Documentation  Glenda Gomez was presented by Dr Rogue Bussing at our Tumor Board on 03/14/2019, which included representatives from medical oncology, radiation oncology, navigation, pathology, radiology, surgical, surgical oncology, internal medicine, genetics, pulmonology, palliative care, research.  Glenda Gomez currently presents as a new patient, for [place], for new positive pathology with history of the following treatments: active survellience, surgical intervention(s).  Additionally, we reviewed previous medical and familial history, history of present illness, and recent lab results along with all available histopathologic and imaging studies. The tumor board considered available treatment options and made the following recommendations: Surgery    The following procedures/referrals were also placed: No orders of the defined types were placed in this encounter.   Clinical Trial Status: not discussed   Staging used: Pathologic Stage  AJCC Staging: T: 1 N: 0 M: 0 Group: Stage 1 Adenocarcinoma of Lung   National site-specific guidelines NCCN were discussed with respect to the case.  Tumor board is a meeting of clinicians from various specialty areas who evaluate and discuss patients for whom a multidisciplinary approach is being considered. Final determinations in the plan of care are those of the provider(s). The responsibility for follow up of recommendations given during tumor board is that of the provider.   Today's extended care, comprehensive team conference, Glenda Gomez was not present for the discussion and was not examined.   Multidisciplinary Tumor Board is a multidisciplinary case peer review process.  Decisions discussed in the Multidisciplinary Tumor Board reflect the opinions of the specialists present at the conference without having examined the patient.  Ultimately, treatment and diagnostic decisions rest with the primary provider(s) and the patient.

## 2019-03-15 ENCOUNTER — Ambulatory Visit (INDEPENDENT_AMBULATORY_CARE_PROVIDER_SITE_OTHER): Payer: Medicare HMO | Admitting: Cardiothoracic Surgery

## 2019-03-15 ENCOUNTER — Encounter: Payer: Self-pay | Admitting: Cardiothoracic Surgery

## 2019-03-15 ENCOUNTER — Other Ambulatory Visit: Payer: Self-pay

## 2019-03-15 VITALS — BP 182/80 | HR 69 | Temp 97.5°F | Resp 14 | Ht 62.0 in | Wt 246.2 lb

## 2019-03-15 DIAGNOSIS — C3490 Malignant neoplasm of unspecified part of unspecified bronchus or lung: Secondary | ICD-10-CM | POA: Diagnosis not present

## 2019-03-15 DIAGNOSIS — C3491 Malignant neoplasm of unspecified part of right bronchus or lung: Secondary | ICD-10-CM

## 2019-03-15 NOTE — Patient Instructions (Addendum)
We have scheduled you for a PET Scan from your Head to your Thighs.  This has been scheduled on Wednesday February 3rd at 9:00 am, scan time is 9:30 am. Please arrive at the [place] entrance of [facility] at 9:00 am with a list of your medications, a photo ID, and insurance card.  Please have nothing to eat or drink after midnight prior to your PET Scan.   If you are diabetic, skip your morning dose of medication.  If you need to reschedule your Scan, you may do so by calling [phone].  Call us after your PET scan and Dr Genevive Bi will go over your results for you and then you can talk to him about what you would like to do.        Lung Resection A lung resection is a procedure to remove part or all of a lung. When an entire lung is removed, the procedure is called a pneumonectomy. When only part of a lung is removed, the procedure is called a lobectomy. A lung resection is typically done to get rid of a tumor or cancer, but it may be done to treat other conditions. This procedure can help relieve some or all of your symptoms and can also help keep the problem from getting worse. Lung resection may provide the best chance for curing your disease. However, the procedure may not necessarily cure lung cancer if that is the problem. Tell a health care provider about:  Any allergies you have.  All medicines you are taking, including vitamins, herbs, eye drops, creams, and over-the-counter medicines.  Any problems you or family members have had with anesthetic medicines.  Any blood disorders you have.  Any surgeries you have had.  Any medical conditions you have. What are the risks? Generally, lung resection is a safe procedure. However, problems can occur and include:  Excessive bleeding.  Infection.  Inability to breathe without a ventilator.  Persistent shortness of breath.  Heart problems, including abnormal rhythms and a risk of heart attack or heart failure.  Blood  clots.  Injury to a blood vessel.  Injury to a nerve.  Failure to heal properly.  Stroke.  Bronchopleural fistula. This is a small hole between one of the main breathing tubes (bronchus) and the lining of the lungs. This is rare.  Reaction to anesthesia.  What happens before the procedure? You may have tests done before the procedure, including:  Blood tests.  Urine tests.  X-rays.  Other imaging tests (such as CT scans, MRI scans, and PET scans). These tests are done to find the exact size and location of the condition being treated with this surgery.  Pulmonary function tests. These are breathing tests to assess the function of your lungs before surgery and to decide how to best help your breathing after surgery.  Heart testing. This is done to make sure your heart is strong enough for the procedure.  Bronchoscopy. This is a technique that allows your health care provider to look at the inside of your airways. This is done using a soft, flexible tube (bronchoscope). Along with imaging tests, this can help your health care provider know the exact location and size of the area that will be removed during surgery.  Lymph node sampling. This may need to be done to see if the tumor has spread. It may be done as a separate surgery or right before your lung resection procedure.  What happens during the procedure?  An IV tube  will be placed in your arm. You will be given a medicine that makes you fall asleep (general anesthetic). You may also get pain medicine through a thin, flexible tube (catheter) in your back.  A breathing tube will be placed in your throat.  Once the surgical team has prepared you for surgery, your surgeon will make an incision on your side. Some resections are done through large incisions, while others can be done through small incisions using smaller instruments and assisted with small cameras (laparoscopic surgery).  Your surgeon will carefully cut the  veins, arteries, and bronchus leading to your lung. After being cut, each of these pieces will be sewn or stapled closed. The lung or part of the lung will then be removed.  Your surgeon will check inside your chest to make sure there is no bleeding in or around the lungs. Lymph nodes near the lung may also be removed for later tests.  Your surgeon may put tubes into your chest to drain extra fluid and air after surgery.  Your incision will be closed. This may be done using: ? Stitches that absorb into your body and do not need to be removed. ? Stitches that must be removed. ? Staples that must be removed. What happens after the procedure?  You will be taken to the recovery area and your progress will be monitored. You may still have a breathing tube and other tubes or catheters in your body immediately after surgery. These will be removed during your recovery. You may be put on a respirator following surgery if some assistance is needed to help your breathing. When you are awake and not experiencing immediate problems from surgery, you will be moved to the intensive care unit (ICU) where you will continue your recovery.  You may feel pain in your chest and throat. Sometimes during recovery, patients may shiver or feel nauseous. You will be given medicine to help with pain and nausea.  The breathing tube will be taken out as soon as your health care providers feel you can breathe on your own. For most people, this happens on the same day as the surgery.  If your surgery and time in the ICU go well, most of the tubes and equipment will be taken out within 1-2 days after surgery. This is about how long most people stay in the ICU. You may need to stay longer, depending on how you are doing.  You should also start respiratory therapy in the ICU. This therapy uses breathing exercises to help your other lung stay healthy and get stronger.  As you improve, you will be moved to a regular hospital room  for continued respiratory therapy, help with your bladder and bowels, and to continue medicines.  After your lung or part of your lung is taken out, there will be a space inside your chest. This space will often fill up with fluid over time. The amount of time this takes is different for each person.  You will receive care until you are doing well and your health care provider feels it is safe for you to go home or to transfer to an extended care facility. This information is not intended to replace advice given to you by your health care provider. Make sure you discuss any questions you have with your health care provider. Document Released: 04/23/2002 Document Revised: 07/09/2015 Document Reviewed: 03/22/2013 Elsevier Interactive Patient Education  2018 [employer].

## 2019-03-15 NOTE — Progress Notes (Signed)
Patient ID: Glenda Gomez, female   DOB: 26-May-1950, 69 y.o.   MRN: [id number]  Chief Complaint  Patient presents with  . New Patient (Initial Visit)    Lung Cancer    Referred By Dr. 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  Glenda Gomez is a 69 y.o. female.  Her problems began about a year ago when she first developed a cough which she thought was related to an upper respiratory tract infection.  She had a chest x-ray made that was abnormal and this was followed by a CT scan and PET scan in December 2019 the CT scan revealed a 1 cm right upper lobe nodule.  This was negative by PET.  She elected to be followed and ultimately over the course of the year the right upper lobe lesion increased slightly in size and she ultimately underwent navigational bronchoscopy and biopsy which revealed an adenocarcinoma of the lung.  She did have a complete set of pulmonary function studies done which revealed an FEV1 of 102% of predicted and a DLCO of 82% predicted.  She has not had a PET scan yet.  She was seen by Dr. Lynett Fish and was subsequently referred to me for surgical consultation.  The patient is somewhat short of breath with exercise and walking.  She attributes this to her arthritis as well as her obesity.  She smoked for about 5 years 1 pack of cigarettes a day but quit 25 years ago.  She has no known asbestos or radon exposure.   Past Medical History:  Diagnosis Date  . Arthritis   . Chicken pox   . Dyspnea   . Environmental and seasonal allergies   . Fibromyalgia   . GERD (gastroesophageal reflux disease)   . Hemorrhoid   . Hypersomnia   . Hypertension   . Hypothyroidism   . Lung cancer (Glenda Gomez)   . Malignant neoplasm of right upper lobe of lung ([place]) 03/08/2019  . Myositis ossificans   . Osteoarthritis   . Thyroid disease     Past Surgical History:   Procedure Laterality Date  .  bengin tongue lesion     Benign  . ABDOMINAL HYSTERECTOMY    . BACK SURGERY    . bladder tack    . BREAST BIOPSY Left    neg  . CHOLECYSTECTOMY    . COLONOSCOPY WITH PROPOFOL N/A 03/24/2017   Procedure: COLONOSCOPY WITH PROPOFOL;  Surgeon: Lollie Sails, MD;  Location: [facility] ENDOSCOPY;  Service: Endoscopy;  Laterality: N/A;  . ESOPHAGOGASTRODUODENOSCOPY (EGD) WITH PROPOFOL N/A 03/24/2017   Procedure: ESOPHAGOGASTRODUODENOSCOPY (EGD) WITH PROPOFOL;  Surgeon: Lollie Sails, MD;  Location: [facility] ENDOSCOPY;  Service: Endoscopy;  Laterality: N/A;  . GASTRIC BYPASS OPEN    . hemrrhoidectomy    . JOINT REPLACEMENT Bilateral    TKR  . KNEE ARTHROPLASTY Right 07/06/2016   Procedure: COMPUTER ASSISTED TOTAL KNEE ARTHROPLASTY;  Surgeon: Dereck Leep, MD;  Location: [facility] ORS;  Service: Orthopedics;  Laterality: Right;  . KNEE ARTHROSCOPY    . plantar fascia Left   . SHOULDER ARTHROSCOPY WITH DEBRIDEMENT AND BICEP TENDON REPAIR Left 09/25/2018   Procedure: SHOULDER ARTHROSCOPY WITH DEBRIDEMENT DECOMPRESSION AND BICEP TENDON REPAIR - SCRUB TECH;  Surgeon: Corky Mull, MD;  Location: [facility] ORS;  Service: Orthopedics;  Laterality: Left;  . TONSILLECTOMY    . VIDEO BRONCHOSCOPY WITH ENDOBRONCHIAL NAVIGATION N/A 03/04/2019  Procedure: VIDEO BRONCHOSCOPY WITH ENDOBRONCHIAL NAVIGATION;  Surgeon: Ottie Glazier, MD;  Location: [facility] ORS;  Service: Thoracic;  Laterality: N/A;  . VIDEO BRONCHOSCOPY WITH ENDOBRONCHIAL ULTRASOUND N/A 03/04/2019   Procedure: VIDEO BRONCHOSCOPY WITH ENDOBRONCHIAL ULTRASOUND;  Surgeon: Ottie Glazier, MD;  Location: [facility] ORS;  Service: Thoracic;  Laterality: N/A;    Family History  Problem Relation Age of Onset  . Breast cancer Sister 41  . Breast cancer Maternal Aunt 80  . Lung cancer Maternal Aunt   . Breast cancer Sister 26  . Alzheimer's disease Mother   . Arthritis Mother   . Diabetes Mother   . Heart failure Brother   .  Pancreatic cancer Brother 32  . Lung cancer Brother   . Liver cancer Father     Social History Social History   Tobacco Use  . Smoking status: Former Smoker    Packs/day: 2.00    Years: 10.00    Pack years: 20.00    Quit date: 07/17/1993    Years since quitting: 25.6  . Smokeless tobacco: Never Used  Substance Use Topics  . Alcohol use: No  . Drug use: No    Allergies  Allergen Reactions  . Flagyl [Metronidazole] Diarrhea and Nausea And Vomiting    PASS OUT  . Pregabalin Other (See Comments)    "dizziness"    Current Outpatient Medications  Medication Sig Dispense Refill  . celecoxib (CELEBREX) 200 MG capsule Take 1 capsule (200 mg total) by mouth 2 (two) times daily with a meal. (Patient taking differently: Take 200-400 mg by mouth daily. ) 60 capsule 1  . cyclobenzaprine (FLEXERIL) 10 MG tablet Take 10 mg by mouth 3 (three) times daily as needed for muscle spasms.     Marland Kitchen estradiol (ESTRACE) 0.5 MG tablet Take 0.5 mg by mouth daily.    . fluticasone (FLONASE) 50 MCG/ACT nasal spray Place 2 sprays into both nostrils daily as needed for allergies or rhinitis.     . furosemide (LASIX) 20 MG tablet Take 20 mg by mouth daily as needed for fluid or edema.     Marland Kitchen levothyroxine (SYNTHROID) 200 MCG tablet Take 200 mcg by mouth daily before breakfast.     . omeprazole (PRILOSEC) 20 MG capsule Take 20 mg by mouth 2 (two) times daily before a meal.     . traMADol (ULTRAM) 50 MG tablet Take 50 mg by mouth 2 (two) times daily as needed.    . traZODone (DESYREL) 50 MG tablet Take 50 mg by mouth at bedtime.    . triamterene-hydrochlorothiazide (MAXZIDE-25) 37.5-25 MG tablet Take 1 tablet by mouth daily.    . VENTOLIN HFA 108 (90 Base) MCG/ACT inhaler Inhale 2 puffs into the lungs every 6 (six) hours as needed for wheezing or shortness of breath.     . zolpidem (AMBIEN) 5 MG tablet Take 5-7.5 mg by mouth at bedtime.      No current facility-administered medications for this visit.       Review of Systems A complete review of systems was asked and was negative except for the following positive findings reflux, cough, joint pain, arthritis, shortness of breath with exercise  Blood pressure (!) 182/80, pulse 69, temperature (!) 97.5 F (36.4 C), resp. rate 14, height 5\' 2"  (1.575 m), weight 246 lb 3.2 oz (111.7 kg), SpO2 96 %.  Physical Exam CONSTITUTIONAL:  Pleasant, well-developed, well-nourished, and in no acute distress. EYES: Pupils equal and reactive to light, Sclera non-icteric EARS, NOSE, MOUTH AND THROAT:  The oropharynx was clear.  Dentition is good repair.  Oral mucosa pink and moist. LYMPH NODES:  Lymph nodes in the neck and axillae were normal RESPIRATORY:  Lungs were clear.  Normal respiratory effort without pathologic use of accessory muscles of respiration CARDIOVASCULAR: Heart was regular without murmurs.  There were no carotid bruits. GI: The abdomen was soft, nontender, and nondistended. There were no palpable masses. There was no hepatosplenomegaly. There were normal bowel sounds in all quadrants. GU:  Rectal deferred.   MUSCULOSKELETAL:  Normal muscle strength and tone.  No clubbing or cyanosis.   SKIN:  There were no pathologic skin lesions.  There were no nodules on palpation. NEUROLOGIC:  Sensation is normal.  Cranial nerves are grossly intact. PSYCH:  Oriented to person, place and time.  Mood and affect are normal.  Data Reviewed   I have personally reviewed the patient's imaging, laboratory findings and medical records.    Assessment    Right upper lobe mass biopsy-proven adenocarcinoma    Plan    I explained to the patient the indications and risks of surgical intervention.  I think that a PET scan should be performed first although I am confident that it will unlikely show metastatic disease.  I reviewed with her the indications and risks of surgery and compared to the alternatives of therapy.  I explained to her that surgical  intervention has a higher cure rate.  Also explained to her that because of my current situation at [facility] that we would be unable to provide that surgical service to her here but I can make a referral to [facility]  She is going to have her PET scan done next week and will contact me as to how she would like to proceed.       Nestor Lewandowsky, MD 03/15/2019, 9:43 AM

## 2019-03-18 ENCOUNTER — Telehealth: Payer: Self-pay | Admitting: *Deleted

## 2019-03-18 NOTE — Telephone Encounter (Signed)
Error

## 2019-03-20 ENCOUNTER — Ambulatory Visit: Payer: Medicare HMO

## 2019-03-20 ENCOUNTER — Other Ambulatory Visit: Payer: Self-pay

## 2019-03-20 ENCOUNTER — Ambulatory Visit
Admission: RE | Admit: 2019-03-20 | Discharge: 2019-03-20 | Disposition: A | Discharge status: Home / Self Care | Payer: Medicare HMO | Source: Ambulatory Visit | Attending: Cardiothoracic Surgery | Admitting: Cardiothoracic Surgery

## 2019-03-20 DIAGNOSIS — I2510 Atherosclerotic heart disease of native coronary artery without angina pectoris: Secondary | ICD-10-CM | POA: Diagnosis not present

## 2019-03-20 DIAGNOSIS — C3491 Malignant neoplasm of unspecified part of right bronchus or lung: Secondary | ICD-10-CM

## 2019-03-20 DIAGNOSIS — C3490 Malignant neoplasm of unspecified part of unspecified bronchus or lung: Secondary | ICD-10-CM

## 2019-03-20 DIAGNOSIS — C3411 Malignant neoplasm of upper lobe, right bronchus or lung: Secondary | ICD-10-CM | POA: Insufficient documentation

## 2019-03-20 LAB — GLUCOSE, CAPILLARY: Glucose-Capillary: 101 mg/dL — ABNORMAL HIGH (ref 70–99)

## 2019-03-20 MED ORDER — FLUDEOXYGLUCOSE F - 18 (FDG) INJECTION
12.7000 | Freq: Once | INTRAVENOUS | Status: AC | PRN
  Administered 2019-03-20: 09:00:00 13.09 via INTRAVENOUS

## 2019-03-26 ENCOUNTER — Encounter: Payer: Self-pay | Admitting: Thoracic Surgery (Cardiothoracic Vascular Surgery)

## 2019-03-26 ENCOUNTER — Other Ambulatory Visit: Payer: Self-pay | Admitting: *Deleted

## 2019-03-26 ENCOUNTER — Other Ambulatory Visit: Payer: Self-pay

## 2019-03-26 ENCOUNTER — Institutional Professional Consult (permissible substitution) (INDEPENDENT_AMBULATORY_CARE_PROVIDER_SITE_OTHER): Payer: Medicare HMO | Admitting: Thoracic Surgery (Cardiothoracic Vascular Surgery)

## 2019-03-26 ENCOUNTER — Ambulatory Visit: Payer: Medicare HMO

## 2019-03-26 VITALS — BP 188/91 | HR 58 | Temp 97.0°F | Resp 16 | Ht 62.0 in | Wt 250.0 lb

## 2019-03-26 DIAGNOSIS — C3411 Malignant neoplasm of upper lobe, right bronchus or lung: Secondary | ICD-10-CM | POA: Diagnosis not present

## 2019-03-26 NOTE — Progress Notes (Signed)
PCP is Tracie Harrier, MD Referring Provider is Nestor Lewandowsky, MD  Chief Complaint  Patient presents with  . Lung Cancer    RULobe.Marland KitchenMarland KitchenSurgical eval, PET Scan 03/20/19, Chest CT 03/04/19, ENB 03/04/19    HPI: Glenda Gomez is sent for consultation regarding an adenocarcinoma of the right upper lobe.  Glenda Gomez is a 69 year old woman with a history of obesity, status post gastric bypass, fibromyalgia, reflux, hypertension, hypothyroidism, arthritis, myositis ossificans, and an adenocarcinoma of the right upper lobe.  Glenda Gomez does have a history of tobacco abuse but only smoked for about 10 years prior to quitting in 1995.  Glenda Gomez was first found to have a lung nodule about a year ago.  Glenda Gomez presented with a cough and shortness of breath.  Glenda Gomez had a "15 x 10 mm" right upper lobe nodule which was new from a study in 2010.  A PET showed an SUV of 1.77 which was less than the background mediastinal activity.  Glenda Gomez elected to follow the nodule.  The nodule was stable on a CT in June, but on a CT in December the nodule was larger.  It measured 1.3 x 0.9 x 1.2 cm compared to 0.9 x 0.7 x 1.0 cm a year prior.  A repeat PET/CT showed an SUV of 2.6.  There also was some activity in a right hilar node.  Glenda Gomez had navigational bronchoscopy by Dr. Lanney Gins on 03/04/2019.  The nodule turned out to be an adenocarcinoma.  Glenda Gomez was referred to Dr. Genevive Bi.  Unfortunately, he had a hand injury and now Glenda Gomez is referred here for consideration for surgical resection.  Glenda Gomez gets short of breath with exertion.  Glenda Gomez is very limited in her exertion due to pain from the arthritis and myositis ossificans.  Glenda Gomez has not had any significant weight loss over the past 3 months.  Glenda Gomez denies wheezing or shortness of breath at rest. Zubrod Score: At the time of surgery this patient's most appropriate activity status/level should be described as: []     0    Normal activity, no symptoms [x]     1    Restricted in physical strenuous activity but ambulatory,  able to do out light work []     2    Ambulatory and capable of self care, unable to do work activities, up and about >50 % of waking hours                              []     3    Only limited self care, in bed greater than 50% of waking hours []     4    Completely disabled, no self care, confined to bed or chair []     5    Moribund  Past Medical History:  Diagnosis Date  . Arthritis   . Chicken pox   . Dyspnea   . Environmental and seasonal allergies   . Fibromyalgia   . GERD (gastroesophageal reflux disease)   . Hemorrhoid   . Hypersomnia   . Hypertension   . Hypothyroidism   . Lung cancer ([place])   . Malignant neoplasm of right upper lobe of lung ([place]) 03/08/2019  . Myositis ossificans   . Osteoarthritis   . Thyroid disease     Past Surgical History:  Procedure Laterality Date  .  bengin tongue lesion     Benign  . ABDOMINAL HYSTERECTOMY    . BACK SURGERY    .  bladder tack    . BREAST BIOPSY Left    neg  . CHOLECYSTECTOMY    . COLONOSCOPY WITH PROPOFOL N/A 03/24/2017   Procedure: COLONOSCOPY WITH PROPOFOL;  Surgeon: Lollie Sails, MD;  Location: [facility] ENDOSCOPY;  Service: Endoscopy;  Laterality: N/A;  . ESOPHAGOGASTRODUODENOSCOPY (EGD) WITH PROPOFOL N/A 03/24/2017   Procedure: ESOPHAGOGASTRODUODENOSCOPY (EGD) WITH PROPOFOL;  Surgeon: Lollie Sails, MD;  Location: [facility]COPY;  Service: Endoscopy;  Laterality: N/A;  . GASTRIC BYPASS OPEN    . hemrrhoidectomy    . JOINT REPLACEMENT Bilateral    TKR  . KNEE ARTHROPLASTY Right 07/06/2016   Procedure: COMPUTER ASSISTED TOTAL KNEE ARTHROPLASTY;  Surgeon: Dereck Leep, MD;  Location: [facility] ORS;  Service: Orthopedics;  Laterality: Right;  . KNEE ARTHROSCOPY    . plantar fascia Left   . SHOULDER ARTHROSCOPY WITH DEBRIDEMENT AND BICEP TENDON REPAIR Left 09/25/2018   Procedure: SHOULDER ARTHROSCOPY WITH DEBRIDEMENT DECOMPRESSION AND BICEP TENDON REPAIR - SCRUB TECH;  Surgeon: Corky Mull, MD;  Location: [facility] ORS;   Service: Orthopedics;  Laterality: Left;  . TONSILLECTOMY    . VIDEO BRONCHOSCOPY WITH ENDOBRONCHIAL NAVIGATION N/A 03/04/2019   Procedure: VIDEO BRONCHOSCOPY WITH ENDOBRONCHIAL NAVIGATION;  Surgeon: Ottie Glazier, MD;  Location: [facility] ORS;  Service: Thoracic;  Laterality: N/A;  . VIDEO BRONCHOSCOPY WITH ENDOBRONCHIAL ULTRASOUND N/A 03/04/2019   Procedure: VIDEO BRONCHOSCOPY WITH ENDOBRONCHIAL ULTRASOUND;  Surgeon: Ottie Glazier, MD;  Location: [facility] ORS;  Service: Thoracic;  Laterality: N/A;    Family History  Problem Relation Age of Onset  . Breast cancer Sister 104  . Breast cancer Maternal Aunt 80  . Lung cancer Maternal Aunt   . Breast cancer Sister 43  . Alzheimer's disease Mother   . Arthritis Mother   . Diabetes Mother   . Heart failure Brother   . Pancreatic cancer Brother 69  . Lung cancer Brother   . Liver cancer Father     Social History Social History   Tobacco Use  . Smoking status: Former Smoker    Packs/day: 2.00    Years: 10.00    Pack years: 20.00    Quit date: 07/17/1993    Years since quitting: 25.7  . Smokeless tobacco: Never Used  Substance Use Topics  . Alcohol use: No  . Drug use: No    Current Outpatient Medications  Medication Sig Dispense Refill  . celecoxib (CELEBREX) 200 MG capsule Take 1 capsule (200 mg total) by mouth 2 (two) times daily with a meal. (Patient taking differently: Take 200-400 mg by mouth daily. ) 60 capsule 1  . cyclobenzaprine (FLEXERIL) 10 MG tablet Take 10 mg by mouth 3 (three) times daily as needed for muscle spasms.     Marland Kitchen estradiol (ESTRACE) 0.5 MG tablet Take 0.5 mg by mouth daily.    . fluticasone (FLONASE) 50 MCG/ACT nasal spray Place 2 sprays into both nostrils daily as needed for allergies or rhinitis.     . furosemide (LASIX) 20 MG tablet Take 20 mg by mouth daily as needed for fluid or edema.     Marland Kitchen levothyroxine (SYNTHROID) 200 MCG tablet Take 200 mcg by mouth daily before breakfast.     . omeprazole (PRILOSEC) 20  MG capsule Take 20 mg by mouth 2 (two) times daily before a meal.     . traMADol (ULTRAM) 50 MG tablet Take 50 mg by mouth 2 (two) times daily as needed.    . traZODone (DESYREL) 50 MG tablet Take 50 mg by  mouth at bedtime.    . triamterene-hydrochlorothiazide (MAXZIDE-25) 37.5-25 MG tablet Take 1 tablet by mouth daily.    . VENTOLIN HFA 108 (90 Base) MCG/ACT inhaler Inhale 2 puffs into the lungs every 6 (six) hours as needed for wheezing or shortness of breath.     . zolpidem (AMBIEN) 5 MG tablet Take 5-7.5 mg by mouth at bedtime.      No current facility-administered medications for this visit.    Allergies  Allergen Reactions  . Flagyl [Metronidazole] Diarrhea and Nausea And Vomiting    PASS OUT  . Pregabalin Other (See Comments)    "dizziness"    Review of Systems  Constitutional: Positive for fatigue. Negative for activity change, appetite change and unexpected weight change.  HENT: Negative for trouble swallowing and voice change.   Eyes: Negative for visual disturbance.  Respiratory: Positive for cough and shortness of breath. Negative for wheezing.   Cardiovascular: Positive for leg swelling (Left greater than right). Negative for chest pain and palpitations.  Gastrointestinal:       Reflux  Genitourinary: Negative for difficulty urinating and dysuria.  Musculoskeletal: Positive for arthralgias, joint swelling and myalgias.  Neurological: Negative for seizures, syncope and weakness.  Hematological: Negative for adenopathy. Does not bruise/bleed easily.  All other systems reviewed and are negative.   BP (!) 188/91 (BP Location: Left Arm, Patient Position: Sitting, Cuff Size: Large)   Pulse (!) 58   Temp (!) 97 F (36.1 C)   Resp 16   Ht 5\' 2"  (1.575 m)   Wt 250 lb (113.4 kg)   SpO2 98% Comment: ON RA  BMI 45.73 kg/m  Physical Exam Constitutional:      General: Glenda Gomez is not in acute distress.    Appearance: Glenda Gomez is obese.  HENT:     Head: Normocephalic and  atraumatic.  Eyes:     General: No scleral icterus.    Extraocular Movements: Extraocular movements intact.  Cardiovascular:     Rate and Rhythm: Normal rate and regular rhythm.     Heart sounds: Normal heart sounds. No murmur. No friction rub. No gallop.   Pulmonary:     Effort: Pulmonary effort is normal. No respiratory distress.     Breath sounds: Normal breath sounds. No wheezing or rales.  Abdominal:     General: There is no distension.     Palpations: Abdomen is soft.  Musculoskeletal:        General: No swelling.     Cervical back: Neck supple.  Skin:    General: Skin is warm and dry.  Neurological:     General: No focal deficit present.     Mental Status: Glenda Gomez is alert and oriented to person, place, and time.     Cranial Nerves: No cranial nerve deficit.     Motor: No weakness.    Diagnostic Tests: CT CHEST WITHOUT CONTRAST  TECHNIQUE: Multidetector CT imaging of the chest was performed using thin slice collimation for electromagnetic bronchoscopy planning purposes, without intravenous contrast.  COMPARISON:  01/25/2019.  FINDINGS: Cardiovascular: Atherosclerotic calcification of the aorta and coronary arteries. Pulmonic trunk is enlarged. Heart size normal. No pericardial effusion.  Mediastinum/Nodes: No pathologically enlarged mediastinal or axillary lymph nodes. Hilar regions are difficult to evaluate without IV contrast. There may be distal esophageal wall thickening which can be seen with gastroesophageal reflux.  Lungs/Pleura: Mild centrilobular emphysema. Spiculated nodule in the peripheral right upper lobe measures 11 mm (10 x 12 mm, 3/22), as on the prior exam.  Lungs are otherwise clear. No pleural fluid. Airway is unremarkable.  Upper Abdomen: Visualized portions of the liver, adrenal glands, kidneys, spleen and pancreas are unremarkable. Postoperative changes in the stomach. No upper abdominal adenopathy.  Musculoskeletal: Mild  degenerative changes in the spine.  IMPRESSION: 1. 11 mm right upper lobe nodule, worrisome for primary bronchogenic carcinoma. This study was performed for preoperative planning. 2. Aortic atherosclerosis (ICD10-I70.0). Coronary artery calcification. 3. Enlarged pulmonic trunk, indicative of pulmonary arterial hypertension. 4.  Emphysema (ICD10-J43.9).   Electronically Signed   By: Lorin Picket M.D.   On: 03/04/2019 13:45 NUCLEAR MEDICINE PET SKULL BASE TO THIGH  TECHNIQUE: 13.1 mCi F-18 FDG was injected intravenously. Full-ring PET imaging was performed from the skull base to thigh after the radiotracer. CT data was obtained and used for attenuation correction and anatomic localization.  Fasting blood glucose: 101 mg/dl  COMPARISON:  Multiple exams, including CT chest 03/04/2019 and PET-CT from 02/02/2018  FINDINGS: Mediastinal blood pool activity: SUV max No significant abnormal hypermetabolic activity in this region.  Liver activity: SUV max NA  NECK: No significant abnormal hypermetabolic activity in this region.  Incidental CT findings: Bilateral common carotid atherosclerotic calcification.  CHEST: Since 03/04/2019, there has been significant medial expansion of the right upper lobe nodule, with a oval-shaped expansion extending towards the hilum and associated bronchus. In total the nodule measures 2.4 by 1.8 cm on image 89/3 although much of this represents the new expansion. Centered over the original portion of the nodule, maximum SUV is 2.6, previously 1.8. A small right hilar node has maximum SUV of 3.8.  No new nodule is identified.  Incidental CT findings: Mild cardiomegaly. Coronary, aortic arch, and branch vessel atherosclerotic vascular disease.  ABDOMEN/PELVIS: No significant abnormal hypermetabolic activity in this region.  Incidental CT findings: Aortoiliac atherosclerotic vascular disease.  SKELETON: No significant  abnormal hypermetabolic activity in this region.  Incidental CT findings: none  IMPRESSION: 1. The right upper lobe nodule has a maximum SUV of 2.6, formerly 1.8. Currently this is considered somewhat worrisome for low-grade malignancy. On the other hand, there is been significant interval expansion medially from this nodule compared to 03/04/2019, favoring mucous plugging in the subtending bronchus given the rapid progression. Such plugging could certainly occur in the setting of a tumor, or could be inflammatory. If percutaneous or bronchoscopic tissue sampling is performed, I would recommend targeting the more lateral portion of this process which corresponds to the original nodule. 2. Subtly accentuated small right hilar lymph node, maximum SUV 3.8, nonspecific. 3. Other imaging findings of potential clinical significance: Aortic Atherosclerosis (ICD10-I70.0). Coronary atherosclerosis. Mild cardiomegaly.   Electronically Signed   By: Van Clines M.D.   On: 03/20/2019 16:45 I personally reviewed the CT and PET/CT images and concur with the findings noted above  Pulmonary function testing 03/01/2019 FVC 2.78 (105%) FEV1 2.16 (102%) TLC 103% RV 103% DLCO 20.4 (82%)  Impression: Glenda Gomez is a 69 year old woman  with a history of obesity, status post gastric bypass, fibromyalgia, reflux, hypertension, hypothyroidism, arthritis, myositis ossificans, and an adenocarcinoma of the right upper lobe.  Glenda Gomez was first found to have a lung nodule back in December 2019.  It was only mildly active on PET/CT.  Over time the nodule increased in size and activity.  Navigational bronchoscopy and biopsy showed adenocarcinoma.  Glenda Gomez has a clinical T1, N0, stage Ia lesion.  We discussed treatment options including surgical resection and stereotactic radiation.  I informed her and her husband of the  advantages and disadvantages of each approach.  PFT showed adequate pulmonary reserve to  tolerate a lobectomy.  I discussed the general nature of the surgery with her and her husband, including the need for general anesthesia, the incisions to be used, the use of a drainage tube postoperatively, the expected hospital stay, and the overall recovery.  I informed them of the indications, risk, benefits, and alternatives.  They understand the risks include, but not limited to death, MI, DVT, PE, bleeding, possible need for transfusion, infection, prolonged air leak, cardiac arrhythmias, as well as the possibility of other unforeseeable complications.  Glenda Gomez understands and accepts the risk of surgery and wishes to proceed to soon as possible.  Plan: Robotic right upper lobectomy on Friday, 03/29/2019  [name], MD [facility] [phone]

## 2019-03-26 NOTE — H&P (View-Only) (Signed)
PCP is Tracie Harrier, MD Referring Provider is Nestor Lewandowsky, MD  Chief Complaint  Patient presents with  . Lung Cancer    RULobe.Marland KitchenMarland KitchenSurgical eval, PET Scan 03/20/19, Chest CT 03/04/19, ENB 03/04/19    HPI: Glenda Gomez is sent for consultation regarding an adenocarcinoma of the right upper lobe.  Glenda Gomez is a 69 year old woman with a history of obesity, status post gastric bypass, fibromyalgia, reflux, hypertension, hypothyroidism, arthritis, myositis ossificans, and an adenocarcinoma of the right upper lobe.  She does have a history of tobacco abuse but only smoked for about 10 years prior to quitting in 1995.  She was first found to have a lung nodule about a year ago.  She presented with a cough and shortness of breath.  She had a "15 x 10 mm" right upper lobe nodule which was new from a study in 2010.  A PET showed an SUV of 1.77 which was less than the background mediastinal activity.  She elected to follow the nodule.  The nodule was stable on a CT in June, but on a CT in December the nodule was larger.  It measured 1.3 x 0.9 x 1.2 cm compared to 0.9 x 0.7 x 1.0 cm a year prior.  A repeat PET/CT showed an SUV of 2.6.  There also was some activity in a right hilar node.  She had navigational bronchoscopy by Dr. Lanney Gins on 03/04/2019.  The nodule turned out to be an adenocarcinoma.  She was referred to Dr. Genevive Bi.  Unfortunately, he had a hand injury and now she is referred here for consideration for surgical resection.  She gets short of breath with exertion.  She is very limited in her exertion due to pain from the arthritis and myositis ossificans.  She has not had any significant weight loss over the past 3 months.  She denies wheezing or shortness of breath at rest. Zubrod Score: At the time of surgery this patient's most appropriate activity status/level should be described as: []     0    Normal activity, no symptoms [x]     1    Restricted in physical strenuous activity but ambulatory,  able to do out light work []     2    Ambulatory and capable of self care, unable to do work activities, up and about >50 % of waking hours                              []     3    Only limited self care, in bed greater than 50% of waking hours []     4    Completely disabled, no self care, confined to bed or chair []     5    Moribund  Past Medical History:  Diagnosis Date  . Arthritis   . Chicken pox   . Dyspnea   . Environmental and seasonal allergies   . Fibromyalgia   . GERD (gastroesophageal reflux disease)   . Hemorrhoid   . Hypersomnia   . Hypertension   . Hypothyroidism   . Lung cancer ([place])   . Malignant neoplasm of right upper lobe of lung ([place]) 03/08/2019  . Myositis ossificans   . Osteoarthritis   . Thyroid disease     Past Surgical History:  Procedure Laterality Date  .  bengin tongue lesion     Benign  . ABDOMINAL HYSTERECTOMY    . BACK SURGERY    .  bladder tack    . BREAST BIOPSY Left    neg  . CHOLECYSTECTOMY    . COLONOSCOPY WITH PROPOFOL N/A 03/24/2017   Procedure: COLONOSCOPY WITH PROPOFOL;  Surgeon: Lollie Sails, MD;  Location: [facility] ENDOSCOPY;  Service: Endoscopy;  Laterality: N/A;  . ESOPHAGOGASTRODUODENOSCOPY (EGD) WITH PROPOFOL N/A 03/24/2017   Procedure: ESOPHAGOGASTRODUODENOSCOPY (EGD) WITH PROPOFOL;  Surgeon: Lollie Sails, MD;  Location: [facility] ENDOSCOPY;  Service: Endoscopy;  Laterality: N/A;  . GASTRIC BYPASS OPEN    . hemrrhoidectomy    . JOINT REPLACEMENT Bilateral    TKR  . KNEE ARTHROPLASTY Right 07/06/2016   Procedure: COMPUTER ASSISTED TOTAL KNEE ARTHROPLASTY;  Surgeon: Dereck Leep, MD;  Location: [facility] ORS;  Service: Orthopedics;  Laterality: Right;  . KNEE ARTHROSCOPY    . plantar fascia Left   . SHOULDER ARTHROSCOPY WITH DEBRIDEMENT AND BICEP TENDON REPAIR Left 09/25/2018   Procedure: SHOULDER ARTHROSCOPY WITH DEBRIDEMENT DECOMPRESSION AND BICEP TENDON REPAIR - SCRUB TECH;  Surgeon: Corky Mull, MD;  Location: [facility] ORS;   Service: Orthopedics;  Laterality: Left;  . TONSILLECTOMY    . VIDEO BRONCHOSCOPY WITH ENDOBRONCHIAL NAVIGATION N/A 03/04/2019   Procedure: VIDEO BRONCHOSCOPY WITH ENDOBRONCHIAL NAVIGATION;  Surgeon: Ottie Glazier, MD;  Location: [facility] ORS;  Service: Thoracic;  Laterality: N/A;  . VIDEO BRONCHOSCOPY WITH ENDOBRONCHIAL ULTRASOUND N/A 03/04/2019   Procedure: VIDEO BRONCHOSCOPY WITH ENDOBRONCHIAL ULTRASOUND;  Surgeon: Ottie Glazier, MD;  Location: [facility] ORS;  Service: Thoracic;  Laterality: N/A;    Family History  Problem Relation Age of Onset  . Breast cancer Sister 63  . Breast cancer Maternal Aunt 80  . Lung cancer Maternal Aunt   . Breast cancer Sister 34  . Alzheimer's disease Mother   . Arthritis Mother   . Diabetes Mother   . Heart failure Brother   . Pancreatic cancer Brother 23  . Lung cancer Brother   . Liver cancer Father     Social History Social History   Tobacco Use  . Smoking status: Former Smoker    Packs/day: 2.00    Years: 10.00    Pack years: 20.00    Quit date: 07/17/1993    Years since quitting: 25.7  . Smokeless tobacco: Never Used  Substance Use Topics  . Alcohol use: No  . Drug use: No    Current Outpatient Medications  Medication Sig Dispense Refill  . celecoxib (CELEBREX) 200 MG capsule Take 1 capsule (200 mg total) by mouth 2 (two) times daily with a meal. (Patient taking differently: Take 200-400 mg by mouth daily. ) 60 capsule 1  . cyclobenzaprine (FLEXERIL) 10 MG tablet Take 10 mg by mouth 3 (three) times daily as needed for muscle spasms.     Marland Kitchen estradiol (ESTRACE) 0.5 MG tablet Take 0.5 mg by mouth daily.    . fluticasone (FLONASE) 50 MCG/ACT nasal spray Place 2 sprays into both nostrils daily as needed for allergies or rhinitis.     . furosemide (LASIX) 20 MG tablet Take 20 mg by mouth daily as needed for fluid or edema.     Marland Kitchen levothyroxine (SYNTHROID) 200 MCG tablet Take 200 mcg by mouth daily before breakfast.     . omeprazole (PRILOSEC) 20  MG capsule Take 20 mg by mouth 2 (two) times daily before a meal.     . traMADol (ULTRAM) 50 MG tablet Take 50 mg by mouth 2 (two) times daily as needed.    . traZODone (DESYREL) 50 MG tablet Take 50 mg by  mouth at bedtime.    . triamterene-hydrochlorothiazide (MAXZIDE-25) 37.5-25 MG tablet Take 1 tablet by mouth daily.    . VENTOLIN HFA 108 (90 Base) MCG/ACT inhaler Inhale 2 puffs into the lungs every 6 (six) hours as needed for wheezing or shortness of breath.     . zolpidem (AMBIEN) 5 MG tablet Take 5-7.5 mg by mouth at bedtime.      No current facility-administered medications for this visit.    Allergies  Allergen Reactions  . Flagyl [Metronidazole] Diarrhea and Nausea And Vomiting    PASS OUT  . Pregabalin Other (See Comments)    "dizziness"    Review of Systems  Constitutional: Positive for fatigue. Negative for activity change, appetite change and unexpected weight change.  HENT: Negative for trouble swallowing and voice change.   Eyes: Negative for visual disturbance.  Respiratory: Positive for cough and shortness of breath. Negative for wheezing.   Cardiovascular: Positive for leg swelling (Left greater than right). Negative for chest pain and palpitations.  Gastrointestinal:       Reflux  Genitourinary: Negative for difficulty urinating and dysuria.  Musculoskeletal: Positive for arthralgias, joint swelling and myalgias.  Neurological: Negative for seizures, syncope and weakness.  Hematological: Negative for adenopathy. Does not bruise/bleed easily.  All other systems reviewed and are negative.   BP (!) 188/91 (BP Location: Left Arm, Patient Position: Sitting, Cuff Size: Large)   Pulse (!) 58   Temp (!) 97 F (36.1 C)   Resp 16   Ht 5\' 2"  (1.575 m)   Wt 250 lb (113.4 kg)   SpO2 98% Comment: ON RA  BMI 45.73 kg/m  Physical Exam Constitutional:      General: She is not in acute distress.    Appearance: She is obese.  HENT:     Head: Normocephalic and  atraumatic.  Eyes:     General: No scleral icterus.    Extraocular Movements: Extraocular movements intact.  Cardiovascular:     Rate and Rhythm: Normal rate and regular rhythm.     Heart sounds: Normal heart sounds. No murmur. No friction rub. No gallop.   Pulmonary:     Effort: Pulmonary effort is normal. No respiratory distress.     Breath sounds: Normal breath sounds. No wheezing or rales.  Abdominal:     General: There is no distension.     Palpations: Abdomen is soft.  Musculoskeletal:        General: No swelling.     Cervical back: Neck supple.  Skin:    General: Skin is warm and dry.  Neurological:     General: No focal deficit present.     Mental Status: She is alert and oriented to person, place, and time.     Cranial Nerves: No cranial nerve deficit.     Motor: No weakness.    Diagnostic Tests: CT CHEST WITHOUT CONTRAST  TECHNIQUE: Multidetector CT imaging of the chest was performed using thin slice collimation for electromagnetic bronchoscopy planning purposes, without intravenous contrast.  COMPARISON:  01/25/2019.  FINDINGS: Cardiovascular: Atherosclerotic calcification of the aorta and coronary arteries. Pulmonic trunk is enlarged. Heart size normal. No pericardial effusion.  Mediastinum/Nodes: No pathologically enlarged mediastinal or axillary lymph nodes. Hilar regions are difficult to evaluate without IV contrast. There may be distal esophageal wall thickening which can be seen with gastroesophageal reflux.  Lungs/Pleura: Mild centrilobular emphysema. Spiculated nodule in the peripheral right upper lobe measures 11 mm (10 x 12 mm, 3/22), as on the prior exam.  Lungs are otherwise clear. No pleural fluid. Airway is unremarkable.  Upper Abdomen: Visualized portions of the liver, adrenal glands, kidneys, spleen and pancreas are unremarkable. Postoperative changes in the stomach. No upper abdominal adenopathy.  Musculoskeletal: Mild  degenerative changes in the spine.  IMPRESSION: 1. 11 mm right upper lobe nodule, worrisome for primary bronchogenic carcinoma. This study was performed for preoperative planning. 2. Aortic atherosclerosis (ICD10-I70.0). Coronary artery calcification. 3. Enlarged pulmonic trunk, indicative of pulmonary arterial hypertension. 4.  Emphysema (ICD10-J43.9).   Electronically Signed   By: Lorin Picket M.D.   On: 03/04/2019 13:45 NUCLEAR MEDICINE PET SKULL BASE TO THIGH  TECHNIQUE: 13.1 mCi F-18 FDG was injected intravenously. Full-ring PET imaging was performed from the skull base to thigh after the radiotracer. CT data was obtained and used for attenuation correction and anatomic localization.  Fasting blood glucose: 101 mg/dl  COMPARISON:  Multiple exams, including CT chest 03/04/2019 and PET-CT from 02/02/2018  FINDINGS: Mediastinal blood pool activity: SUV max No significant abnormal hypermetabolic activity in this region.  Liver activity: SUV max NA  NECK: No significant abnormal hypermetabolic activity in this region.  Incidental CT findings: Bilateral common carotid atherosclerotic calcification.  CHEST: Since 03/04/2019, there has been significant medial expansion of the right upper lobe nodule, with a oval-shaped expansion extending towards the hilum and associated bronchus. In total the nodule measures 2.4 by 1.8 cm on image 89/3 although much of this represents the new expansion. Centered over the original portion of the nodule, maximum SUV is 2.6, previously 1.8. A small right hilar node has maximum SUV of 3.8.  No new nodule is identified.  Incidental CT findings: Mild cardiomegaly. Coronary, aortic arch, and branch vessel atherosclerotic vascular disease.  ABDOMEN/PELVIS: No significant abnormal hypermetabolic activity in this region.  Incidental CT findings: Aortoiliac atherosclerotic vascular disease.  SKELETON: No significant  abnormal hypermetabolic activity in this region.  Incidental CT findings: none  IMPRESSION: 1. The right upper lobe nodule has a maximum SUV of 2.6, formerly 1.8. Currently this is considered somewhat worrisome for low-grade malignancy. On the other hand, there is been significant interval expansion medially from this nodule compared to 03/04/2019, favoring mucous plugging in the subtending bronchus given the rapid progression. Such plugging could certainly occur in the setting of a tumor, or could be inflammatory. If percutaneous or bronchoscopic tissue sampling is performed, I would recommend targeting the more lateral portion of this process which corresponds to the original nodule. 2. Subtly accentuated small right hilar lymph node, maximum SUV 3.8, nonspecific. 3. Other imaging findings of potential clinical significance: Aortic Atherosclerosis (ICD10-I70.0). Coronary atherosclerosis. Mild cardiomegaly.   Electronically Signed   By: Van Clines M.D.   On: 03/20/2019 16:45 I personally reviewed the CT and PET/CT images and concur with the findings noted above  Pulmonary function testing 03/01/2019 FVC 2.78 (105%) FEV1 2.16 (102%) TLC 103% RV 103% DLCO 20.4 (82%)  Impression: Glenda Gomez is a 69 year old woman  with a history of obesity, status post gastric bypass, fibromyalgia, reflux, hypertension, hypothyroidism, arthritis, myositis ossificans, and an adenocarcinoma of the right upper lobe.  She was first found to have a lung nodule back in December 2019.  It was only mildly active on PET/CT.  Over time the nodule increased in size and activity.  Navigational bronchoscopy and biopsy showed adenocarcinoma.  She has a clinical T1, N0, stage Ia lesion.  We discussed treatment options including surgical resection and stereotactic radiation.  I informed her and her husband of the  advantages and disadvantages of each approach.  PFT showed adequate pulmonary reserve to  tolerate a lobectomy.  I discussed the general nature of the surgery with her and her husband, including the need for general anesthesia, the incisions to be used, the use of a drainage tube postoperatively, the expected hospital stay, and the overall recovery.  I informed them of the indications, risk, benefits, and alternatives.  They understand the risks include, but not limited to death, MI, DVT, PE, bleeding, possible need for transfusion, infection, prolonged air leak, cardiac arrhythmias, as well as the possibility of other unforeseeable complications.  She understands and accepts the risk of surgery and wishes to proceed to soon as possible.  Plan: Robotic right upper lobectomy on Friday, 03/29/2019  [name], MD [facility] [phone]

## 2019-03-26 NOTE — Pre-Procedure Instructions (Signed)
[facility], [address] AT [address] [place] [facility] [place] [address] Phone: [phone] Fax: [phone]      Your procedure is scheduled on Friday, February 12th.  Report to [facility] Main Entrance "A" at 5:30 A.M., and check in at the Admitting office.  Call this number if you have problems the morning of surgery:  [phone]  Call [phone] if you have any questions prior to your surgery date Monday-Friday 8am-4pm    Remember:  Do not eat or drink after midnight the night before your surgery    Take these medicines the morning of surgery with A SIP OF WATER  levothyroxine (SYNTHROID) omeprazole (PRILOSEC)  traMADol (ULTRAM)-as needed cyclobenzaprine (FLEXERIL)-as needed fluticasone (FLONASE) -as needed Inhaler-please bring inhaler with you to the hospital.   As of today, STOP taking any Aspirin (unless otherwise instructed by your surgeon), Aleve, Naproxen, Ibuprofen, Motrin, Advil, Goody's, BC's, all herbal medications, fish oil, and all vitamins. Including: celecoxib (CELEBREX).    The Morning of Surgery  Do not wear jewelry, make-up or nail polish.  Do not wear lotions, powders, or perfumes, or deodorant  Do not shave 48 hours prior to surgery.    Do not bring valuables to the hospital.  [facility] is not responsible for any belongings or valuables.  If you are a smoker, DO NOT Smoke 24 hours prior to surgery  If you wear a CPAP at night please bring your mask the morning of surgery   Remember that you must have someone to transport you home after your surgery, and remain with you for 24 hours if you are discharged the same day.   Please bring cases for contacts, glasses, hearing aids, dentures or bridgework because it cannot be worn into surgery.    Leave your suitcase in the car.  After surgery it may be brought to your room.  For patients admitted to the hospital, discharge time will  be determined by your treatment team.  Patients discharged the day of surgery will not be allowed to drive home.    Special instructions:   [place]- Preparing For Surgery  Before surgery, you can play an important role. Because skin is not sterile, your skin needs to be as free of germs as possible. You can reduce the number of germs on your skin by washing with CHG (chlorahexidine gluconate) Soap before surgery.  CHG is an antiseptic cleaner which kills germs and bonds with the skin to continue killing germs even after washing.    Oral Hygiene is also important to reduce your risk of infection.  Remember - BRUSH YOUR TEETH THE MORNING OF SURGERY WITH YOUR REGULAR TOOTHPASTE  Please do not use if you have an allergy to CHG or antibacterial soaps. If your skin becomes reddened/irritated stop using the CHG.  Do not shave (including legs and underarms) for at least 48 hours prior to first CHG shower. It is OK to shave your face.  Please follow these instructions carefully.   1. Shower the NIGHT BEFORE SURGERY and the MORNING OF SURGERY with CHG Soap.   2. If you chose to wash your hair, wash your hair first as usual with your normal shampoo.  3. After you shampoo, rinse your hair and body thoroughly to remove the shampoo.  4. Use CHG as you would any other liquid soap. You can apply CHG directly to the skin and wash gently with  a scrungie or a clean washcloth.   5. Apply the CHG Soap to your body ONLY FROM THE NECK DOWN.  Do not use on open wounds or open sores. Avoid contact with your eyes, ears, mouth and genitals (private parts). Wash Face and genitals (private parts)  with your normal soap.   6. Wash thoroughly, paying special attention to the area where your surgery will be performed.  7. Thoroughly rinse your body with warm water from the neck down.  8. DO NOT shower/wash with your normal soap after using and rinsing off the CHG Soap.  9. Pat yourself dry with a CLEAN  TOWEL.  10. Wear CLEAN PAJAMAS to bed the night before surgery, wear comfortable clothes the morning of surgery  11. Place CLEAN SHEETS on your bed the night of your first shower and DO NOT SLEEP WITH PETS.    Day of Surgery:  Please shower the morning of surgery with the CHG soap Do not apply any deodorants/lotions. Please wear clean clothes to the hospital/surgery center.   Remember to brush your teeth WITH YOUR REGULAR TOOTHPASTE.   Please read over the following fact sheets that you were given.

## 2019-03-27 ENCOUNTER — Encounter (HOSPITAL_COMMUNITY): Payer: Self-pay

## 2019-03-27 ENCOUNTER — Other Ambulatory Visit: Payer: Self-pay

## 2019-03-27 ENCOUNTER — Other Ambulatory Visit (HOSPITAL_COMMUNITY)
Admission: RE | Admit: 2019-03-27 | Discharge: 2019-03-27 | Disposition: A | Discharge status: Home / Self Care | Payer: Medicare HMO | Source: Ambulatory Visit | Attending: Thoracic Surgery (Cardiothoracic Vascular Surgery) | Admitting: Thoracic Surgery (Cardiothoracic Vascular Surgery)

## 2019-03-27 ENCOUNTER — Ambulatory Visit (HOSPITAL_COMMUNITY)
Admission: RE | Admit: 2019-03-27 | Discharge: 2019-03-27 | Disposition: A | Discharge status: Home / Self Care | Payer: Medicare HMO | Source: Ambulatory Visit | Attending: Thoracic Surgery (Cardiothoracic Vascular Surgery) | Admitting: Thoracic Surgery (Cardiothoracic Vascular Surgery)

## 2019-03-27 ENCOUNTER — Encounter (HOSPITAL_COMMUNITY)
Admission: RE | Admit: 2019-03-27 | Discharge: 2019-03-27 | Disposition: A | Discharge status: Home / Self Care | Payer: Medicare HMO | Source: Ambulatory Visit | Attending: Thoracic Surgery (Cardiothoracic Vascular Surgery) | Admitting: Thoracic Surgery (Cardiothoracic Vascular Surgery)

## 2019-03-27 DIAGNOSIS — C3411 Malignant neoplasm of upper lobe, right bronchus or lung: Secondary | ICD-10-CM

## 2019-03-27 DIAGNOSIS — Z01818 Encounter for other preprocedural examination: Secondary | ICD-10-CM | POA: Insufficient documentation

## 2019-03-27 DIAGNOSIS — J984 Other disorders of lung: Secondary | ICD-10-CM | POA: Insufficient documentation

## 2019-03-27 DIAGNOSIS — Z87891 Personal history of nicotine dependence: Secondary | ICD-10-CM | POA: Insufficient documentation

## 2019-03-27 DIAGNOSIS — R001 Bradycardia, unspecified: Secondary | ICD-10-CM | POA: Insufficient documentation

## 2019-03-27 DIAGNOSIS — Z20822 Contact with and (suspected) exposure to covid-19: Secondary | ICD-10-CM | POA: Insufficient documentation

## 2019-03-27 HISTORY — DX: Malignant (primary) neoplasm, unspecified: C80.1

## 2019-03-27 LAB — CBC
HCT: 47.6 % — ABNORMAL HIGH (ref 36.0–46.0)
Hemoglobin: 15 g/dL (ref 12.0–15.0)
MCH: 26.5 pg (ref 26.0–34.0)
MCHC: 31.5 g/dL (ref 30.0–36.0)
MCV: 84 fL (ref 80.0–100.0)
Platelets: 301 10*3/uL (ref 150–400)
RBC: 5.67 MIL/uL — ABNORMAL HIGH (ref 3.87–5.11)
RDW: 14.5 % (ref 11.5–15.5)
WBC: 6.7 10*3/uL (ref 4.0–10.5)
nRBC: 0 % (ref 0.0–0.2)

## 2019-03-27 LAB — BLOOD GAS, ARTERIAL
Acid-Base Excess: 2.5 mmol/L — ABNORMAL HIGH (ref 0.0–2.0)
Bicarbonate: 26.4 mmol/L (ref 20.0–28.0)
Drawn by: 42180
FIO2: 21
O2 Saturation: 98 %
Patient temperature: 37
pCO2 arterial: 40.2 mmHg (ref 32.0–48.0)
pH, Arterial: 7.432 (ref 7.350–7.450)
pO2, Arterial: 104 mmHg (ref 83.0–108.0)

## 2019-03-27 LAB — COMPREHENSIVE METABOLIC PANEL
ALT: 15 U/L (ref 0–44)
AST: 21 U/L (ref 15–41)
Albumin: 3.7 g/dL (ref 3.5–5.0)
Alkaline Phosphatase: 78 U/L (ref 38–126)
Anion gap: 12 (ref 5–15)
BUN: 9 mg/dL (ref 8–23)
CO2: 23 mmol/L (ref 22–32)
Calcium: 9.4 mg/dL (ref 8.9–10.3)
Chloride: 102 mmol/L (ref 98–111)
Creatinine, Ser: 0.71 mg/dL (ref 0.44–1.00)
GFR calc Af Amer: >60 mL/min (ref >60)
GFR calc non Af Amer: >60 mL/min (ref >60)
Glucose, Bld: 108 mg/dL — ABNORMAL HIGH (ref 70–99)
Potassium: 3.9 mmol/L (ref 3.5–5.1)
Sodium: 137 mmol/L (ref 135–145)
Total Bilirubin: 0.7 mg/dL (ref 0.3–1.2)
Total Protein: 6.9 g/dL (ref 6.5–8.1)

## 2019-03-27 LAB — URINALYSIS, ROUTINE W REFLEX MICROSCOPIC
Bilirubin Urine: NEGATIVE
Glucose, UA: NEGATIVE mg/dL
Hgb urine dipstick: NEGATIVE
Ketones, ur: NEGATIVE mg/dL
Leukocytes,Ua: NEGATIVE
Nitrite: NEGATIVE
Protein, ur: NEGATIVE mg/dL
Specific Gravity, Urine: 1.005 (ref 1.005–1.030)
pH: 8 (ref 5.0–8.0)

## 2019-03-27 LAB — SURGICAL PCR SCREEN
MRSA, PCR: NEGATIVE
Staphylococcus aureus: NEGATIVE

## 2019-03-27 LAB — PROTIME-INR
INR: 0.9 (ref 0.8–1.2)
Prothrombin Time: 12 seconds (ref 11.4–15.2)

## 2019-03-27 LAB — APTT: aPTT: 26 seconds (ref 24–36)

## 2019-03-27 LAB — SARS CORONAVIRUS 2 (TAT 6-24 HRS): SARS Coronavirus 2: NEGATIVE

## 2019-03-27 LAB — ABO/RH: ABO/RH(D): A POS

## 2019-03-27 NOTE — Progress Notes (Signed)
PCP:  Tracie Harrier, MD Cardiologist:  denies  EKG:  03/27/19 CXR:  03/27/19 ECHO:  denies Stress Test:  09/06/17  Care everywhere Cardiac Cath:  denies  Covid test 03/27/19  Patient denies shortness of breath, fever, cough, and chest pain at PAT appointment.  Patient verbalized understanding of instructions provided today at the PAT appointment.  Patient asked to review instructions at home and day of surgery.

## 2019-03-28 LAB — PULMONARY FUNCTION TEST

## 2019-03-28 NOTE — Anesthesia Preprocedure Evaluation (Addendum)
Anesthesia Evaluation  Patient identified by MRN, date of birth, ID band Patient awake    Reviewed: Allergy & Precautions, NPO status , Patient's Chart, lab work & pertinent test results  Airway Mallampati: II  TM Distance: >3 FB Neck ROM: Full    Dental no notable dental hx.    Pulmonary former smoker,  Malignant neoplasm of right upper lobe of lung    Pulmonary exam normal breath sounds clear to auscultation       Cardiovascular hypertension, Pt. on medications Normal cardiovascular exam Rhythm:Regular Rate:Normal  ECG: SB, rate 59   Neuro/Psych  Neuromuscular disease negative psych ROS   GI/Hepatic Neg liver ROS, GERD  Medicated and Controlled,  Endo/Other  Hypothyroidism   Renal/GU negative Renal ROS     Musculoskeletal  (+) Arthritis , Fibromyalgia -  Abdominal (+) + obese,   Peds  Hematology negative hematology ROS (+)   Anesthesia Other Findings RUL ADENOCARCINOMA  Reproductive/Obstetrics                          Anesthesia Physical Anesthesia Plan  ASA: III  Anesthesia Plan: General   Post-op Pain Management:    Induction: Intravenous  PONV Risk Score and Plan: 3 and Midazolam, Dexamethasone, Ondansetron and Treatment may vary due to age or medical condition  Airway Management Planned: Double Lumen EBT  Additional Equipment: Arterial line, CVP and Ultrasound Guidance Line Placement  Intra-op Plan:   Post-operative Plan: Extubation in OR  Informed Consent: I have reviewed the patients History and Physical, chart, labs and discussed the procedure including the risks, benefits and alternatives for the proposed anesthesia with the patient or authorized representative who has indicated his/her understanding and acceptance.     Dental advisory given  Plan Discussed with: CRNA  Anesthesia Plan Comments:        Anesthesia Quick Evaluation                                   Anesthesia Evaluation  Patient identified by MRN, date of birth, ID band Patient awake    Reviewed: Allergy & Precautions, NPO status , Patient's Chart, lab work & pertinent test results  History of Anesthesia Complications Negative for: history of anesthetic complications  Airway Mallampati: II  TM Distance: >3 FB Neck ROM: Full    Dental  (+) Missing, Caps   Pulmonary neg sleep apnea, neg COPD, former smoker,    breath sounds clear to auscultation- rhonchi (-) wheezing      Cardiovascular hypertension, Pt. on medications (-) CAD, (-) Past MI, (-) Cardiac Stents and (-) CABG  Rhythm:Regular Rate:Normal - Systolic murmurs and - Diastolic murmurs    Neuro/Psych neg Seizures negative neurological ROS  negative psych ROS   GI/Hepatic Neg liver ROS, GERD  ,  Endo/Other  neg diabetesHypothyroidism   Renal/GU negative Renal ROS     Musculoskeletal  (+) Arthritis , Fibromyalgia -  Abdominal (+) + obese,   Peds  Hematology negative hematology ROS (+)   Anesthesia Other Findings Past Medical History: No date: Arthritis No date: Chicken pox No date: Dyspnea No date: Environmental and seasonal allergies No date: Fibromyalgia No date: GERD (gastroesophageal reflux disease) No date: Hemorrhoid No date: Hypersomnia No date: Hypertension No date: Hypothyroidism 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 No date: Osteoarthritis No date: Thyroid disease   Reproductive/Obstetrics  Anesthesia Physical Anesthesia Plan  ASA: II  Anesthesia Plan: General   Post-op Pain Management:    Induction: Intravenous  PONV Risk Score and Plan: 2 and Dexamethasone, Ondansetron and Midazolam  Airway Management Planned: Oral ETT  Additional Equipment:   Intra-op Plan:   Post-operative Plan: Extubation in OR  Informed Consent: I have reviewed the patients History and Physical, chart, labs and discussed the procedure  including the risks, benefits and alternatives for the proposed anesthesia with the patient or authorized representative who has indicated his/her understanding and acceptance.     Dental advisory given  Plan Discussed with: CRNA and Anesthesiologist  Anesthesia Plan Comments:         Anesthesia Quick Evaluation

## 2019-03-29 ENCOUNTER — Inpatient Hospital Stay (HOSPITAL_COMMUNITY): Payer: Medicare HMO | Admitting: Vascular Surgery

## 2019-03-29 ENCOUNTER — Other Ambulatory Visit: Payer: Self-pay

## 2019-03-29 ENCOUNTER — Inpatient Hospital Stay (HOSPITAL_COMMUNITY)
Admission: RE | Admit: 2019-03-29 | Discharge: 2019-04-04 | DRG: 164 | Disposition: A | Discharge status: Home / Self Care | Payer: Medicare HMO | Attending: Thoracic Surgery (Cardiothoracic Vascular Surgery) | Admitting: Thoracic Surgery (Cardiothoracic Vascular Surgery)

## 2019-03-29 ENCOUNTER — Encounter (HOSPITAL_COMMUNITY): Payer: Self-pay | Admitting: Thoracic Surgery (Cardiothoracic Vascular Surgery)

## 2019-03-29 ENCOUNTER — Encounter (HOSPITAL_COMMUNITY)
Admission: RE | Disposition: A | Discharge status: Home / Self Care | Payer: Self-pay | Source: Home / Self Care | Attending: Thoracic Surgery (Cardiothoracic Vascular Surgery)

## 2019-03-29 ENCOUNTER — Inpatient Hospital Stay (HOSPITAL_COMMUNITY): Payer: Medicare HMO | Admitting: Certified Registered"

## 2019-03-29 ENCOUNTER — Inpatient Hospital Stay (HOSPITAL_COMMUNITY): Payer: Medicare HMO

## 2019-03-29 DIAGNOSIS — E039 Hypothyroidism, unspecified: Secondary | ICD-10-CM | POA: Diagnosis present

## 2019-03-29 DIAGNOSIS — Z20822 Contact with and (suspected) exposure to covid-19: Secondary | ICD-10-CM | POA: Diagnosis present

## 2019-03-29 DIAGNOSIS — J939 Pneumothorax, unspecified: Secondary | ICD-10-CM

## 2019-03-29 DIAGNOSIS — Z87891 Personal history of nicotine dependence: Secondary | ICD-10-CM

## 2019-03-29 DIAGNOSIS — Z6841 Body Mass Index (BMI) 40.0 and over, adult: Secondary | ICD-10-CM

## 2019-03-29 DIAGNOSIS — I10 Essential (primary) hypertension: Secondary | ICD-10-CM | POA: Diagnosis present

## 2019-03-29 DIAGNOSIS — E876 Hypokalemia: Secondary | ICD-10-CM | POA: Diagnosis present

## 2019-03-29 DIAGNOSIS — Z79899 Other long term (current) drug therapy: Secondary | ICD-10-CM

## 2019-03-29 DIAGNOSIS — Z79890 Hormone replacement therapy (postmenopausal): Secondary | ICD-10-CM | POA: Diagnosis not present

## 2019-03-29 DIAGNOSIS — J9382 Other air leak: Secondary | ICD-10-CM | POA: Diagnosis not present

## 2019-03-29 DIAGNOSIS — K219 Gastro-esophageal reflux disease without esophagitis: Secondary | ICD-10-CM | POA: Diagnosis present

## 2019-03-29 DIAGNOSIS — I480 Paroxysmal atrial fibrillation: Secondary | ICD-10-CM | POA: Diagnosis present

## 2019-03-29 DIAGNOSIS — C3411 Malignant neoplasm of upper lobe, right bronchus or lung: Principal | ICD-10-CM | POA: Diagnosis present

## 2019-03-29 DIAGNOSIS — Z881 Allergy status to other antibiotic agents status: Secondary | ICD-10-CM

## 2019-03-29 DIAGNOSIS — Z791 Long term (current) use of non-steroidal anti-inflammatories (NSAID): Secondary | ICD-10-CM

## 2019-03-29 DIAGNOSIS — M797 Fibromyalgia: Secondary | ICD-10-CM | POA: Diagnosis present

## 2019-03-29 DIAGNOSIS — Z9884 Bariatric surgery status: Secondary | ICD-10-CM | POA: Diagnosis not present

## 2019-03-29 DIAGNOSIS — Z902 Acquired absence of lung [part of]: Secondary | ICD-10-CM

## 2019-03-29 HISTORY — PX: LOBECTOMY: SHX5089

## 2019-03-29 HISTORY — PX: INTERCOSTAL NERVE BLOCK: SHX5021

## 2019-03-29 HISTORY — PX: NODE DISSECTION: SHX5269

## 2019-03-29 LAB — PREPARE RBC (CROSSMATCH)

## 2019-03-29 SURGERY — LOBECTOMY, LUNG, ROBOT-ASSISTED, USING VATS
Anesthesia: General | Site: Chest | Laterality: Right

## 2019-03-29 MED ORDER — DIPHENHYDRAMINE HCL 12.5 MG/5ML PO ELIX: 12.5000 mg | ORAL_SOLUTION | Freq: Four times a day (QID) | ORAL | Status: DC | PRN

## 2019-03-29 MED ORDER — MIDAZOLAM HCL 2 MG/2ML IJ SOLN
INTRAMUSCULAR | Status: AC
  Filled 2019-03-29: qty 2

## 2019-03-29 MED ORDER — PROPOFOL 10 MG/ML IV BOLUS
INTRAVENOUS | Status: AC
  Filled 2019-03-29: qty 20

## 2019-03-29 MED ORDER — SUGAMMADEX SODIUM 200 MG/2ML IV SOLN
INTRAVENOUS | Status: DC | PRN
  Administered 2019-03-29: 300 mg via INTRAVENOUS

## 2019-03-29 MED ORDER — ACETAMINOPHEN 10 MG/ML IV SOLN
INTRAVENOUS | Status: AC
  Filled 2019-03-29: qty 100

## 2019-03-29 MED ORDER — ONDANSETRON HCL 4 MG/2ML IJ SOLN: 4.0000 mg | Freq: Once | INTRAMUSCULAR | Status: DC | PRN

## 2019-03-29 MED ORDER — LACTATED RINGERS IV SOLN: INTRAVENOUS | Status: DC | PRN

## 2019-03-29 MED ORDER — METOCLOPRAMIDE HCL 5 MG/ML IJ SOLN
10.0000 mg | Freq: Four times a day (QID) | INTRAMUSCULAR | Status: DC
  Administered 2019-03-29 – 2019-03-31 (×6): 10 mg via INTRAVENOUS
  Filled 2019-03-29 (×9): qty 2

## 2019-03-29 MED ORDER — OXYCODONE HCL 5 MG PO TABS: 5.0000 mg | ORAL_TABLET | ORAL | Status: DC | PRN

## 2019-03-29 MED ORDER — FENTANYL CITRATE (PF) 250 MCG/5ML IJ SOLN
INTRAMUSCULAR | Status: AC
  Filled 2019-03-29: qty 5

## 2019-03-29 MED ORDER — SODIUM CHLORIDE 0.9 % IV SOLN: INTRAVENOUS | Status: DC | PRN

## 2019-03-29 MED ORDER — CYCLOBENZAPRINE HCL 10 MG PO TABS: 10.0000 mg | ORAL_TABLET | Freq: Three times a day (TID) | ORAL | Status: DC | PRN

## 2019-03-29 MED ORDER — CHLORHEXIDINE GLUCONATE CLOTH 2 % EX PADS
6.0000 | MEDICATED_PAD | Freq: Every day | CUTANEOUS | Status: DC
  Administered 2019-03-30 – 2019-03-31 (×2): 6 via TOPICAL

## 2019-03-29 MED ORDER — ACETAMINOPHEN 10 MG/ML IV SOLN
1000.0000 mg | Freq: Once | INTRAVENOUS | Status: DC | PRN
  Administered 2019-03-29: 12:00:00 1000 mg via INTRAVENOUS

## 2019-03-29 MED ORDER — FENTANYL CITRATE (PF) 250 MCG/5ML IJ SOLN
INTRAMUSCULAR | Status: DC | PRN
  Administered 2019-03-29: 50 ug via INTRAVENOUS
  Administered 2019-03-29: 100 ug via INTRAVENOUS
  Administered 2019-03-29 (×4): 50 ug via INTRAVENOUS

## 2019-03-29 MED ORDER — ACETAMINOPHEN 160 MG/5ML PO SOLN: 1000.0000 mg | Freq: Four times a day (QID) | ORAL | Status: AC

## 2019-03-29 MED ORDER — CEFAZOLIN SODIUM-DEXTROSE 2-4 GM/100ML-% IV SOLN
2.0000 g | Freq: Three times a day (TID) | INTRAVENOUS | Status: AC
  Administered 2019-03-29 – 2019-03-30 (×2): 2 g via INTRAVENOUS
  Filled 2019-03-29 (×2): qty 100

## 2019-03-29 MED ORDER — FLUTICASONE PROPIONATE 50 MCG/ACT NA SUSP
2.0000 | Freq: Every day | NASAL | Status: DC | PRN
  Filled 2019-03-29: qty 16

## 2019-03-29 MED ORDER — PHENYLEPHRINE HCL-NACL 10-0.9 MG/250ML-% IV SOLN
INTRAVENOUS | Status: DC | PRN
  Administered 2019-03-29: 15 ug/min via INTRAVENOUS

## 2019-03-29 MED ORDER — MORPHINE SULFATE 2 MG/ML IV SOLN
INTRAVENOUS | Status: AC
  Filled 2019-03-29: qty 30

## 2019-03-29 MED ORDER — ROCURONIUM BROMIDE 10 MG/ML (PF) SYRINGE
PREFILLED_SYRINGE | INTRAVENOUS | Status: AC
  Filled 2019-03-29: qty 20

## 2019-03-29 MED ORDER — FENTANYL CITRATE (PF) 100 MCG/2ML IJ SOLN
25.0000 ug | INTRAMUSCULAR | Status: DC | PRN
  Administered 2019-03-29 (×2): 50 ug via INTRAVENOUS

## 2019-03-29 MED ORDER — NALOXONE HCL 0.4 MG/ML IJ SOLN: 0.4000 mg | INTRAMUSCULAR | Status: DC | PRN

## 2019-03-29 MED ORDER — ZOLPIDEM TARTRATE 5 MG PO TABS
5.0000 mg | ORAL_TABLET | Freq: Every day | ORAL | Status: DC
  Administered 2019-03-29 – 2019-04-03 (×6): 5 mg via ORAL
  Filled 2019-03-29 (×6): qty 1

## 2019-03-29 MED ORDER — SENNOSIDES-DOCUSATE SODIUM 8.6-50 MG PO TABS: 1.0000 | ORAL_TABLET | Freq: Every day | ORAL | Status: DC

## 2019-03-29 MED ORDER — BISACODYL 5 MG PO TBEC
10.0000 mg | DELAYED_RELEASE_TABLET | Freq: Every day | ORAL | Status: DC
  Administered 2019-03-30 – 2019-04-01 (×3): 10 mg via ORAL
  Filled 2019-03-29 (×3): qty 2

## 2019-03-29 MED ORDER — KETOROLAC TROMETHAMINE 15 MG/ML IJ SOLN: 15.0000 mg | Freq: Four times a day (QID) | INTRAMUSCULAR | Status: AC | PRN

## 2019-03-29 MED ORDER — HYDRALAZINE HCL 20 MG/ML IJ SOLN
10.0000 mg | Freq: Four times a day (QID) | INTRAMUSCULAR | Status: DC | PRN
  Administered 2019-03-29: 16:00:00 10 mg via INTRAVENOUS
  Filled 2019-03-29: qty 1

## 2019-03-29 MED ORDER — ROCURONIUM BROMIDE 10 MG/ML (PF) SYRINGE
PREFILLED_SYRINGE | INTRAVENOUS | Status: DC | PRN
  Administered 2019-03-29: 20 mg via INTRAVENOUS
  Administered 2019-03-29: 10 mg via INTRAVENOUS
  Administered 2019-03-29 (×2): 20 mg via INTRAVENOUS
  Administered 2019-03-29: 60 mg via INTRAVENOUS

## 2019-03-29 MED ORDER — BUPIVACAINE LIPOSOME 1.3 % IJ SUSP
20.0000 mL | Freq: Once | INTRAMUSCULAR | Status: DC
  Filled 2019-03-29: qty 20

## 2019-03-29 MED ORDER — DEXAMETHASONE SODIUM PHOSPHATE 10 MG/ML IJ SOLN
INTRAMUSCULAR | Status: AC
  Filled 2019-03-29: qty 1

## 2019-03-29 MED ORDER — LEVOTHYROXINE SODIUM 100 MCG PO TABS
200.0000 ug | ORAL_TABLET | Freq: Every day | ORAL | Status: DC
  Administered 2019-03-30 – 2019-04-03 (×5): 200 ug via ORAL
  Filled 2019-03-29 (×5): qty 2

## 2019-03-29 MED ORDER — HEMOSTATIC AGENTS (NO CHARGE) OPTIME
TOPICAL | Status: DC | PRN
  Administered 2019-03-29: 1 via TOPICAL
  Administered 2019-03-29: 2 via TOPICAL

## 2019-03-29 MED ORDER — DEXAMETHASONE SODIUM PHOSPHATE 10 MG/ML IJ SOLN
INTRAMUSCULAR | Status: DC | PRN
  Administered 2019-03-29: 10 mg via INTRAVENOUS

## 2019-03-29 MED ORDER — IPRATROPIUM-ALBUTEROL 0.5-2.5 (3) MG/3ML IN SOLN
3.0000 mL | Freq: Four times a day (QID) | RESPIRATORY_TRACT | Status: DC
  Administered 2019-03-29: 21:00:00 3 mL via RESPIRATORY_TRACT
  Filled 2019-03-29: qty 3

## 2019-03-29 MED ORDER — IPRATROPIUM-ALBUTEROL 0.5-2.5 (3) MG/3ML IN SOLN
3.0000 mL | Freq: Three times a day (TID) | RESPIRATORY_TRACT | Status: DC
  Administered 2019-03-30 – 2019-04-01 (×9): 3 mL via RESPIRATORY_TRACT
  Filled 2019-03-29 (×9): qty 3

## 2019-03-29 MED ORDER — TRIAMTERENE-HCTZ 37.5-25 MG PO TABS
1.0000 | ORAL_TABLET | ORAL | Status: AC
  Administered 2019-03-29: 18:00:00 1 via ORAL
  Filled 2019-03-29 (×2): qty 1

## 2019-03-29 MED ORDER — MORPHINE SULFATE 2 MG/ML IV SOLN
INTRAVENOUS | Status: DC
  Administered 2019-03-30 (×2): 3 mg via INTRAVENOUS
  Administered 2019-03-31: 1 mg via INTRAVENOUS
  Administered 2019-03-31: 0 mg via INTRAVENOUS
  Administered 2019-03-31: 2 mg via INTRAVENOUS
  Administered 2019-03-31: 3 mg via INTRAVENOUS
  Administered 2019-04-01: 2 mg via INTRAVENOUS
  Filled 2019-03-29: qty 30

## 2019-03-29 MED ORDER — PANTOPRAZOLE SODIUM 40 MG PO TBEC
80.0000 mg | DELAYED_RELEASE_TABLET | Freq: Every day | ORAL | Status: DC
  Administered 2019-03-30 – 2019-04-04 (×6): 80 mg via ORAL
  Filled 2019-03-29 (×6): qty 2

## 2019-03-29 MED ORDER — MIDAZOLAM HCL 5 MG/5ML IJ SOLN
INTRAMUSCULAR | Status: DC | PRN
  Administered 2019-03-29 (×2): 1 mg via INTRAVENOUS

## 2019-03-29 MED ORDER — ZOLPIDEM TARTRATE 5 MG PO TABS: 5.0000 mg | ORAL_TABLET | Freq: Every day | ORAL | Status: DC

## 2019-03-29 MED ORDER — PROPOFOL 10 MG/ML IV BOLUS
INTRAVENOUS | Status: DC | PRN
  Administered 2019-03-29: 150 mg via INTRAVENOUS
  Administered 2019-03-29: 50 mg via INTRAVENOUS

## 2019-03-29 MED ORDER — TRAMADOL HCL 50 MG PO TABS: 50.0000 mg | ORAL_TABLET | Freq: Four times a day (QID) | ORAL | Status: DC | PRN

## 2019-03-29 MED ORDER — ALBUTEROL SULFATE (2.5 MG/3ML) 0.083% IN NEBU: 3.0000 mL | INHALATION_SOLUTION | Freq: Four times a day (QID) | RESPIRATORY_TRACT | Status: DC | PRN

## 2019-03-29 MED ORDER — ONDANSETRON HCL 4 MG/2ML IJ SOLN: 4.0000 mg | Freq: Four times a day (QID) | INTRAMUSCULAR | Status: DC | PRN

## 2019-03-29 MED ORDER — ESTRADIOL 1 MG PO TABS
0.5000 mg | ORAL_TABLET | Freq: Every day | ORAL | Status: DC
  Administered 2019-03-29 – 2019-04-04 (×7): 0.5 mg via ORAL
  Filled 2019-03-29 (×7): qty 0.5

## 2019-03-29 MED ORDER — FENTANYL CITRATE (PF) 100 MCG/2ML IJ SOLN
INTRAMUSCULAR | Status: AC
  Filled 2019-03-29: qty 2

## 2019-03-29 MED ORDER — SODIUM CHLORIDE 0.9 % IR SOLN
Status: DC | PRN
  Administered 2019-03-29: 1000 mL

## 2019-03-29 MED ORDER — CEFAZOLIN SODIUM-DEXTROSE 2-4 GM/100ML-% IV SOLN
2.0000 g | INTRAVENOUS | Status: AC
  Administered 2019-03-29: 08:00:00 2 g via INTRAVENOUS

## 2019-03-29 MED ORDER — TRAZODONE HCL 50 MG PO TABS
50.0000 mg | ORAL_TABLET | Freq: Every day | ORAL | Status: DC
  Administered 2019-03-29 – 2019-04-03 (×6): 50 mg via ORAL
  Filled 2019-03-29 (×6): qty 1

## 2019-03-29 MED ORDER — ONDANSETRON HCL 4 MG/2ML IJ SOLN
INTRAMUSCULAR | Status: DC | PRN
  Administered 2019-03-29: 4 mg via INTRAVENOUS

## 2019-03-29 MED ORDER — ENOXAPARIN SODIUM 40 MG/0.4ML ~~LOC~~ SOLN
40.0000 mg | SUBCUTANEOUS | Status: DC
  Administered 2019-03-30 – 2019-04-03 (×5): 40 mg via SUBCUTANEOUS
  Filled 2019-03-29 (×5): qty 0.4

## 2019-03-29 MED ORDER — ACETAMINOPHEN 500 MG PO TABS
1000.0000 mg | ORAL_TABLET | Freq: Four times a day (QID) | ORAL | Status: AC
  Administered 2019-03-29 – 2019-04-03 (×9): 1000 mg via ORAL
  Filled 2019-03-29 (×10): qty 2

## 2019-03-29 MED ORDER — DIPHENHYDRAMINE HCL 50 MG/ML IJ SOLN: 12.5000 mg | Freq: Four times a day (QID) | INTRAMUSCULAR | Status: DC | PRN

## 2019-03-29 MED ORDER — 0.9 % SODIUM CHLORIDE (POUR BTL) OPTIME
TOPICAL | Status: DC | PRN
  Administered 2019-03-29: 3000 mL

## 2019-03-29 MED ORDER — BUPIVACAINE HCL (PF) 0.5 % IJ SOLN
INTRAMUSCULAR | Status: AC
  Filled 2019-03-29: qty 30

## 2019-03-29 MED ORDER — SODIUM CHLORIDE 0.9% IV SOLUTION: Freq: Once | INTRAVENOUS | Status: DC

## 2019-03-29 MED ORDER — CELECOXIB 200 MG PO CAPS
200.0000 mg | ORAL_CAPSULE | Freq: Two times a day (BID) | ORAL | Status: DC
  Administered 2019-03-30 – 2019-04-04 (×11): 200 mg via ORAL
  Filled 2019-03-29 (×11): qty 1

## 2019-03-29 MED ORDER — CEFAZOLIN SODIUM-DEXTROSE 2-4 GM/100ML-% IV SOLN
INTRAVENOUS | Status: AC
  Filled 2019-03-29: qty 100

## 2019-03-29 MED ORDER — SODIUM CHLORIDE 0.9% FLUSH: 9.0000 mL | INTRAVENOUS | Status: DC | PRN

## 2019-03-29 MED ORDER — SODIUM CHLORIDE FLUSH 0.9 % IV SOLN
INTRAVENOUS | Status: DC | PRN
  Administered 2019-03-29: 09:00:00 100 mL

## 2019-03-29 MED ORDER — ONDANSETRON HCL 4 MG/2ML IJ SOLN
INTRAMUSCULAR | Status: AC
  Filled 2019-03-29: qty 2

## 2019-03-29 MED ORDER — DEXTROSE-NACL 5-0.9 % IV SOLN: INTRAVENOUS | Status: DC

## 2019-03-29 SURGICAL SUPPLY — 142 items
APPLIER CLIP ROT 10 11.4 M/L (STAPLE)
BLADE CLIPPER SURG (BLADE) ×4 IMPLANT
BLADE SURG 11 STRL SS (BLADE) ×4 IMPLANT
BLADE SURG SZ11 CARB STEEL (BLADE) ×4 IMPLANT
BNDG COHESIVE 4X5 TAN STRL (GAUZE/BANDAGES/DRESSINGS) ×8 IMPLANT
CANISTER SUCT 3000ML PPV (MISCELLANEOUS) ×8 IMPLANT
CANNULA REDUC XI 12-8 STAPL (CANNULA) ×2
CANNULA REDUC XI 12-8MM STAPL (CANNULA) ×2
CANNULA REDUCER 12-8 DVNC XI (CANNULA) ×4 IMPLANT
CATH THORACIC 28FR (CATHETERS) ×4 IMPLANT
CATH THORACIC 28FR RT ANG (CATHETERS) IMPLANT
CATH THORACIC 36FR (CATHETERS) IMPLANT
CATH THORACIC 36FR RT ANG (CATHETERS) IMPLANT
CLIP APPLIE ROT 10 11.4 M/L (STAPLE) IMPLANT
CLIP VESOCCLUDE MED 6/CT (CLIP) IMPLANT
CNTNR URN SCR LID CUP LEK RST (MISCELLANEOUS) ×32 IMPLANT
CONN ST 1/4X3/8  BEN (MISCELLANEOUS)
CONN ST 1/4X3/8 BEN (MISCELLANEOUS) IMPLANT
CONN Y 3/8X3/8X3/8  BEN (MISCELLANEOUS)
CONN Y 3/8X3/8X3/8 BEN (MISCELLANEOUS) IMPLANT
CONT SPEC 4OZ CLIKSEAL STRL BL (MISCELLANEOUS) IMPLANT
CONT SPEC 4OZ STRL OR WHT (MISCELLANEOUS) ×32
COVER SURGICAL LIGHT HANDLE (MISCELLANEOUS) IMPLANT
DEFOGGER SCOPE WARMER CLEARIFY (MISCELLANEOUS) ×4 IMPLANT
DERMABOND ADVANCED (GAUZE/BANDAGES/DRESSINGS) ×2
DERMABOND ADVANCED .7 DNX12 (GAUZE/BANDAGES/DRESSINGS) ×2 IMPLANT
DRAIN CHANNEL 28F RND 3/8 FF (WOUND CARE) IMPLANT
DRAIN CHANNEL 32F RND 10.7 FF (WOUND CARE) IMPLANT
DRAPE ARM DVNC X/XI (DISPOSABLE) ×8 IMPLANT
DRAPE COLUMN DVNC XI (DISPOSABLE) ×2 IMPLANT
DRAPE CV SPLIT W-CLR ANES SCRN (DRAPES) ×4 IMPLANT
DRAPE DA VINCI XI ARM (DISPOSABLE) ×8
DRAPE DA VINCI XI COLUMN (DISPOSABLE) ×2
DRAPE INCISE IOBAN 66X45 STRL (DRAPES) ×8 IMPLANT
DRAPE ORTHO SPLIT 77X108 STRL (DRAPES) ×2
DRAPE SURG ORHT 6 SPLT 77X108 (DRAPES) ×2 IMPLANT
DRAPE WARM FLUID 44X44 (DRAPES) IMPLANT
ELECT BLADE 6.5 EXT (BLADE) IMPLANT
ELECT REM PT RETURN 9FT ADLT (ELECTROSURGICAL) ×4
ELECTRODE REM PT RTRN 9FT ADLT (ELECTROSURGICAL) ×2 IMPLANT
GAUZE KITTNER 4X10 (MISCELLANEOUS) ×8 IMPLANT
GAUZE KITTNER 4X8 (MISCELLANEOUS) ×8 IMPLANT
GAUZE SPONGE 4X4 12PLY STRL (GAUZE/BANDAGES/DRESSINGS) ×4 IMPLANT
GAUZE SPONGE 4X4 12PLY STRL LF (GAUZE/BANDAGES/DRESSINGS) ×4 IMPLANT
GLOVE BIO SURGEON STRL SZ 6.5 (GLOVE) ×3 IMPLANT
GLOVE BIO SURGEON STRL SZ7 (GLOVE) ×8 IMPLANT
GLOVE BIO SURGEON STRL SZ7.5 (GLOVE) ×4 IMPLANT
GLOVE BIO SURGEONS STRL SZ 6.5 (GLOVE) ×1
GLOVE SURG SIGNA 7.5 PF LTX (GLOVE) ×8 IMPLANT
GLOVE SURG SYN 7.5  E (GLOVE) ×2
GLOVE SURG SYN 7.5 E (GLOVE) ×2 IMPLANT
GLOVE TRIUMPH SURG SIZE 7.5 (KITS) ×8 IMPLANT
GOWN STRL REUS W/ TWL LRG LVL3 (GOWN DISPOSABLE) ×4 IMPLANT
GOWN STRL REUS W/ TWL XL LVL3 (GOWN DISPOSABLE) ×8 IMPLANT
GOWN STRL REUS W/TWL LRG LVL3 (GOWN DISPOSABLE) ×4
GOWN STRL REUS W/TWL XL LVL3 (GOWN DISPOSABLE) ×8
HEMOSTAT SURGICEL 2X14 (HEMOSTASIS) ×20 IMPLANT
IRRIGATOR SUCT 8 DISP DVNC XI (IRRIGATION / IRRIGATOR) ×2 IMPLANT
IRRIGATOR SUCTION 8MM XI DISP (IRRIGATION / IRRIGATOR) ×2
KIT BASIN OR (CUSTOM PROCEDURE TRAY) ×4 IMPLANT
KIT SUCTION CATH 14FR (SUCTIONS) IMPLANT
KIT TURNOVER KIT B (KITS) ×4 IMPLANT
LOOP VESSEL SUPERMAXI WHITE (MISCELLANEOUS) IMPLANT
NEEDLE HYPO 25GX1X1/2 BEV (NEEDLE) ×4 IMPLANT
NEEDLE SPNL 18GX3.5 QUINCKE PK (NEEDLE) ×4 IMPLANT
NS IRRIG 1000ML POUR BTL (IV SOLUTION) ×4 IMPLANT
PACK CHEST (CUSTOM PROCEDURE TRAY) ×4 IMPLANT
PAD ARMBOARD 7.5X6 YLW CONV (MISCELLANEOUS) ×8 IMPLANT
PATTIES SURGICAL 3 X3 (GAUZE/BANDAGES/DRESSINGS)
PATTIES SURGICAL 3X3 (GAUZE/BANDAGES/DRESSINGS) IMPLANT
POUCH ENDO CATCH II 15MM (MISCELLANEOUS) IMPLANT
POUCH RETRIEVAL ECOSAC 10 (ENDOMECHANICALS) ×2 IMPLANT
POUCH RETRIEVAL ECOSAC 10MM (ENDOMECHANICALS) ×2
POUCH SPECIMEN RETRIEVAL 10MM (ENDOMECHANICALS) IMPLANT
RELOAD STAPLE TA45 3.5 REG BLU (ENDOMECHANICALS) ×4 IMPLANT
RELOAD STAPLER 2.5X45 WHT DVNC (STAPLE) ×6 IMPLANT
RELOAD STAPLER 3.5X45 BLU DVNC (STAPLE) ×16 IMPLANT
RELOAD STAPLER 4.3X45 GRN DVNC (STAPLE) ×2 IMPLANT
SCISSORS LAP 5X35 DISP (ENDOMECHANICALS) IMPLANT
SEAL CANN UNIV 5-8 DVNC XI (MISCELLANEOUS) ×4 IMPLANT
SEAL XI 5MM-8MM UNIVERSAL (MISCELLANEOUS) ×4
SEALANT PROGEL (MISCELLANEOUS) IMPLANT
SEALANT SURG COSEAL 4ML (VASCULAR PRODUCTS) IMPLANT
SEALANT SURG COSEAL 8ML (VASCULAR PRODUCTS) IMPLANT
SET IRRIG TUBING LAPAROSCOPIC (IRRIGATION / IRRIGATOR) IMPLANT
SET TUBE SMOKE EVAC HIGH FLOW (TUBING) ×4 IMPLANT
SHEARS HARMONIC HDI 20CM (ELECTROSURGICAL) IMPLANT
SHEET MEDIUM DRAPE 40X70 STRL (DRAPES) ×4 IMPLANT
SOL ANTI FOG 6CC (MISCELLANEOUS) IMPLANT
SOLUTION ANTI FOG 6CC (MISCELLANEOUS)
SOLUTION ELECTROLUBE (MISCELLANEOUS) ×4 IMPLANT
SPECIMEN JAR MEDIUM (MISCELLANEOUS) IMPLANT
SPONGE INTESTINAL PEANUT (DISPOSABLE) IMPLANT
SPONGE TONSIL TAPE 1 RFD (DISPOSABLE) IMPLANT
STAPLE RELOAD 45 2.0 GRAY (STAPLE) ×2
STAPLE RELOAD 45 2.0 GRAY DVNC (STAPLE) ×2 IMPLANT
STAPLER 45 SUREFORM CVD (STAPLE) ×2
STAPLER 45 SUREFORM CVD DVNC (STAPLE) ×2 IMPLANT
STAPLER CANNULA SEAL DVNC XI (STAPLE) ×8 IMPLANT
STAPLER CANNULA SEAL XI (STAPLE) ×8
STAPLER ENDO NO KNIFE (STAPLE) ×4 IMPLANT
STAPLER RELOAD 2.5X45 WHITE (STAPLE) ×6
STAPLER RELOAD 2.5X45 WHT DVNC (STAPLE) ×6
STAPLER RELOAD 3.5X45 BLU DVNC (STAPLE) ×16
STAPLER RELOAD 3.5X45 BLUE (STAPLE) ×16
STAPLER RELOAD 4.3X45 GREEN (STAPLE) ×2
STAPLER RELOAD 4.3X45 GRN DVNC (STAPLE) ×2
STOPCOCK 4 WAY LG BORE MALE ST (IV SETS) ×4 IMPLANT
SUT PDS AB 3-0 SH 27 (SUTURE) IMPLANT
SUT PROLENE 4 0 RB 1 (SUTURE)
SUT PROLENE 4-0 RB1 .5 CRCL 36 (SUTURE) IMPLANT
SUT SILK  1 MH (SUTURE) ×2
SUT SILK 1 MH (SUTURE) ×2 IMPLANT
SUT SILK 1 TIES 10X30 (SUTURE) ×4 IMPLANT
SUT SILK 2 0 SH (SUTURE) IMPLANT
SUT SILK 2 0SH CR/8 30 (SUTURE) ×4 IMPLANT
SUT SILK 3 0 SH 30 (SUTURE) IMPLANT
SUT SILK 3 0SH CR/8 30 (SUTURE) IMPLANT
SUT VIC AB 1 CTX 36 (SUTURE) ×2
SUT VIC AB 1 CTX36XBRD ANBCTR (SUTURE) ×2 IMPLANT
SUT VIC AB 2-0 CTX 36 (SUTURE) ×4 IMPLANT
SUT VIC AB 3-0 MH 27 (SUTURE) IMPLANT
SUT VIC AB 3-0 X1 27 (SUTURE) ×12 IMPLANT
SUT VICRYL 0 TIES 12 18 (SUTURE) ×4 IMPLANT
SUT VICRYL 0 UR6 27IN ABS (SUTURE) ×8 IMPLANT
SUT VICRYL 2 TP 1 (SUTURE) IMPLANT
SYR 10ML LL (SYRINGE) ×4 IMPLANT
SYR 20ML ECCENTRIC (SYRINGE) ×4 IMPLANT
SYR 30ML LL (SYRINGE) ×4 IMPLANT
SYR 30ML SLIP (SYRINGE) ×4 IMPLANT
SYR 50ML LL SCALE MARK (SYRINGE) ×4 IMPLANT
SYSTEM SAHARA CHEST DRAIN ATS (WOUND CARE) ×4 IMPLANT
TAPE CLOTH 4X10 WHT NS (GAUZE/BANDAGES/DRESSINGS) ×4 IMPLANT
TAPE CLOTH SURG 4X10 WHT LF (GAUZE/BANDAGES/DRESSINGS) ×4 IMPLANT
TIP APPLICATOR SPRAY EXTEND 16 (VASCULAR PRODUCTS) IMPLANT
TOWEL GREEN STERILE (TOWEL DISPOSABLE) ×4 IMPLANT
TOWEL GREEN STERILE FF (TOWEL DISPOSABLE) IMPLANT
TRAY FOLEY MTR SLVR 16FR STAT (SET/KITS/TRAYS/PACK) ×4 IMPLANT
TROCAR BLADELESS 12MM (ENDOMECHANICALS) ×4 IMPLANT
TROCAR XCEL BLADELESS 5X75MML (TROCAR) IMPLANT
TUBING EXTENTION W/L.L. (IV SETS) ×4 IMPLANT
WATER STERILE IRR 1000ML POUR (IV SOLUTION) ×4 IMPLANT

## 2019-03-29 NOTE — Transfer of Care (Signed)
Immediate Anesthesia Transfer of Care Note  Patient: Glenda Gomez  Procedure(s) Performed: XI ROBOTIC ASSISTED THORASCOPY- RIGHT UPPER LOBECTOMY (Right Chest) Node Dissection (Chest) Intercostal Nerve Block (Right Chest)  Patient Location: PACU  Anesthesia Type:General  Level of Consciousness: awake, alert , oriented and drowsy  Airway & Oxygen Therapy: Patient Spontanous Breathing and Patient connected to face mask oxygen  Post-op Assessment: Report given to RN, Post -op Vital signs reviewed and stable and Patient moving all extremities X 4  Post vital signs: Reviewed and stable  Last Vitals:  Vitals Value Taken Time  BP 161/83 03/29/19 1145  Temp    Pulse 56 03/29/19 1147  Resp 14 03/29/19 1147  SpO2 100 % 03/29/19 1147  Vitals shown include unvalidated device data.  Last Pain:  Vitals:   03/29/19 0604  TempSrc:   PainSc: 0-No pain         Complications: No apparent anesthesia complications

## 2019-03-29 NOTE — Anesthesia Procedure Notes (Signed)
Central Venous Catheter Insertion Performed by: Annye Asa, MD, anesthesiologist Start/End2/01/2020 6:48 AM, 03/29/2019 6:59 AM Patient location: Pre-op. Preanesthetic checklist: patient identified, IV checked, site marked, risks and benefits discussed, surgical consent, monitors and equipment checked, pre-op evaluation, timeout performed and anesthesia consent Position: supine Lidocaine 1% used for infiltration and patient sedated Hand hygiene performed , maximum sterile barriers used  and Seldinger technique used Catheter size: 8 Fr Total catheter length 16. Central line was placed.Double lumen Procedure performed using ultrasound guided technique. Ultrasound Notes:anatomy identified, needle tip was noted to be adjacent to the nerve/plexus identified, no ultrasound evidence of intravascular and/or intraneural injection and image(s) printed for medical record Attempts: 1 Following insertion, line sutured, dressing applied and Biopatch. Post procedure assessment: blood return through all ports, free fluid flow and no air  Patient tolerated the procedure well with no immediate complications. Additional procedure comments: CVP: Timeout, sterile prep, drape, FBP R neck.  Supine position.  1% lido local, finder and trocar RIJ 1st pass with US guidance.  2 lumen placed over J wire. Biopatch and sterile dressing on.  Patient tolerated well.  VSS.  Jenita Seashore, MD.

## 2019-03-29 NOTE — Progress Notes (Signed)
Latest arterial [id number]'I systolic after hydralazine  Iv given.

## 2019-03-29 NOTE — Brief Op Note (Addendum)
03/29/2019  11:22 AM  PATIENT:  Glenda Gomez  69 y.o. female  PRE-OPERATIVE DIAGNOSIS:  RUL ADENOCARCINOMA- Clinical stage IA (T1N0)  POST-OPERATIVE DIAGNOSIS:  RUL ADENOCARCINOMA- Clinical stage IA (T1N0)  PROCEDURE:  Procedure(s):  XI ROBOTIC ASSISTED THORASCOPY- RIGHT UPPER LOBECTOMY (Right) Lymph Node Dissection Intercostal Nerve Blocks (Right)  SURGEON:  Surgeon(s) and Role:    * [name], MD - Primary    * Lightfoot, Lucile Crater, MD - Assisting  PHYSICIAN ASSISTANT: Ellwood Handler PA-C  ANESTHESIA:   general  EBL:  200 mL   BLOOD ADMINISTERED:none  DRAINS: Right Pleural Chest Tube   LOCAL MEDICATIONS USED:  BUPIVICAINE   SPECIMEN:  Source of Specimen:  Right Upper Lobe, Lymph Nodes  DISPOSITION OF SPECIMEN:  PATHOLOGY  COUNTS:  YES  TOURNIQUET:  * No tourniquets in log *  DICTATION: .Dragon Dictation  PLAN OF CARE: Admit to inpatient   PATIENT DISPOSITION:  PACU - hemodynamically stable.   Delay start of Pharmacological VTE agent (>24hrs) due to surgical blood loss or risk of bleeding: no

## 2019-03-29 NOTE — Progress Notes (Signed)
Blood pressure 864-847 systolic, asymptomatic, PA made aware with order. Hydralazine iv given. Continue to monitor.

## 2019-03-29 NOTE — Anesthesia Postprocedure Evaluation (Signed)
Anesthesia Post Note  Patient: Glenda Gomez  Procedure(s) Performed: XI ROBOTIC ASSISTED THORASCOPY- RIGHT UPPER LOBECTOMY (Right Chest) Node Dissection (Chest) Intercostal Nerve Block (Right Chest)     Patient location during evaluation: PACU Anesthesia Type: General Level of consciousness: awake and alert Pain management: pain level controlled Vital Signs Assessment: post-procedure vital signs reviewed and stable Respiratory status: spontaneous breathing, nonlabored ventilation, respiratory function stable and patient connected to nasal cannula oxygen Cardiovascular status: blood pressure returned to baseline and stable Postop Assessment: no apparent nausea or vomiting Anesthetic complications: no    Last Vitals:  Vitals:   03/29/19 1400 03/29/19 1415  BP: (!) 184/93   Pulse: (!) 49   Resp: 13   Temp:  (!) 36.2 C  SpO2: 100%     Last Pain:  Vitals:   03/29/19 1400  TempSrc:   PainSc: 2                  Vong Garringer P Luwanna Brossman

## 2019-03-29 NOTE — Progress Notes (Signed)
Chest tube site dressing saturated with serosanguinous drainage. Dressing changed , tubing anchored . Continue to monitor.

## 2019-03-29 NOTE — Anesthesia Procedure Notes (Signed)
Arterial Line Insertion Start/End2/01/2020 6:50 AM, 03/29/2019 7:00 AM Performed by: Gaylene Brooks, CRNA, CRNA  Preanesthetic checklist: patient identified, IV checked, site marked, risks and benefits discussed, surgical consent, monitors and equipment checked, pre-op evaluation, timeout performed and anesthesia consent Lidocaine 1% used for infiltration and patient sedated Left, radial was placed Catheter size: 20 G Hand hygiene performed  and maximum sterile barriers used  Allen's test indicative of satisfactory collateral circulation Attempts: 1 Procedure performed without using ultrasound guided technique. Following insertion, dressing applied and Biopatch. Post procedure assessment: normal  Patient tolerated the procedure well with no immediate complications.

## 2019-03-29 NOTE — Anesthesia Procedure Notes (Signed)
Procedure Name: Intubation Date/Time: 03/29/2019 7:53 AM Performed by: Gaylene Brooks, CRNA Pre-anesthesia Checklist: Patient identified, Emergency Drugs available, Suction available and Patient being monitored Patient Re-evaluated:Patient Re-evaluated prior to induction Oxygen Delivery Method: Circle System Utilized Preoxygenation: Pre-oxygenation with 100% oxygen Induction Type: IV induction Ventilation: Mask ventilation without difficulty and Oral airway inserted - appropriate to patient size Laryngoscope Size: Mac and 3 Grade View: Grade I Tube type: Oral Endobronchial tube: Double lumen EBT and Left and 35 Fr Number of attempts: 2 Airway Equipment and Method: Stylet and Oral airway Placement Confirmation: ETT inserted through vocal cords under direct vision,  positive ETCO2 and breath sounds checked- equal and bilateral Tube secured with: Tape Dental Injury: Teeth and Oropharynx as per pre-operative assessment  Comments: DL with Mac 3. Grade 1 view. Attempted to place 36F DLT and would not pass through cords despite a great view. There was a lot of resistance while attempting to push through the cords. ETT removed and 18F placed without difficulty or resistance. Placement confirmed with vivasight.

## 2019-03-29 NOTE — Interval H&P Note (Signed)
History and Physical Interval Note:  03/29/2019 7:28 AM  Glenda Gomez  has presented today for surgery, with the diagnosis of RUL ADENOCARCINOMA.  The various methods of treatment have been discussed with the patient and family. After consideration of risks, benefits and other options for treatment, the patient has consented to  Procedure(s): XI ROBOTIC ASSISTED THORASCOPY- RIGHT UPPER LOBECTOMY (Right) as a surgical intervention.  The patient's history has been reviewed, patient examined, no change in status, stable for surgery.  I have reviewed the patient's chart and labs.  Questions were answered to the patient's satisfaction.     [name]

## 2019-03-29 NOTE — Discharge Summary (Signed)
Physician Discharge Summary  Patient ID: Glenda Gomez MRN: [id number] DOB/AGE: June 06, 1950 69 y.o.  Admit date: 03/29/2019 Discharge date: 04/04/2019  Admission Diagnoses:  Patient Active Problem List   Diagnosis Date Noted  . Malignant neoplasm of right upper lobe of lung ([place]) 03/08/2019  . S/P total knee arthroplasty 07/06/2016   Discharge Diagnoses:   Patient Active Problem List   Diagnosis Date Noted  . S/P lobectomy of lung 03/29/2019  . Malignant neoplasm of right upper lobe of lung ([place]) 03/08/2019  . S/P total knee arthroplasty 07/06/2016   Discharged Condition: good  History of Present Illness:  Glenda Gomez is a 69 yo obese white female with known history of gastric bypass, fibromyalgia, reflux, HTN, Hypothyroidism, arthritis,, myositis ossificans, and adenocarcinoma of the right upper lobe.  The patient has a history of tobacco abuse for about 10 years but she quit in 1995.  She was first found to have a lung nodule about 1 year ago.  She developed a cough and shortness of breath at that time.  She had a PET CT scan which showed the lesion to be mildly hypermetabolic.  At that time the patient elected to monitor the nodule.  Repeat CT scan in June showed the nodule to be stable in appearance.  However, follow up scan from December showed the nodule had increased in size.  Repeat PET CT scan was performed and showed the lesion to be more hypermetabolic.  She underwent navigational biopsy on Dr. Lanney Gins on 03/04/2019.  Pathology showed this to be Adenoncarcinoma.  She was referred to Dr. Genevive Bi, but due to injury he is unable to operate currently and she was referred to Dr. Roxan Hockey for evaluation.  She admitted to Dr. Roxan Hockey she becomes short of breath with exertion.  She is limited in her activity due to pain from arthritis and myositis ossificans.  She denies weight loss, wheezing, and shortness of breath at rest.   It was felt the patient should undergo surgical resection.   The risks and benefits of the procedure were explained to the patient and he was agreeable to proceed.  Hospital Course:   Glenda Gomez presented to [facility] on 03/29/2019.  She was taken to the operating room and underwent Robotic Assisted Right Upper Lobectomy with resection of lymph nodes, and intercostal nerve block.  She tolerated the procedure without difficulty, was extubated and taken to the PACU in stable condition.  The patient made good progress.  There was an air leak on water seal.  Her CXR showed a stable right apical space.  Her air leak resolved.  Her chest tube was able to be removed on 04/03/2019.  Follow up CXR showed stable minimal apical space.  She was hypokalemic and her potassium was supplemented accordingly. She developed Atrial Fibrillation with RVR. She was treated with IV Amiodarone and converted to NSR.  She has a history of hypothyroidism.  Her TSH level was checked and was very low at 0.089.  Her synthroid was reduced to 100 mcg daily.  She will require close follow up with her PCP/Oncologist.  She is ambulating without difficulty.  Her incisions are healing without evidence of infection.  She is medically stable for discharge home today.     Significant Diagnostic Studies: PET CT  1. The right upper lobe nodule has a maximum SUV of 2.6, formerly 1.8. Currently this is considered somewhat worrisome for low-grade malignancy. On the other hand, there is been significant interval expansion medially from this  nodule compared to 03/04/2019, favoring mucous plugging in the subtending bronchus given the rapid progression. Such plugging could certainly occur in the setting of a tumor, or could be inflammatory. If percutaneous or bronchoscopic tissue sampling is performed, I would recommend targeting the more lateral portion of this process which corresponds to the original nodule. 2. Subtly accentuated small right hilar lymph node, maximum SUV 3.8, nonspecific. 3.  Other imaging findings of potential clinical significance: Aortic Atherosclerosis (ICD10-I70.0). Coronary atherosclerosis. Mild cardiomegaly.  Treatments: surgery:   Xi robotic video-assisted right thoracoscopy with right upper lobectomy, lymph node dissection and intercostal nerve blocks.  Discharge Exam: Blood pressure (!) 140/47, pulse 62, temperature 98.1 F (36.7 C), temperature source Oral, resp. rate 20, height 5\' 3"  (1.6 m), weight 110.9 kg, SpO2 97 %.  General appearance: alert, cooperative and no distress Heart: regular rate and rhythm Lungs: clear to auscultation bilaterally Abdomen: soft, non-tender; bowel sounds normal; no masses,  no organomegaly Extremities: extremities normal, atraumatic, no cyanosis or edema Wound: clean and dry   Discharge disposition: 01-Home or Self Care   Allergies as of 04/04/2019      Reactions   Flagyl [metronidazole] Diarrhea, Nausea And Vomiting   PASS OUT   Pregabalin Other (See Comments)   "dizziness"      Medication List    TAKE these medications   amiodarone 200 MG tablet Commonly known as: PACERONE Take 1 tablet (200 mg total) by mouth every 12 (twelve) hours. For 5 days, then decrease to 200 mg daily   celecoxib 200 MG capsule Commonly known as: CELEBREX Take 1 capsule (200 mg total) by mouth 2 (two) times daily with a meal. What changed:   how much to take  when to take this   cyclobenzaprine 10 MG tablet Commonly known as: FLEXERIL Take 10 mg by mouth 3 (three) times daily as needed for muscle spasms.   estradiol 0.5 MG tablet Commonly known as: ESTRACE Take 0.5 mg by mouth daily.   fluticasone 50 MCG/ACT nasal spray Commonly known as: FLONASE Place 2 sprays into both nostrils daily as needed for allergies or rhinitis.   furosemide 20 MG tablet Commonly known as: LASIX Take 20 mg by mouth daily as needed for fluid or edema.   levothyroxine 100 MCG tablet Commonly known as: Synthroid Take 1 tablet (100  mcg total) by mouth daily before breakfast. Start taking on: April 05, 2019 What changed:   medication strength  how much to take   omeprazole 20 MG capsule Commonly known as: PRILOSEC Take 20 mg by mouth 2 (two) times daily before a meal.   traMADol 50 MG tablet Commonly known as: ULTRAM Take 50 mg by mouth 2 (two) times daily as needed for moderate pain.   traZODone 50 MG tablet Commonly known as: DESYREL Take 50 mg by mouth at bedtime.   triamterene-hydrochlorothiazide 37.5-25 MG tablet Commonly known as: MAXZIDE-25 Take 1 tablet by mouth daily.   Ventolin HFA 108 (90 Base) MCG/ACT inhaler Generic drug: albuterol Inhale 2 puffs into the lungs every 6 (six) hours as needed for wheezing or shortness of breath.   zolpidem 5 MG tablet Commonly known as: AMBIEN Take 5-7.5 mg by mouth at bedtime.      Follow-up Information    [name], MD Follow up on 04/16/2019.   Specialty: Cardiothoracic Surgery Why: Appointment is at 1:30, please get CXR at 1:00 please get CXR at [facility] located on first floor of our office building Contact information: [address] [place] [address] [phone]        Tracie Harrier, MD Follow up.   Specialty: Internal Medicine Why: please make follow up for 1-2 weeks for repeat TSH check and EKG Contact information: [place] [address] [phone]           Signed:  Ellwood Handler, PA-C 04/04/2019, 7:30 AM

## 2019-03-29 NOTE — Op Note (Signed)
NAMEWINFRED, REDEL MEDICAL RECORD VP:[id number] ACCOUNT [id number] DATE OF BIRTH:1951-01-24 FACILITY: [facility] LOCATION: [facility] PHYSICIAN:Hiral Lukasiewicz Chaya Jan, MD  OPERATIVE REPORT  DATE OF PROCEDURE:  03/29/2019  PREOPERATIVE DIAGNOSIS:  Right upper lobe adenocarcinoma, clinical stage IA (T1, N0).  POSTOPERATIVE DIAGNOSIS:  Right upper lobe adenocarcinoma, clinical stage IA (T1, N0).  PROCEDURE:   Xi robotic video-assisted right thoracoscopy Right upper lobectomy, Lymph node dissection and Intercostal nerve blocks.  SURGEON:  Modesto Charon, MD  ASSISTANTS:  Melodie Bouillon, MD  and Ellwood Handler, PA  ANESTHESIA:  General.  FINDINGS:  Nodule in upper lobe. Bronchial margin negative for tumor.  Lymph nodes appeared grossly benign.  CLINICAL NOTE:  Mrs. Ramella is a 69 year old woman with a history of tobacco abuse, who was found to have a lung nodule about a year ago after presenting with cough and shortness of breath.  Initially, a PET did not show significant metabolic activity  and she was followed.  Over time, the nodule slowly increased in size and on repeat PET had an SUV of 2.6.  There was some activity in a nonenlarged hilar node, but this was felt to be reactive.  She underwent navigational bronchoscopy and biopsy showed  adenocarcinoma.  She was referred for surgical resection.  She was offered a robotic approach.  The indications, risks, benefits and alternatives were discussed in detail with the patient.  She understood and accepted the risks and agreed to proceed.  OPERATIVE NOTE:  Mrs. Podgurski was brought to the preoperative holding area on 03/29/2019.  The anesthesia service under direction of Dr. Roanna Banning placed an arterial blood pressure monitoring line and a central venous catheter.  She was taken to the  operating room, anesthetized and intubated.  Intravenous antibiotics were administered.  Sequential compression devices were placed on the calves for DVT  prophylaxis.  A Foley catheter was placed.  She was placed in a left lateral decubitus position and  the right chest was prepped and draped in the usual sterile fashion.  Single lung ventilation of the left lung was initiated and was tolerated well throughout the procedure.  A timeout was performed.  A solution containing 20 mL of liposomal bupivacaine, 30 mL of 0.5% bupivacaine and 50 mL of saline was prepared.  This was used for injecting the incision sites locally, as well as for the intercostal nerve blocks.  An incision  was made in the midaxillary line in the 8th intercostal space.  The chest was bluntly entered using a hemostat.  A port was inserted.  The thoracoscope was advanced into the chest.  There was good isolation of the right lung.  Carbon dioxide was  insufflated at 10 mmHg.  Intercostal nerve blocks were performed from the 3rd to the 10th interspace.  A needle was inserted from a posterior approach and 10 mL of the bupivacaine solution was injected into a subpleural plane at each interspace.   12 mm ports were placed anterior and posterior to the camera port in the 8th interspace and then posteriorly an 8 mm port was placed for the retraction arm.  A 12 mm port was placed in the 10th interspace centered between the 2 more anterior ports for  the assistants to utilize.  The robot was deployed and docked.  Targeting was performed.  The instruments were advanced with thoracoscopic visualization.  I then broke scrub and went to the console while Dr. Kipp Brood and Ellwood Handler remained at the patient's bedside.  There were some adhesions of the  lower lobe to the diaphragm and posterior chest wall near the inferior ligament.  These were taken down with bipolar cautery.  The inferior ligament then was divided.  No lymph node was visualized in this area.  The  dissection then was carried superiorly and posteriorly.  The pleural reflection was divided at the hilum posteriorly.  Level 8, 7 and 11  nodes were removed.  The level 11 node at the bifurcation of the right upper lobe bronchus was relatively enlarged,   but appeared grossly benign.  There were multiple level 7 nodes.  The pleural reflection was then divided superiorly between the azygos vein and the hilum.  The pleura overlying the paratracheal nodes was divided.  Care was taken not to use any cautery in  the vicinity of the recurrent nerve or the phrenic nerve and the level 4R nodes were removed, again using bipolar cautery.  The lung then was retracted posteriorly and the pleural reflection was divided at the hilum anteriorly.  Nodes were removed around  the superior pulmonary vein.  The middle lobe branch was identified and preserved.  The upper lobe branches of the superior pulmonary vein then were dissected out, encircled and divided with the robotic vascular stapler.  This was done from the  posterior 12 mm port.  This allowed visualization of the anterior and apical branches which arose as a common trunk and then bifurcated into 3 distinct vessels.  An additional lymph node was dissected out and then the anterior apical trunk was easily  encircled and then divided with the robotic stapler using a vascular cartridge as well.  As dissection was carried down along the main pulmonary artery, a relatively small posterior ascending branch was identified.  An attempt was made to staple this  vessel.  The vessel dissected out relatively easily, but there was not a good angle for the stapler at this point, so attention was turned to the fissure.  The fissure was complete at the confluence of the major and minor fissures.  The pleura was  incised and the dissection was carried out over the basilar and superior segmental pulmonary artery branches.  The fissure then was completed with multiple firings of the robotic stapler using blue cartridges.  At this point, attention was turned to the  minor fissure, which was incomplete.  The robotic  stapler was used to complete the minor fissure down to the level of the pulmonary artery with sequential firings using blue cartridges.  At this point, the bronchus and the posterior ascending branch were  well exposed and the posterior ascending branch was divided using a vascular cartridge on the stapler from an anterior approach.  Next, using the same approach, the stapler with a green cartridge was placed across the right upper lobe bronchus.  A test  inflation showed good aeration of the lower and middle lobes and the stapler was fired, transecting the right upper lobe bronchus.  The final firing of the stapler was performed to complete the minor fissure.  The specimen was placed into an endoscopic  retrieval bag and brought down to the lower part of the chest.  All of the gauze sponges and Surgicel that had been used during the procedure were removed through the assistants' port.  The staple lines and areas of the node dissection were inspected and  there was no active bleeding.  The robot was undocked and I returned the patient's bedside.  The anterior 8th interspace incision was enlarged and the lobectomy specimen  was removed through this incision.  It was sent for frozen section on the bronchial  margin, which returned free of tumor.  The chest was copiously irrigated with warm saline.  A test inflation to 30 cm of water revealed no significant leakage from the staple lines.  There was no leakage at all from the bronchial stump.  A 28-French  chest tube was placed through the original port incision, directed to the apex and secured with a #1 silk suture.  Dual-lung ventilation was resumed.  The remaining port incisions were closed in standard fashion.  Dermabond was applied.  The chest tube  was placed to Pleur-Evac.  She then was placed in a supine position, was extubated in the operating room and taken to the postanesthetic care unit in good condition.  All sponge, needle and instrument counts were  correct at the end of the procedure.  VN/NUANCE  D:03/29/2019 T:03/29/2019 JOB:[id number]

## 2019-03-30 ENCOUNTER — Inpatient Hospital Stay (HOSPITAL_COMMUNITY): Payer: Medicare HMO

## 2019-03-30 LAB — BASIC METABOLIC PANEL
Anion gap: 8 (ref 5–15)
BUN: 8 mg/dL (ref 8–23)
CO2: 28 mmol/L (ref 22–32)
Calcium: 8.2 mg/dL — ABNORMAL LOW (ref 8.9–10.3)
Chloride: 100 mmol/L (ref 98–111)
Creatinine, Ser: 0.76 mg/dL (ref 0.44–1.00)
GFR calc Af Amer: >60 mL/min (ref >60)
GFR calc non Af Amer: >60 mL/min (ref >60)
Glucose, Bld: 129 mg/dL — ABNORMAL HIGH (ref 70–99)
Potassium: 3.5 mmol/L (ref 3.5–5.1)
Sodium: 136 mmol/L (ref 135–145)

## 2019-03-30 LAB — CBC
HCT: 39.9 % (ref 36.0–46.0)
Hemoglobin: 12.6 g/dL (ref 12.0–15.0)
MCH: 26.7 pg (ref 26.0–34.0)
MCHC: 31.6 g/dL (ref 30.0–36.0)
MCV: 84.5 fL (ref 80.0–100.0)
Platelets: 274 K/uL (ref 150–400)
RBC: 4.72 MIL/uL (ref 3.87–5.11)
RDW: 14.4 % (ref 11.5–15.5)
WBC: 10.4 K/uL (ref 4.0–10.5)
nRBC: 0 % (ref 0.0–0.2)

## 2019-03-30 LAB — BLOOD GAS, ARTERIAL
Acid-Base Excess: 4.4 mmol/L — ABNORMAL HIGH (ref 0.0–2.0)
Bicarbonate: 29.1 mmol/L — ABNORMAL HIGH (ref 20.0–28.0)
Drawn by: 345601
FIO2: 32
O2 Saturation: 96.3 %
Patient temperature: 36.7
pCO2 arterial: 48.2 mmHg — ABNORMAL HIGH (ref 32.0–48.0)
pH, Arterial: 7.396 (ref 7.350–7.450)
pO2, Arterial: 79.4 mmHg — ABNORMAL LOW (ref 83.0–108.0)

## 2019-03-30 MED ORDER — POTASSIUM CHLORIDE CRYS ER 20 MEQ PO TBCR: 20.0000 meq | EXTENDED_RELEASE_TABLET | Freq: Once | ORAL | Status: DC

## 2019-03-30 MED ORDER — SENNOSIDES-DOCUSATE SODIUM 8.6-50 MG PO TABS
1.0000 | ORAL_TABLET | Freq: Every day | ORAL | Status: DC
  Administered 2019-03-31 – 2019-04-01 (×2): 1 via ORAL
  Filled 2019-03-30 (×3): qty 1

## 2019-03-30 MED ORDER — POTASSIUM CHLORIDE CRYS ER 20 MEQ PO TBCR
30.0000 meq | EXTENDED_RELEASE_TABLET | Freq: Two times a day (BID) | ORAL | Status: AC
  Administered 2019-03-30 (×2): 30 meq via ORAL
  Filled 2019-03-30 (×2): qty 1

## 2019-03-30 MED ORDER — FUROSEMIDE 10 MG/ML IJ SOLN
20.0000 mg | Freq: Once | INTRAMUSCULAR | Status: AC
  Administered 2019-03-30: 20 mg via INTRAVENOUS
  Filled 2019-03-30: qty 2

## 2019-03-30 NOTE — Progress Notes (Addendum)
      [address]       [place] [address]             [phone]       1 Day Post-Op Procedure(s) (LRB): XI ROBOTIC ASSISTED THORASCOPY- RIGHT UPPER LOBECTOMY (Right) Node Dissection Intercostal Nerve Block (Right)  Subjective: Patient with some numbness near chest tube wound  Objective: Vital signs in last 24 hours: Temp:  [97.2 F (36.2 C)-98.7 F (37.1 C)] 98 F (36.7 C) (02/13 0753) Pulse Rate:  [41-73] 63 (02/13 0753) Cardiac Rhythm: Normal sinus rhythm (02/13 0700) Resp:  [9-28] 18 (02/13 0840) BP: (91-194)/(46-93) 91/74 (02/13 0753) SpO2:  [96 %-100 %] 96 % (02/13 0840) Arterial Line BP: (69-182)/(26-85) 134/52 (02/13 0753) FiO2 (%):  [0 %] 0 % (02/12 1609)      Intake/Output from previous day: 02/12 0701 - 02/13 0700 In: 3810.3 [P.O.:270; I.V.:3337.3; IV Piggyback:203] Out: 1170 [Urine:740; Blood:200; Chest Tube:230]   Physical Exam:  Cardiovascular: RRR Pulmonary: Diminished breath sounds right apex Abdomen: Soft, non tender, bowel sounds present. Extremities: SCDS in place Wounds: Clean and dry.  No erythema or signs of infection. Chest Tube: to water seal, +++ air leak with cough  Lab Results: CBC: Recent Labs    03/27/19 1023 03/30/19 0440  WBC 6.7 10.4  HGB 15.0 12.6  HCT 47.6* 39.9  PLT 301 274   BMET:  Recent Labs    03/27/19 1023 03/30/19 0440  NA 137 136  K 3.9 3.5  CL 102 100  CO2 23 28  GLUCOSE 108* 129*  BUN 9 8  CREATININE 0.71 0.76  CALCIUM 9.4 8.2*    PT/INR:  Recent Labs    03/27/19 1023  LABPROT 12.0  INR 0.9   ABG:  INR: Will add last result for INR, ABG once components are confirmed Will add last 4 CBG results once components are confirmed  Assessment/Plan:  1. CV - SR, SB at times. Will restart Maxzide in am if BP allows. 2.  Pulmonary - ABG results noted. On 2 liters of oxygen via [place]. Chest tube with 230 cc since surgery. Chest tube is to water seal. There is a +++ air leak with  cough. CXR this am appears to show some increase in right apical pneumothorax. As discussed with Dr. Prescott Gum,  . Encourage incentive spirometer. 3. Supplement potassium 4. Remove a line and foley  Donielle M ZimmermanPA-C 03/30/2019,8:50 AM [phone]  Patient examined and chest x-ray performed today reviewed Increase in right apical space with associated air leak-we will place patient on suction 10 cm and repeat x-ray in a.m.  patient examined and medical record reviewed,agree with above note. Tharon Aquas Trigt III 03/30/2019

## 2019-03-30 NOTE — Progress Notes (Signed)
Pt get out of the bed and sat on a recliner for couple hours, tolerated well. Urinated after foley catheter removed, chest tube site is leaking moderate, dressing changed and reinforced, vitals stable, PCA pump continue and pt is using significantly, appetite good, denies SOB and chest pain besides surgical pain, will continue to monitor  Palma Holter, RN

## 2019-03-30 NOTE — Progress Notes (Signed)
Patient became emotional about people coming in and out of her room when she is asleep and they wake her up. Explanation is given that we will cluster her care as to not continuously wake her up. She allowed me to give her night time meds a little early saying that I gave her meds at 2300 last night and didn't fall asleep until 0100. Patient also complains about her pca pump and her IV pump going off and waking her up and she is frustrated by it. Offered some cotton balls for her ears and a reassurance that it will be checked on asap however other patients may require my attention at that time. She verbalizes understanding tearfully. DO NOT ENTER sign placed on patient's door and clustered care will be given to this patient due to her distress.

## 2019-03-31 ENCOUNTER — Inpatient Hospital Stay (HOSPITAL_COMMUNITY): Payer: Medicare HMO

## 2019-03-31 LAB — COMPREHENSIVE METABOLIC PANEL
ALT: 13 U/L (ref 0–44)
AST: 19 U/L (ref 15–41)
Albumin: 2.4 g/dL — ABNORMAL LOW (ref 3.5–5.0)
Alkaline Phosphatase: 58 U/L (ref 38–126)
Anion gap: 7 (ref 5–15)
BUN: 12 mg/dL (ref 8–23)
CO2: 27 mmol/L (ref 22–32)
Calcium: 8.2 mg/dL — ABNORMAL LOW (ref 8.9–10.3)
Chloride: 104 mmol/L (ref 98–111)
Creatinine, Ser: 0.96 mg/dL (ref 0.44–1.00)
GFR calc Af Amer: >60 mL/min (ref >60)
GFR calc non Af Amer: >60 mL/min (ref >60)
Glucose, Bld: 93 mg/dL (ref 70–99)
Potassium: 3.9 mmol/L (ref 3.5–5.1)
Sodium: 138 mmol/L (ref 135–145)
Total Bilirubin: 0.6 mg/dL (ref 0.3–1.2)
Total Protein: 5.3 g/dL — ABNORMAL LOW (ref 6.5–8.1)

## 2019-03-31 LAB — CBC
HCT: 37.9 % (ref 36.0–46.0)
Hemoglobin: 11.6 g/dL — ABNORMAL LOW (ref 12.0–15.0)
MCH: 27 pg (ref 26.0–34.0)
MCHC: 30.6 g/dL (ref 30.0–36.0)
MCV: 88.1 fL (ref 80.0–100.0)
Platelets: 244 10*3/uL (ref 150–400)
RBC: 4.3 MIL/uL (ref 3.87–5.11)
RDW: 14.9 % (ref 11.5–15.5)
WBC: 9.9 10*3/uL (ref 4.0–10.5)
nRBC: 0 % (ref 0.0–0.2)

## 2019-03-31 MED ORDER — POTASSIUM CHLORIDE CRYS ER 20 MEQ PO TBCR
20.0000 meq | EXTENDED_RELEASE_TABLET | Freq: Once | ORAL | Status: AC
  Administered 2019-03-31: 09:00:00 20 meq via ORAL
  Filled 2019-03-31: qty 1

## 2019-03-31 NOTE — Progress Notes (Addendum)
      [address]       [place] [address]             [phone]       2 Days Post-Op Procedure(s) (LRB): XI ROBOTIC ASSISTED THORASCOPY- RIGHT UPPER LOBECTOMY (Right) Node Dissection Intercostal Nerve Block (Right)  Subjective: Patient with some pain at chest tube site.  Objective: Vital signs in last 24 hours: Temp:  [98 F (36.7 C)-98.4 F (36.9 C)] 98.2 F (36.8 C) (02/14 0724) Pulse Rate:  [50-66] 61 (02/14 0724) Cardiac Rhythm: Normal sinus rhythm (02/14 0724) Resp:  [9-20] 14 (02/14 0724) BP: (99-128)/(45-58) 125/55 (02/14 0724) SpO2:  [95 %-99 %] 96 % (02/14 0724)      Intake/Output from previous day: 02/13 0701 - 02/14 0700 In: 600 [P.O.:600] Out: 1380 [Urine:1200; Chest Tube:180]   Physical Exam:  Cardiovascular: RRR Pulmonary: Diminished breath sounds right apex Abdomen: Soft, non tender, bowel sounds present. Extremities: SCDS in place Wounds: Clean and dry.  No erythema or signs of infection. Chest Tube: to water seal, +++ air leak with cough  Lab Results: CBC: Recent Labs    03/30/19 0440 03/31/19 0541  WBC 10.4 9.9  HGB 12.6 11.6*  HCT 39.9 37.9  PLT 274 244   BMET:  Recent Labs    03/30/19 0440 03/31/19 0541  NA 136 138  K 3.5 3.9  CL 100 104  CO2 28 27  GLUCOSE 129* 93  BUN 8 12  CREATININE 0.76 0.96  CALCIUM 8.2* 8.2*    PT/INR:  No results for input(s): LABPROT, INR in the last 72 hours. ABG:  INR: Will add last result for INR, ABG once components are confirmed Will add last 4 CBG results once components are confirmed  Assessment/Plan:  1. CV - SR, SB at times. Will restart Maxzide in am if BP allows. 2.  Pulmonary -  On 2 liters of oxygen via [place]. Chest tube with 180 cc last 24 hours. Chest tube is to 10 cm suction. There is a +++ air leak with cough. CXR this am appears to show some decrease in right apical pneumothorax.  Encourage incentive spirometer. 3. Supplement potassium 4. Remove central  line  Donielle M ZimmermanPA-C 03/31/2019,8:26 AM [phone]  CXR shows improved R apical space Moderate air leak with cough Chest tube to water seal  patient examined and medical record reviewed,agree with above note. Tharon Aquas Trigt III 03/31/2019

## 2019-03-31 NOTE — Progress Notes (Signed)
Pt ambulated in a hallway without having any difficulty, surgical site clean, dry and intact, dressing changed, Chest tube catheter back to water seal, draining moderate, pt is using PCA pump for her pain management actively, CVC out this am, her son is visiting her and in bed side, will continue to monitor the patient.  Palma Holter, RN

## 2019-03-31 NOTE — Progress Notes (Signed)
Rekha RN aware of order to remove CVC.

## 2019-04-01 ENCOUNTER — Inpatient Hospital Stay (HOSPITAL_COMMUNITY): Payer: Medicare HMO

## 2019-04-01 LAB — BASIC METABOLIC PANEL
Anion gap: 7 (ref 5–15)
BUN: 11 mg/dL (ref 8–23)
CO2: 27 mmol/L (ref 22–32)
Calcium: 8.2 mg/dL — ABNORMAL LOW (ref 8.9–10.3)
Chloride: 105 mmol/L (ref 98–111)
Creatinine, Ser: 0.73 mg/dL (ref 0.44–1.00)
GFR calc Af Amer: >60 mL/min (ref >60)
GFR calc non Af Amer: >60 mL/min (ref >60)
Glucose, Bld: 101 mg/dL — ABNORMAL HIGH (ref 70–99)
Potassium: 4.1 mmol/L (ref 3.5–5.1)
Sodium: 139 mmol/L (ref 135–145)

## 2019-04-01 LAB — CBC
HCT: 38.1 % (ref 36.0–46.0)
Hemoglobin: 11.8 g/dL — ABNORMAL LOW (ref 12.0–15.0)
MCH: 27 pg (ref 26.0–34.0)
MCHC: 31 g/dL (ref 30.0–36.0)
MCV: 87.2 fL (ref 80.0–100.0)
Platelets: 269 10*3/uL (ref 150–400)
RBC: 4.37 MIL/uL (ref 3.87–5.11)
RDW: 14.8 % (ref 11.5–15.5)
WBC: 9.2 10*3/uL (ref 4.0–10.5)
nRBC: 0 % (ref 0.0–0.2)

## 2019-04-01 LAB — SURGICAL PATHOLOGY

## 2019-04-01 MED ORDER — LACTULOSE 10 GM/15ML PO SOLN
20.0000 g | Freq: Every day | ORAL | Status: DC | PRN
  Administered 2019-04-01: 21:00:00 20 g via ORAL
  Filled 2019-04-01: qty 30

## 2019-04-01 NOTE — Plan of Care (Signed)
  Problem: Education: Goal: Knowledge of General Education information will improve Description: Including pain rating scale, medication(s)/side effects and non-pharmacologic comfort measures Outcome: Progressing   Problem: Education: Goal: Knowledge of General Education information will improve Description: Including pain rating scale, medication(s)/side effects and non-pharmacologic comfort measures Outcome: Progressing   Problem: Health Behavior/Discharge Planning: Goal: Ability to manage health-related needs will improve Outcome: Progressing   Problem: Clinical Measurements: Goal: Ability to maintain clinical measurements within normal limits will improve Outcome: Progressing Goal: Will remain free from infection Outcome: Progressing Goal: Diagnostic test results will improve Outcome: Progressing Goal: Respiratory complications will improve Outcome: Progressing Goal: Cardiovascular complication will be avoided Outcome: Progressing   Problem: Activity: Goal: Risk for activity intolerance will decrease Outcome: Progressing   Problem: Nutrition: Goal: Adequate nutrition will be maintained Outcome: Progressing   Problem: Coping: Goal: Level of anxiety will decrease Outcome: Progressing   Problem: Elimination: Goal: Will not experience complications related to bowel motility Outcome: Progressing Goal: Will not experience complications related to urinary retention Outcome: Progressing   Problem: Pain Managment: Goal: General experience of comfort will improve Outcome: Progressing   Problem: Safety: Goal: Ability to remain free from injury will improve Outcome: Progressing   Problem: Skin Integrity: Goal: Risk for impaired skin integrity will decrease Outcome: Progressing   Problem: Education: Goal: Knowledge of disease or condition will improve Outcome: Progressing Goal: Knowledge of the prescribed therapeutic regimen will improve Outcome: Progressing    Problem: Activity: Goal: Risk for activity intolerance will decrease Outcome: Progressing   Problem: Cardiac: Goal: Will achieve and/or maintain hemodynamic stability Outcome: Progressing   Problem: Clinical Measurements: Goal: Postoperative complications will be avoided or minimized Outcome: Progressing   Problem: Respiratory: Goal: Respiratory status will improve Outcome: Progressing   Problem: Pain Management: Goal: Pain level will decrease Outcome: Progressing   Problem: Skin Integrity: Goal: Wound healing without signs and symptoms infection will improve Outcome: Progressing

## 2019-04-01 NOTE — Progress Notes (Signed)
Wasted 27mL of Morphine PCA with Sylvan Cheese, RN.

## 2019-04-01 NOTE — Progress Notes (Addendum)
      [address]       [place] [address]             [phone]      3 Days Post-Op Procedure(s) (LRB): XI ROBOTIC ASSISTED THORASCOPY- RIGHT UPPER LOBECTOMY (Right) Node Dissection Intercostal Nerve Block (Right)   Subjective:  Up trying to get her breakfast tray.  Continues to have pain at chest tube site.  No BM yet, is not yet passing gas  Objective: Vital signs in last 24 hours: Temp:  [98.2 F (36.8 C)-99 F (37.2 C)] 98.5 F (36.9 C) (02/15 0347) Pulse Rate:  [61-88] 65 (02/15 0347) Cardiac Rhythm: Normal sinus rhythm (02/14 1900) Resp:  [12-22] 17 (02/15 0351) BP: (110-136)/(42-61) 136/61 (02/15 0347) SpO2:  [91 %-97 %] 97 % (02/15 0351)  Intake/Output from previous day: 02/14 0701 - 02/15 0700 In: 960 [P.O.:960] Out: 1500 [Urine:1200; Chest Tube:300]  General appearance: alert, cooperative and no distress Heart: regular rate and rhythm Lungs: clear to auscultation bilaterally Abdomen: soft, non distended, hypoactive BS Extremities: extremities normal, atraumatic, no cyanosis or edema Wound: clean and dry  Lab Results: Recent Labs    03/31/19 0541 04/01/19 0156  WBC 9.9 9.2  HGB 11.6* 11.8*  HCT 37.9 38.1  PLT 244 269   BMET:  Recent Labs    03/31/19 0541 04/01/19 0156  NA 138 139  K 3.9 4.1  CL 104 105  CO2 27 27  GLUCOSE 93 101*  BUN 12 11  CREATININE 0.96 0.73  CALCIUM 8.2* 8.2*    PT/INR: No results for input(s): LABPROT, INR in the last 72 hours. ABG    Component Value Date/Time   PHART 7.396 03/30/2019 0450   HCO3 29.1 (H) 03/30/2019 0450   O2SAT 96.3 03/30/2019 0450   CBG (last 3)  No results for input(s): GLUCAP in the last 72 hours.  Assessment/Plan: S/P Procedure(s) (LRB): XI ROBOTIC ASSISTED THORASCOPY- RIGHT UPPER LOBECTOMY (Right) Node Dissection Intercostal Nerve Block (Right)  1. CV- NSR, BP is stable on Maxide 2. Pulm- no acute issues, + air leak, mostly around chest tube site... if  compression applied, air leak resolves, needs new dressing, right apical space stable, repeat CXR in AM 3. Hypokalemia resolved 4. GI- BS are hypoactive, will add lactulose prn 5. Dispo- patient stable, air leak improves with pressure against chest tube, air leak likely from around chest tube, repeat CXR in AM   LOS: 3 days    Ellwood Handler, PA-C  04/01/2019 Patient seen and examined, agree with above Will wrap tube with vaseline gauze, but for now consider air leak real Path pending Increase ambulation On SCD + enoxaparin  Remo Lipps C. Roxan Hockey, MD [facility] [phone]

## 2019-04-01 NOTE — Plan of Care (Signed)

## 2019-04-02 ENCOUNTER — Inpatient Hospital Stay (HOSPITAL_COMMUNITY): Payer: Medicare HMO

## 2019-04-02 LAB — BPAM RBC
Blood Product Expiration Date: 202103082359
Blood Product Expiration Date: 202103152359
ISSUE DATE / TIME: 202102120714
ISSUE DATE / TIME: 202102120714
Unit Type and Rh: 6200
Unit Type and Rh: 6200

## 2019-04-02 LAB — TYPE AND SCREEN
ABO/RH(D): A POS
Antibody Screen: NEGATIVE
Unit division: 0
Unit division: 0

## 2019-04-02 MED ORDER — IPRATROPIUM-ALBUTEROL 0.5-2.5 (3) MG/3ML IN SOLN
3.0000 mL | Freq: Two times a day (BID) | RESPIRATORY_TRACT | Status: DC
  Administered 2019-04-02 (×2): 3 mL via RESPIRATORY_TRACT
  Filled 2019-04-02 (×2): qty 3

## 2019-04-02 MED ORDER — AMIODARONE LOAD VIA INFUSION
150.0000 mg | Freq: Once | INTRAVENOUS | Status: AC
  Administered 2019-04-02: 150 mg via INTRAVENOUS
  Filled 2019-04-02: qty 83.34

## 2019-04-02 MED ORDER — AMIODARONE HCL IN DEXTROSE 360-4.14 MG/200ML-% IV SOLN
30.0000 mg/h | INTRAVENOUS | Status: DC
  Administered 2019-04-03: 04:00:00 30 mg/h via INTRAVENOUS

## 2019-04-02 MED ORDER — AMIODARONE HCL IN DEXTROSE 360-4.14 MG/200ML-% IV SOLN
60.0000 mg/h | INTRAVENOUS | Status: DC
  Administered 2019-04-02 – 2019-04-03 (×2): 60 mg/h via INTRAVENOUS
  Filled 2019-04-02 (×2): qty 200

## 2019-04-02 MED ORDER — AMIODARONE HCL 200 MG PO TABS: 200.0000 mg | ORAL_TABLET | Freq: Every day | ORAL | Status: DC

## 2019-04-02 MED ORDER — AMIODARONE HCL 200 MG PO TABS
200.0000 mg | ORAL_TABLET | Freq: Two times a day (BID) | ORAL | Status: DC
  Administered 2019-04-03 – 2019-04-04 (×3): 200 mg via ORAL
  Filled 2019-04-02 (×3): qty 1

## 2019-04-02 NOTE — Progress Notes (Addendum)
      [address]       [place] [address]             [phone]      4 Days Post-Op Procedure(s) (LRB): XI ROBOTIC ASSISTED THORASCOPY- RIGHT UPPER LOBECTOMY (Right) Node Dissection Intercostal Nerve Block (Right)   Subjective:  Patient looks great.  She was up and was able to go to the bathroom and move her bowels this morning.  Otherwise she has no complaints.  Objective: Vital signs in last 24 hours: Temp:  [97.9 F (36.6 C)-98.2 F (36.8 C)] 98 F (36.7 C) (02/16 0411) Pulse Rate:  [68-98] 68 (02/16 0411) Cardiac Rhythm: Normal sinus rhythm;Bundle branch block (02/16 0700) Resp:  [13-18] 16 (02/16 0411) BP: (112-130)/(52-64) 119/59 (02/16 0411) SpO2:  [92 %-98 %] 98 % (02/16 0411)  Intake/Output from previous day: 02/15 0701 - 02/16 0700 In: -  Out: 331 [Urine:1; Chest Tube:330]  General appearance: alert, cooperative and no distress Heart: regular rate and rhythm Lungs: clear to auscultation bilaterally Abdomen: soft, non-tender; bowel sounds normal; no masses,  no organomegaly Extremities: extremities normal, atraumatic, no cyanosis or edema Wound: clean and dry  Lab Results: Recent Labs    03/31/19 0541 04/01/19 0156  WBC 9.9 9.2  HGB 11.6* 11.8*  HCT 37.9 38.1  PLT 244 269   BMET:  Recent Labs    03/31/19 0541 04/01/19 0156  NA 138 139  K 3.9 4.1  CL 104 105  CO2 27 27  GLUCOSE 93 101*  BUN 12 11  CREATININE 0.96 0.73  CALCIUM 8.2* 8.2*    PT/INR: No results for input(s): LABPROT, INR in the last 72 hours. ABG    Component Value Date/Time   PHART 7.396 03/30/2019 0450   HCO3 29.1 (H) 03/30/2019 0450   O2SAT 96.3 03/30/2019 0450   CBG (last 3)  No results for input(s): GLUCAP in the last 72 hours.  Assessment/Plan: S/P Procedure(s) (LRB): XI ROBOTIC ASSISTED THORASCOPY- RIGHT UPPER LOBECTOMY (Right) Node Dissection Intercostal Nerve Block (Right)  1. CV- NSR on Maxide 2. Pulm-+ air leak with cough on water  seal, CXR remains stable, leave in place today 3. GI- constipation resolved 4. Lovenox for DVT prophylaxis 5. Dispo- patient stable, chest tube on water seal with air leak, repeat CXR in AM   LOS: 4 days   Ellwood Handler, PA-C  04/02/2019  Patient seen and examined, agree with above Air leak significantly improved, but still a small one present continue ambulation  [place] C. Roxan Hockey, MD [facility] [phone]

## 2019-04-02 NOTE — Progress Notes (Signed)
Approx 2030 pt HR increased in the 130's-140's. Pt felt SOB and shaky. EKG completed and showed A. Fib RVR. BP 141/60 SpO2 99 on 2L. Rapid response notified due to red MEWS score. On call MD notified as well. Pt started on Amiodarone drip per MD orders. Approx 2243 pt HR 70's back in NSR. Will continue to monitor.

## 2019-04-02 NOTE — Progress Notes (Signed)
  Amiodarone Drug - Drug Interaction Consult Note  Recommendations:  Watch QTc closely with patient also on scheduled Reglan.  Amiodarone is metabolized by the cytochrome P450 system and therefore has the potential to cause many drug interactions. Amiodarone has an average plasma half-life of 50 days (range 20 to 100 days).   There is potential for drug interactions to occur several weeks or months after stopping treatment and the onset of drug interactions may be slow after initiating amiodarone.   []  Statins: Increased risk of myopathy. Simvastatin- restrict dose to 20mg  daily. Other statins: counsel patients to report any muscle pain or weakness immediately.  []  Anticoagulants: Amiodarone can increase anticoagulant effect. Consider warfarin dose reduction. Patients should be monitored closely and the dose of anticoagulant altered accordingly, remembering that amiodarone levels take several weeks to stabilize.  []  Antiepileptics: Amiodarone can increase plasma concentration of phenytoin, the dose should be reduced. Note that small changes in phenytoin dose can result in large changes in levels. Monitor patient and counsel on signs of toxicity.  []  Beta blockers: increased risk of bradycardia, AV block and myocardial depression. Sotalol - avoid concomitant use.  []   Calcium channel blockers (diltiazem and verapamil): increased risk of bradycardia, AV block and myocardial depression.  []   Cyclosporine: Amiodarone increases levels of cyclosporine. Reduced dose of cyclosporine is recommended.  []  Digoxin dose should be halved when amiodarone is started.  []  Diuretics: increased risk of cardiotoxicity if hypokalemia occurs.  []  Oral hypoglycemic agents (glyburide, glipizide, glimepiride): increased risk of hypoglycemia. Patient's glucose levels should be monitored closely when initiating amiodarone therapy.   [x]  Drugs that prolong the QT interval:  Torsades de pointes risk may be increased  with concurrent use - avoid if possible.  Monitor QTc, also keep magnesium/potassium WNL if concurrent therapy can't be avoided. Marland Kitchen Antibiotics: e.g. fluoroquinolones, erythromycin. . Antiarrhythmics: e.g. quinidine, procainamide, disopyramide, sotalol. . Antipsychotics: e.g. phenothiazines, haloperidol.  . Lithium, tricyclic antidepressants, and methadone. Thank You,   Elicia Lamp, PharmD, BCPS Please check AMION for all [facility] [place] contact numbers Clinical Pharmacist 04/02/2019 8:46 PM

## 2019-04-02 NOTE — Plan of Care (Signed)
  Problem: Education: Goal: Knowledge of General Education information will improve Description: Including pain rating scale, medication(s)/side effects and non-pharmacologic comfort measures Outcome: Progressing   Problem: Education: Goal: Knowledge of General Education information will improve Description: Including pain rating scale, medication(s)/side effects and non-pharmacologic comfort measures Outcome: Progressing   Problem: Health Behavior/Discharge Planning: Goal: Ability to manage health-related needs will improve Outcome: Progressing   Problem: Activity: Goal: Risk for activity intolerance will decrease Outcome: Progressing   Problem: Nutrition: Goal: Adequate nutrition will be maintained Outcome: Progressing   Problem: Coping: Goal: Level of anxiety will decrease Outcome: Progressing   Problem: Elimination: Goal: Will not experience complications related to bowel motility Outcome: Progressing Goal: Will not experience complications related to urinary retention Outcome: Progressing   Problem: Pain Managment: Goal: General experience of comfort will improve Outcome: Progressing   Problem: Safety: Goal: Ability to remain free from injury will improve Outcome: Progressing   Problem: Skin Integrity: Goal: Risk for impaired skin integrity will decrease Outcome: Progressing

## 2019-04-03 ENCOUNTER — Inpatient Hospital Stay (HOSPITAL_COMMUNITY): Payer: Medicare HMO

## 2019-04-03 LAB — BASIC METABOLIC PANEL
Anion gap: 10 (ref 5–15)
BUN: 7 mg/dL — ABNORMAL LOW (ref 8–23)
CO2: 25 mmol/L (ref 22–32)
Calcium: 8.6 mg/dL — ABNORMAL LOW (ref 8.9–10.3)
Chloride: 105 mmol/L (ref 98–111)
Creatinine, Ser: 0.71 mg/dL (ref 0.44–1.00)
GFR calc Af Amer: >60 mL/min (ref >60)
GFR calc non Af Amer: >60 mL/min (ref >60)
Glucose, Bld: 122 mg/dL — ABNORMAL HIGH (ref 70–99)
Potassium: 3.6 mmol/L (ref 3.5–5.1)
Sodium: 140 mmol/L (ref 135–145)

## 2019-04-03 LAB — T4, FREE: Free T4: 1.34 ng/dL — ABNORMAL HIGH (ref 0.61–1.12)

## 2019-04-03 LAB — TSH: TSH: 0.089 u[IU]/mL — ABNORMAL LOW (ref 0.350–4.500)

## 2019-04-03 LAB — MAGNESIUM: Magnesium: 1.9 mg/dL (ref 1.7–2.4)

## 2019-04-03 MED ORDER — LEVALBUTEROL HCL 0.63 MG/3ML IN NEBU: 0.6300 mg | INHALATION_SOLUTION | Freq: Three times a day (TID) | RESPIRATORY_TRACT | Status: DC | PRN

## 2019-04-03 MED ORDER — POTASSIUM CHLORIDE CRYS ER 20 MEQ PO TBCR
20.0000 meq | EXTENDED_RELEASE_TABLET | Freq: Every day | ORAL | Status: DC
  Administered 2019-04-03 – 2019-04-04 (×2): 20 meq via ORAL
  Filled 2019-04-03 (×2): qty 1

## 2019-04-03 MED ORDER — LEVOTHYROXINE SODIUM 100 MCG PO TABS
100.0000 ug | ORAL_TABLET | Freq: Every day | ORAL | Status: DC
  Administered 2019-04-04: 100 ug via ORAL
  Filled 2019-04-03: qty 1

## 2019-04-03 MED ORDER — TRIAMTERENE-HCTZ 37.5-25 MG PO TABS
1.0000 | ORAL_TABLET | Freq: Every day | ORAL | Status: DC
  Administered 2019-04-03 – 2019-04-04 (×2): 1 via ORAL
  Filled 2019-04-03 (×2): qty 1

## 2019-04-03 MED ORDER — IPRATROPIUM-ALBUTEROL 0.5-2.5 (3) MG/3ML IN SOLN: 3.0000 mL | Freq: Four times a day (QID) | RESPIRATORY_TRACT | Status: DC | PRN

## 2019-04-03 NOTE — Progress Notes (Addendum)
      [address]       [place] [address]             [phone]      5 Days Post-Op Procedure(s) (LRB): XI ROBOTIC ASSISTED THORASCOPY- RIGHT UPPER LOBECTOMY (Right) Node Dissection Intercostal Nerve Block (Right)   Subjective:  Patient doing okay this morning.  States episode of A. Fib overnight really scared her.  + ambulation + BM  Objective: Vital signs in last 24 hours: Temp:  [98 F (36.7 C)-98.6 F (37 C)] 98.1 F (36.7 C) (02/17 0719) Pulse Rate:  [64-130] 64 (02/17 0719) Cardiac Rhythm: Normal sinus rhythm (02/17 0335) Resp:  [14-27] 18 (02/17 0719) BP: (120-157)/(47-94) 157/63 (02/17 0719) SpO2:  [93 %-99 %] 94 % (02/17 0719)  Intake/Output from previous day: 02/16 0701 - 02/17 0700 In: 688.5 [P.O.:480; I.V.:208.5] Out: 1802 [Urine:1501; Stool:1; Chest Tube:300]  General appearance: alert, cooperative and no distress Heart: regular rate and rhythm Lungs: clear to auscultation bilaterally Abdomen: soft, non-tender; bowel sounds normal; no masses,  no organomegaly Extremities: extremities normal, atraumatic, no cyanosis or edema Wound: clean and dry  Lab Results: Recent Labs    04/01/19 0156  WBC 9.2  HGB 11.8*  HCT 38.1  PLT 269   BMET:  Recent Labs    04/01/19 0156 04/03/19 0230  NA 139 140  K 4.1 3.6  CL 105 105  CO2 27 25  GLUCOSE 101* 122*  BUN 11 7*  CREATININE 0.73 0.71  CALCIUM 8.2* 8.6*    PT/INR: No results for input(s): LABPROT, INR in the last 72 hours. ABG    Component Value Date/Time   PHART 7.396 03/30/2019 0450   HCO3 29.1 (H) 03/30/2019 0450   O2SAT 96.3 03/30/2019 0450   CBG (last 3)  No results for input(s): GLUCAP in the last 72 hours.  Assessment/Plan: S/P Procedure(s) (LRB): XI ROBOTIC ASSISTED THORASCOPY- RIGHT UPPER LOBECTOMY (Right) Node Dissection Intercostal Nerve Block (Right)  1. CV- PAF overnight, converted and is maintaining NSR- stop Amiodarone gtt, start oral regimen at 200  mg BID, she is hypertensive- will restart home Maxzide 2. Pulm- no air leak present, CXR with stable apical space, will likely d/c chest tube today 3. Renal- creatinine WNL, K is borderline at 3.5, with A. Fib will supplement 4. H/O Hypothyroidism- TSH obtained and is very low at 0.089, will decrease synthroid to 100 mcg, check Free T4, will need to follow up with medical doctor 5. Continue Lovenox for DVT prophylaxis 6. Dispo- patient stable, developed A. Fib with RVR overnight, currently maintaining NSR will transition to oral Amiodarone, TSH is very low, synthroid adjusted, likely d/c chest tube today, if CXR and HR remain stable, likely for d/c in AM   LOS: 5 days    Ellwood Handler, PA-C  04/03/2019 Patient seen and examined, agree with above No air leak Dc chest tube Path- T1N0- stage IA  Revonda Standard. Roxan Hockey, MD [facility] [phone]

## 2019-04-04 ENCOUNTER — Other Ambulatory Visit: Payer: Self-pay | Admitting: *Deleted

## 2019-04-04 ENCOUNTER — Other Ambulatory Visit: Payer: Self-pay | Admitting: Physician Assistant

## 2019-04-04 ENCOUNTER — Inpatient Hospital Stay (HOSPITAL_COMMUNITY): Payer: Medicare HMO

## 2019-04-04 LAB — BASIC METABOLIC PANEL
Anion gap: 7 (ref 5–15)
BUN: 7 mg/dL — ABNORMAL LOW (ref 8–23)
CO2: 25 mmol/L (ref 22–32)
Calcium: 8.4 mg/dL — ABNORMAL LOW (ref 8.9–10.3)
Chloride: 106 mmol/L (ref 98–111)
Creatinine, Ser: 0.74 mg/dL (ref 0.44–1.00)
GFR calc Af Amer: >60 mL/min (ref >60)
GFR calc non Af Amer: >60 mL/min (ref >60)
Glucose, Bld: 101 mg/dL — ABNORMAL HIGH (ref 70–99)
Potassium: 3.7 mmol/L (ref 3.5–5.1)
Sodium: 138 mmol/L (ref 135–145)

## 2019-04-04 MED ORDER — LEVOTHYROXINE SODIUM 100 MCG PO TABS: 100.0000 ug | ORAL_TABLET | Freq: Every day | ORAL | 1 refills | Status: DC

## 2019-04-04 MED ORDER — AMIODARONE HCL 200 MG PO TABS: 200.0000 mg | ORAL_TABLET | Freq: Two times a day (BID) | ORAL | 1 refills | Status: DC

## 2019-04-04 NOTE — Progress Notes (Signed)
The proposed treatment discussed in cancer conference 04/04/19 is for discussion purpose only and is not a binding recommendation.  The patient was not physically examined nor present for their treatment options.  Therefore, final treatment plans cannot be decided.

## 2019-04-04 NOTE — Discharge Instructions (Signed)
Discharge Instructions:  1. You may shower, please wash incisions daily with soap and water and keep dry.  If you wish to cover wounds with dressing you may do so but please keep clean and change daily.  No tub baths or swimming until incisions have completely healed.  If your incisions become red or develop any drainage please call our office at [phone]  2. No Driving until cleared by Dr. Leonarda Salon office and you are no longer using narcotic pain medications  3. Fever of 101.5 for at least 24 hours with no source, please contact our office at [phone]  4. Activity- up as tolerated, please walk at least 3 times per day.  Avoid strenuous activity, no heavy lifting  5. If any questions or concerns arise, please do not hesitate to contact our office at [phone]

## 2019-04-04 NOTE — Progress Notes (Addendum)
      Glenda Gomez [address]       [place] [id number]             [phone]      6 Days Post-Op Procedure(s) (LRB): XI ROBOTIC ASSISTED THORASCOPY- RIGHT UPPER LOBECTOMY (Right) Node Dissection Intercostal Nerve Block (Right)   Subjective:  No complaints.  She is ready go home.  + ambulation  + BM  Objective: Vital signs in last 24 hours: Temp:  [98 F (36.7 C)-98.5 F (36.9 C)] 98.3 F (36.8 C) (02/18 0354) Pulse Rate:  [61-64] 64 (02/18 0354) Cardiac Rhythm: Normal sinus rhythm (02/18 0354) Resp:  [15-22] 19 (02/18 0354) BP: (130-153)/(47-69) 140/47 (02/18 0354) SpO2:  [95 %-98 %] 95 % (02/18 0354)  Intake/Output from previous day: 02/17 0701 - 02/18 0700 In: 720 [P.O.:720] Out: -   General appearance: alert, cooperative and no distress Heart: regular rate and rhythm Lungs: clear to auscultation bilaterally Abdomen: soft, non-tender; bowel sounds normal; no masses,  no organomegaly Extremities: extremities normal, atraumatic, no cyanosis or edema Wound: clean and dry  Lab Results: No results for input(s): WBC, HGB, HCT, PLT in the last 72 hours. BMET:  Recent Labs    04/03/19 0230 04/04/19 0234  NA 140 138  K 3.6 3.7  CL 105 106  CO2 25 25  GLUCOSE 122* 101*  BUN 7* 7*  CREATININE 0.71 0.74  CALCIUM 8.6* 8.4*    PT/INR: No results for input(s): LABPROT, INR in the last 72 hours. ABG    Component Value Date/Time   PHART 7.396 03/30/2019 0450   HCO3 29.1 (H) 03/30/2019 0450   O2SAT 96.3 03/30/2019 0450   CBG (last 3)  No results for input(s): GLUCAP in the last 72 hours.  Assessment/Plan: S/P Procedure(s) (LRB): XI ROBOTIC ASSISTED THORASCOPY- RIGHT UPPER LOBECTOMY (Right) Node Dissection Intercostal Nerve Block (Right)  1. CV- no further A. Fib, continue Amiodarone, Maxide 2. Pulm -no acute issues, off oxygen, continue IS, home inhalers 3. Hypothyroidism- her t4 is mildly elevated, her synthroid has been decreased, she will need to  follow up with PCP 4. Dispo- patient stable, no further A. Fib, will d/c home today   LOS: 6 days    Ellwood Handler, PA-C  04/04/2019 Patient seen and examined, agree with above Will arrange f/u with Dr. Rogue Bussing after she sees me as an outpatient  Remo Lipps C. Roxan Hockey, MD [facility] [phone]

## 2019-04-04 NOTE — Progress Notes (Signed)
Removed PIV access and pt received discharge instructions. She understood it well. Changed previous chest tube area dressing. Patient took her all belongings. HS [employer]

## 2019-04-15 ENCOUNTER — Other Ambulatory Visit: Payer: Self-pay | Admitting: Thoracic Surgery (Cardiothoracic Vascular Surgery)

## 2019-04-15 DIAGNOSIS — C3411 Malignant neoplasm of upper lobe, right bronchus or lung: Secondary | ICD-10-CM

## 2019-04-16 ENCOUNTER — Ambulatory Visit
Admission: RE | Admit: 2019-04-16 | Discharge: 2019-04-16 | Disposition: A | Discharge status: Home / Self Care | Payer: Medicare HMO | Source: Ambulatory Visit | Attending: Thoracic Surgery (Cardiothoracic Vascular Surgery) | Admitting: Thoracic Surgery (Cardiothoracic Vascular Surgery)

## 2019-04-16 ENCOUNTER — Encounter: Payer: Self-pay | Admitting: Thoracic Surgery (Cardiothoracic Vascular Surgery)

## 2019-04-16 ENCOUNTER — Ambulatory Visit: Payer: Medicare HMO | Admitting: Thoracic Surgery (Cardiothoracic Vascular Surgery)

## 2019-04-16 ENCOUNTER — Other Ambulatory Visit: Payer: Self-pay

## 2019-04-16 ENCOUNTER — Ambulatory Visit (INDEPENDENT_AMBULATORY_CARE_PROVIDER_SITE_OTHER): Payer: Self-pay | Admitting: Thoracic Surgery (Cardiothoracic Vascular Surgery)

## 2019-04-16 VITALS — BP 136/83 | HR 68 | Temp 97.5°F | Resp 20 | Ht 62.5 in | Wt 245.0 lb

## 2019-04-16 DIAGNOSIS — C3491 Malignant neoplasm of unspecified part of right bronchus or lung: Secondary | ICD-10-CM

## 2019-04-16 DIAGNOSIS — C3411 Malignant neoplasm of upper lobe, right bronchus or lung: Secondary | ICD-10-CM

## 2019-04-16 MED ORDER — MUCINEX 600 MG PO TB12: 1200.0000 mg | ORAL_TABLET | Freq: Two times a day (BID) | ORAL | 1 refills | Status: DC

## 2019-04-16 NOTE — Progress Notes (Signed)
[address]       [place] [address]             [phone]       HPI: Glenda Gomez returns for a scheduled follow-up visit  Glenda Gomez is a 69 year old woman with a history of obesity, gastric bypass, fibromyalgia, reflux, hypertension, hypothyroidism, arthritis, myositis ossificans, and adenocarcinoma of the right upper lobe.  She had a relatively minor smoking history and quit back in 1995.  She was found to have a lung nodule about a year ago.  Over time that nodule gradually increased in size and had some mild activity on PET.  There also was some activity in the right hilar node.  Navigational bronchoscopy was positive for adenocarcinoma.  I did a robotic right upper lobectomy on 03/29/2019.  The nodule was a T1, N0, stage Ia lesion with 17 negative lymph nodes.  Postoperatively she had some transient atrial fibrillation.  She was discharged on amiodarone.  She went home on postoperative day #6.  She complains of a cough which she has had since the surgery.  She also has some pain in her right upper quadrant.  She has been taking tramadol for that.    Past Medical History:  Diagnosis Date  . Adenocarcinoma ([place])    right upper lobe  . Arthritis   . Chicken pox   . Dyspnea   . Environmental and seasonal allergies   . Fibromyalgia   . GERD (gastroesophageal reflux disease)   . Hemorrhoid   . Hypersomnia   . Hypertension   . Hypothyroidism   . Lung cancer ([place])   . Malignant neoplasm of right upper lobe of lung ([place]) 03/08/2019  . Myositis ossificans   . Osteoarthritis   . Thyroid disease     Current Outpatient Medications  Medication Sig Dispense Refill  . amiodarone (PACERONE) 200 MG tablet Take 1 tablet (200 mg total) by mouth every 12 (twelve) hours. For 5 days, then decrease to 200 mg daily 60 tablet 1  . celecoxib (CELEBREX) 200 MG capsule Take 1 capsule (200 mg total) by mouth 2 (two) times daily with a meal. (Patient taking differently: Take  200-400 mg by mouth daily. ) 60 capsule 1  . cyclobenzaprine (FLEXERIL) 10 MG tablet Take 10 mg by mouth 3 (three) times daily as needed for muscle spasms.     Marland Kitchen estradiol (ESTRACE) 0.5 MG tablet Take 0.5 mg by mouth daily.    . fluticasone (FLONASE) 50 MCG/ACT nasal spray Place 2 sprays into both nostrils daily as needed for allergies or rhinitis.     . furosemide (LASIX) 20 MG tablet Take 20 mg by mouth daily as needed for fluid or edema.     Marland Kitchen levothyroxine (SYNTHROID) 100 MCG tablet Take 1 tablet (100 mcg total) by mouth daily before breakfast. 30 tablet 1  . omeprazole (PRILOSEC) 20 MG capsule Take 20 mg by mouth 2 (two) times daily before a meal.     . traMADol (ULTRAM) 50 MG tablet Take 50 mg by mouth 2 (two) times daily as needed for moderate pain.     . traZODone (DESYREL) 50 MG tablet Take 50 mg by mouth at bedtime.    . triamterene-hydrochlorothiazide (MAXZIDE-25) 37.5-25 MG tablet Take 1 tablet by mouth daily.    . VENTOLIN HFA 108 (90 Base) MCG/ACT inhaler Inhale 2 puffs into the lungs every 6 (six) hours as needed for wheezing or shortness of breath.     Marland Kitchen  zolpidem (AMBIEN) 5 MG tablet Take 5-7.5 mg by mouth at bedtime.     Marland Kitchen guaiFENesin (MUCINEX) 600 MG 12 hr tablet Take 2 tablets (1,200 mg total) by mouth 2 (two) times daily. 14 tablet 1   No current facility-administered medications for this visit.    Physical Exam BP 136/83 (BP Location: Right Arm, Patient Position: Sitting, Cuff Size: Large)   Pulse 68   Temp (!) 97.5 F (36.4 C) (Temporal)   Resp 20   Ht 5' 2.5" (1.588 m)   Wt 245 lb (111.1 kg)   SpO2 96% Comment: RA  BMI 44.4 kg/m  69 year old woman in no acute distress Alert and oriented x3 with no focal deficits Lungs diminished at right base but otherwise clear Incisions healing well Cardiac regular rate and rhythm  Diagnostic Tests: CHEST - 2 VIEW  COMPARISON:  04/04/2019  FINDINGS: The small right apical pneumothorax noted on the prior study has  completely resolved. Heart size and vascularity are normal. Left lung is clear. Scarring in the right perihilar region. Surgical staples at the right apex. Tenting of the right hemidiaphragm is unchanged. No effusions. No acute bone abnormality.  IMPRESSION: No acute disease. Resolution of right apical pneumothorax. Postsurgical changes on the right.   Electronically Signed   By: Lorriane Shire M.D.   On: 04/16/2019 13:00 I personally reviewed the chest x-ray images and concur with the findings noted above.  Impression: Glenda Gomez is a 69 year old woman with a minimal history of tobacco abuse who was found to have a right upper lobe lung nodule.  This nodule increased in size over time and when biopsied was an adenocarcinoma.  She underwent a robotic right upper lobectomy on 03/29/2019.  She is about 3 weeks out from surgery and overall is doing well.  She does have a persistent cough.  She has some cough medication at home but has not been using it.  I told her she is okay to use any of the over-the-counter cough medications that she prefers.  I did give her a prescription for Mucinex that she feels like she has some congestion.  I explained to her that the pain in the right upper quadrant was normal.  She says that usually comes on in the evening after she has been up and around most of the day.  She is using tramadol for that.  I encouraged her to walk on a regular basis.  Advised her to wait about another week before trying to drive.  She has an appointment to see Dr. Rogue Bussing on Thursday.   Plan: She will see Dr. Rogue Bussing on Thursday. Return in 3 weeks to check on progress  [name], MD [facility] [phone]

## 2019-04-18 ENCOUNTER — Encounter: Payer: Self-pay | Admitting: *Deleted

## 2019-04-18 ENCOUNTER — Other Ambulatory Visit: Payer: Self-pay

## 2019-04-18 ENCOUNTER — Encounter: Payer: Self-pay | Admitting: Internal Medicine

## 2019-04-18 ENCOUNTER — Inpatient Hospital Stay: Payer: Medicare HMO | Attending: Internal Medicine | Admitting: Internal Medicine

## 2019-04-18 DIAGNOSIS — C3411 Malignant neoplasm of upper lobe, right bronchus or lung: Secondary | ICD-10-CM | POA: Diagnosis present

## 2019-04-18 DIAGNOSIS — E039 Hypothyroidism, unspecified: Secondary | ICD-10-CM | POA: Insufficient documentation

## 2019-04-18 DIAGNOSIS — I4891 Unspecified atrial fibrillation: Secondary | ICD-10-CM | POA: Insufficient documentation

## 2019-04-18 NOTE — Progress Notes (Signed)
[place] CONSULT NOTE  Patient Care Team: Tracie Harrier, MD as PCP - General (Internal Medicine) Telford Nab, RN as Oncology Nurse Navigator  CHIEF COMPLAINTS/PURPOSE OF CONSULTATION: lung cancer  #  Oncology History Overview Note  # JAN 2021- ADENOCA [Dr.A; Schlusser 2021-]DEC 2020- 13 mm lobular somewhat branching right upper lobe lesion is not hypermetabolic.Number of Lymph Nodes Examined: 17 ; Pathologic Stage Classification (pTNM, AJCC 8th Edition): pT1c, pN0 [Dr.Henderickson]; STAGE I-adenocarcinoma; no adjuvant therapy  # SURVIVORSHIP: P  # GENETICS: NA  DIAGNOSIS: lung cancer  STAGE:  I       ;  GOALS: cure  CURRENT/MOST RECENT THERAPY : Surveillance    Malignant neoplasm of right upper lobe of lung ([place])  03/08/2019 Initial Diagnosis   Malignant neoplasm of right upper lobe of lung ([place])   04/03/2019 Cancer Staging   Staging form: Lung, AJCC 8th Edition - Clinical stage from 04/03/2019: ycT1, cN0, cM0 - Signed by [name], MD on 04/03/2019      HISTORY OF PRESENTING ILLNESS:  Glenda Gomez 69 y.o.  female with recent diagnosis of stage I lung cancer is here for follow-up post operatively.  Patient recuperated fairly well after surgery.  As per patient-postoperative course was complicated by A. Fib-currently under control.  Not on anticoagulation.  No nausea no vomiting.  No headaches.   Review of Systems  Constitutional: Negative for chills, diaphoresis, fever, malaise/fatigue and weight loss.  HENT: Negative for nosebleeds and sore throat.   Eyes: Negative for double vision.  Respiratory: Positive for cough. Negative for hemoptysis, sputum production, shortness of breath and wheezing.   Cardiovascular: Negative for chest pain, palpitations, orthopnea and leg swelling.  Gastrointestinal: Negative for abdominal pain, blood in stool, constipation, diarrhea, heartburn, melena, nausea and vomiting.  Genitourinary: Negative for  dysuria, frequency and urgency.  Musculoskeletal: Positive for joint pain. Negative for back pain.  Skin: Negative.  Negative for itching and rash.  Neurological: Negative for dizziness, tingling, focal weakness, weakness and headaches.  Endo/Heme/Allergies: Does not bruise/bleed easily.  Psychiatric/Behavioral: Negative for depression. The patient is not nervous/anxious and does not have insomnia.      MEDICAL HISTORY:  Past Medical History:  Diagnosis Date  . 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 Arthritis   . Chicken pox   . Dyspnea   . Environmental and seasonal allergies   . Fibromyalgia   . GERD (gastroesophageal reflux disease)   . Hemorrhoid   . Hypersomnia   . Hypertension   . Hypothyroidism   . Lung cancer ([place])   . Malignant neoplasm of right upper lobe of lung ([place]) 03/08/2019  . Myositis ossificans   . Osteoarthritis   . Thyroid disease     SURGICAL HISTORY: Past Surgical History:  Procedure Laterality Date  .  bengin tongue lesion     Benign  . ABDOMINAL HYSTERECTOMY    . BACK SURGERY    . bladder tack    . BREAST BIOPSY Left    neg  . CHOLECYSTECTOMY    . COLONOSCOPY WITH PROPOFOL N/A 03/24/2017   Procedure: COLONOSCOPY WITH PROPOFOL;  Surgeon: Lollie Sails, MD;  Location: [facility] ENDOSCOPY;  Service: Endoscopy;  Laterality: N/A;  . ESOPHAGOGASTRODUODENOSCOPY (EGD) WITH PROPOFOL N/A 03/24/2017   Procedure: ESOPHAGOGASTRODUODENOSCOPY (EGD) WITH PROPOFOL;  Surgeon: Lollie Sails, MD;  Location: [facility] ENDOSCOPY;  Service: Endoscopy;  Laterality: N/A;  . GASTRIC BYPASS OPEN    . hemrrhoidectomy    .  INTERCOSTAL NERVE BLOCK Right 03/29/2019   Procedure: Intercostal Nerve Block;  Surgeon: [name], MD;  Location: [place];  Service: Thoracic;  Laterality: Right;  . JOINT REPLACEMENT Bilateral    TKR  . KNEE ARTHROPLASTY Right 07/06/2016   Procedure: COMPUTER ASSISTED TOTAL KNEE ARTHROPLASTY;  Surgeon: Dereck Leep, MD;  Location: [facility]  ORS;  Service: Orthopedics;  Laterality: Right;  . KNEE ARTHROSCOPY    . LOBECTOMY Right 03/29/2019   XI ROBOTIC ASSISTED THORASCOPY- RIGHT UPPER LOBECTOMY (Right Chest)  . NODE DISSECTION  03/29/2019   Procedure: Node Dissection;  Surgeon: [name], MD;  Location: [place];  Service: Thoracic;;  . plantar fascia Left   . SHOULDER ARTHROSCOPY WITH DEBRIDEMENT AND BICEP TENDON REPAIR Left 09/25/2018   Procedure: SHOULDER ARTHROSCOPY WITH DEBRIDEMENT DECOMPRESSION AND BICEP TENDON REPAIR - SCRUB TECH;  Surgeon: Corky Mull, MD;  Location: [facility] ORS;  Service: Orthopedics;  Laterality: Left;  . TONSILLECTOMY    . VIDEO BRONCHOSCOPY WITH ENDOBRONCHIAL NAVIGATION N/A 03/04/2019   Procedure: VIDEO BRONCHOSCOPY WITH ENDOBRONCHIAL NAVIGATION;  Surgeon: Ottie Glazier, MD;  Location: [facility] ORS;  Service: Thoracic;  Laterality: N/A;  . VIDEO BRONCHOSCOPY WITH ENDOBRONCHIAL ULTRASOUND N/A 03/04/2019   Procedure: VIDEO BRONCHOSCOPY WITH ENDOBRONCHIAL ULTRASOUND;  Surgeon: Ottie Glazier, MD;  Location: [facility] ORS;  Service: Thoracic;  Laterality: N/A;    SOCIAL HISTORY: Social History   Socioeconomic History  . Marital status: Divorced    Spouse name: Not on file  . Number of children: Not on file  . Years of education: Not on file  . Highest education level: Not on file  Occupational History  . Not on file  Tobacco Use  . Smoking status: Former Smoker    Packs/day: 2.00    Years: 10.00    Pack years: 20.00    Quit date: 07/17/1993    Years since quitting: 25.7  . Smokeless tobacco: Never Used  Substance and Sexual Activity  . Alcohol use: No  . Drug use: No  . Sexual activity: Not on file  Other Topics Concern  . Not on file  Social History Narrative   1ppd- quit > 20 years ago; no alcohol; in [place]; lives self. retd- cook.    Social Determinants of Health   Financial Resource Strain:   . Difficulty of Paying Living Expenses: Not on file  Food Insecurity:   . Worried About  Charity fundraiser in the Last Year: Not on file  . Ran Out of Food in the Last Year: Not on file  Transportation Needs:   . Lack of Transportation (Medical): Not on file  . Lack of Transportation (Non-Medical): Not on file  Physical Activity:   . Days of Exercise per Week: Not on file  . Minutes of Exercise per Session: Not on file  Stress:   . Feeling of Stress : Not on file  Social Connections:   . Frequency of Communication with Friends and Family: Not on file  . Frequency of Social Gatherings with Friends and Family: Not on file  . Attends Religious Services: Not on file  . Active Member of Clubs or Organizations: Not on file  . Attends Archivist Meetings: Not on file  . Marital Status: Not on file  Intimate Partner Violence:   . Fear of Current or Ex-Partner: Not on file  . Emotionally Abused: Not on file  . Physically Abused: Not on file  . Sexually Abused: Not on file  FAMILY HISTORY: Family History  Problem Relation Age of Onset  . Breast cancer Sister 40  . Breast cancer Maternal Aunt 80  . Lung cancer Maternal Aunt   . Breast cancer Sister 34  . Alzheimer's disease Mother   . Arthritis Mother   . Diabetes Mother   . Heart failure Brother   . Pancreatic cancer Brother 69  . Lung cancer Brother   . Liver cancer Father     ALLERGIES:  is allergic to flagyl [metronidazole] and pregabalin.  MEDICATIONS:  Current Outpatient Medications  Medication Sig Dispense Refill  . amiodarone (PACERONE) 200 MG tablet Take 1 tablet (200 mg total) by mouth every 12 (twelve) hours. For 5 days, then decrease to 200 mg daily 60 tablet 1  . celecoxib (CELEBREX) 200 MG capsule Take 1 capsule (200 mg total) by mouth 2 (two) times daily with a meal. (Patient taking differently: Take 200-400 mg by mouth daily. ) 60 capsule 1  . cyclobenzaprine (FLEXERIL) 10 MG tablet Take 10 mg by mouth 3 (three) times daily as needed for muscle spasms.     Marland Kitchen estradiol (ESTRACE) 0.5 MG  tablet Take 0.5 mg by mouth daily.    . fluticasone (FLONASE) 50 MCG/ACT nasal spray Place 2 sprays into both nostrils daily as needed for allergies or rhinitis.     . furosemide (LASIX) 20 MG tablet Take 20 mg by mouth daily as needed for fluid or edema.     Marland Kitchen guaiFENesin (MUCINEX) 600 MG 12 hr tablet Take 2 tablets (1,200 mg total) by mouth 2 (two) times daily. 14 tablet 1  . levothyroxine (SYNTHROID) 100 MCG tablet Take 1 tablet (100 mcg total) by mouth daily before breakfast. 30 tablet 1  . omeprazole (PRILOSEC) 20 MG capsule Take 20 mg by mouth 2 (two) times daily before a meal.     . traMADol (ULTRAM) 50 MG tablet Take 50 mg by mouth 2 (two) times daily as needed for moderate pain.     . traZODone (DESYREL) 50 MG tablet Take 50 mg by mouth at bedtime.    . triamterene-hydrochlorothiazide (MAXZIDE-25) 37.5-25 MG tablet Take 1 tablet by mouth daily.    . VENTOLIN HFA 108 (90 Base) MCG/ACT inhaler Inhale 2 puffs into the lungs every 6 (six) hours as needed for wheezing or shortness of breath.     . zolpidem (AMBIEN) 5 MG tablet Take 5-7.5 mg by mouth at bedtime.      No current facility-administered medications for this visit.      Marland Kitchen  PHYSICAL EXAMINATION: ECOG PERFORMANCE STATUS: 0 - Asymptomatic  Vitals:   04/18/19 1147  BP: 118/76  Pulse: 90  Temp: 97.9 F (36.6 C)   Filed Weights   04/18/19 1147  Weight: 244 lb 8 oz (110.9 kg)    Physical Exam  Constitutional: She is oriented to person, place, and time and well-developed, well-nourished, and in no distress.  Obese. She is walking herself.  HENT:  Head: Normocephalic and atraumatic.  Mouth/Throat: Oropharynx is clear and moist. No oropharyngeal exudate.  Eyes: Pupils are equal, round, and reactive to light.  Cardiovascular: Normal rate and regular rhythm.  Pulmonary/Chest: Effort normal and breath sounds normal. No respiratory distress. She has no wheezes.  Abdominal: Soft. Bowel sounds are normal. She exhibits no  distension and no mass. There is no abdominal tenderness. There is no rebound and no guarding.  Musculoskeletal:        General: No tenderness or edema. Normal range of motion.  Cervical back: Normal range of motion and neck supple.  Neurological: She is alert and oriented to person, place, and time.  Skin: Skin is warm.  Psychiatric: Affect normal.   LABORATORY DATA:  I have reviewed the data as listed Lab Results  Component Value Date   WBC 9.2 04/01/2019   HGB 11.8 (L) 04/01/2019   HCT 38.1 04/01/2019   MCV 87.2 04/01/2019   PLT 269 04/01/2019   Recent Labs    03/27/19 1023 03/30/19 0440 03/31/19 0541 03/31/19 0541 04/01/19 0156 04/03/19 0230 04/04/19 0234  NA 137   < > 138   < > 139 140 138  K 3.9   < > 3.9   < > 4.1 3.6 3.7  CL 102   < > 104   < > 105 105 106  CO2 23   < > 27   < > 27 25 25   GLUCOSE 108*   < > 93   < > 101* 122* 101*  BUN 9   < > 12   < > 11 7* 7*  CREATININE 0.71   < > 0.96   < > 0.73 0.71 0.74  CALCIUM 9.4   < > 8.2*   < > 8.2* 8.6* 8.4*  GFRNONAA >60   < > >60   < > >60 >60 >60  GFRAA >60   < > >60   < > >60 >60 >60  PROT 6.9  --  5.3*  --   --   --   --   ALBUMIN 3.7  --  2.4*  --   --   --   --   AST 21  --  19  --   --   --   --   ALT 15  --  13  --   --   --   --   ALKPHOS 78  --  58  --   --   --   --   BILITOT 0.7  --  0.6  --   --   --   --    < > = values in this interval not displayed.    RADIOGRAPHIC STUDIES: I have personally reviewed the radiological images as listed and agreed with the findings in the report. DG Chest 2 View  Result Date: 04/16/2019 CLINICAL DATA:  Shortness of breath. Status post right upper lobectomy for lung cancer on 03/29/2019. EXAM: CHEST - 2 VIEW COMPARISON:  04/04/2019 FINDINGS: The small right apical pneumothorax noted on the prior study has completely resolved. Heart size and vascularity are normal. Left lung is clear. Scarring in the right perihilar region. Surgical staples at the right apex. Tenting  of the right hemidiaphragm is unchanged. No effusions. No acute bone abnormality. IMPRESSION: No acute disease. Resolution of right apical pneumothorax. Postsurgical changes on the right. Electronically Signed   By: Lorriane Shire M.D.   On: 04/16/2019 13:00   DG Chest 2 View  Result Date: 04/04/2019 CLINICAL DATA:  Status post lobectomy EXAM: CHEST - 2 VIEW COMPARISON:  04/03/2019 FINDINGS: Postoperative changes on the right. Trace residual right apical pneumothorax. Small right pleural effusion. Left lung clear. Heart is normal size. IMPRESSION: Stable trace right apical pneumothorax and postoperative changes. Small right pleural effusion Electronically Signed   By: Rolm Baptise M.D.   On: 04/04/2019 09:42   DG Chest 2 View  Result Date: 03/27/2019 CLINICAL DATA:  Shortness of breath. History of lung carcinoma. EXAM: CHEST - 2 VIEW  COMPARISON:  March 04, 2019. FINDINGS: The heart size and mediastinal contours are within normal limits. Left lung is clear. No pneumothorax or pleural effusion is noted. Ill-defined density is seen in right upper lobe which may correspond to lung mass described on prior CT scan. The visualized skeletal structures are unremarkable. IMPRESSION: Ill-defined density seen in right upper lobe which may correspond to lung mass described on prior CT scan. No other abnormality seen in the chest. Electronically Signed   By: Marijo Conception M.D.   On: 03/27/2019 15:26   NM PET Image Restag (PS) Skull Base To Thigh  Result Date: 03/20/2019 CLINICAL DATA:  Subsequent treatment strategy for non-small cell lung cancer of the right upper lobe. EXAM: NUCLEAR MEDICINE PET SKULL BASE TO THIGH TECHNIQUE: 13.1 mCi F-18 FDG was injected intravenously. Full-ring PET imaging was performed from the skull base to thigh after the radiotracer. CT data was obtained and used for attenuation correction and anatomic localization. Fasting blood glucose: 101 mg/dl COMPARISON:  Multiple exams, including CT  chest 03/04/2019 and PET-CT from 02/02/2018 FINDINGS: Mediastinal blood pool activity: SUV max No significant abnormal hypermetabolic activity in this region. Liver activity: SUV max NA NECK: No significant abnormal hypermetabolic activity in this region. Incidental CT findings: Bilateral common carotid atherosclerotic calcification. CHEST: Since 03/04/2019, there has been significant medial expansion of the right upper lobe nodule, with a oval-shaped expansion extending towards the hilum and associated bronchus. In total the nodule measures 2.4 by 1.8 cm on image 89/3 although much of this represents the new expansion. Centered over the original portion of the nodule, maximum SUV is 2.6, previously 1.8. A small right hilar node has maximum SUV of 3.8. No new nodule is identified. Incidental CT findings: Mild cardiomegaly. Coronary, aortic arch, and branch vessel atherosclerotic vascular disease. ABDOMEN/PELVIS: No significant abnormal hypermetabolic activity in this region. Incidental CT findings: Aortoiliac atherosclerotic vascular disease. SKELETON: No significant abnormal hypermetabolic activity in this region. Incidental CT findings: none IMPRESSION: 1. The right upper lobe nodule has a maximum SUV of 2.6, formerly 1.8. Currently this is considered somewhat worrisome for low-grade malignancy. On the other hand, there is been significant interval expansion medially from this nodule compared to 03/04/2019, favoring mucous plugging in the subtending bronchus given the rapid progression. Such plugging could certainly occur in the setting of a tumor, or could be inflammatory. If percutaneous or bronchoscopic tissue sampling is performed, I would recommend targeting the more lateral portion of this process which corresponds to the original nodule. 2. Subtly accentuated small right hilar lymph node, maximum SUV 3.8, nonspecific. 3. Other imaging findings of potential clinical significance: Aortic Atherosclerosis  (ICD10-I70.0). Coronary atherosclerosis. Mild cardiomegaly. Electronically Signed   By: Van Clines M.D.   On: 03/20/2019 16:45   DG Chest 1V REPEAT Same Day  Result Date: 04/03/2019 CLINICAL DATA:  Chest tube removal EXAM: CHEST - 1 VIEW SAME DAY COMPARISON:  04/03/2019 FINDINGS: There is interval removal of the right-sided chest tube. There is a trace right apical pneumothorax. There is no focal consolidation. There is no pleural effusion. The heart and mediastinal contours are unremarkable. There is no acute osseous abnormality. IMPRESSION: Interval removal of the right-sided chest tube. Trace right apical pneumothorax. Electronically Signed   By: Kathreen Devoid   On: 04/03/2019 11:18   DG Chest Port 1 View  Result Date: 04/03/2019 CLINICAL DATA:  Follow-up chest tube EXAM: PORTABLE CHEST 1 VIEW COMPARISON:  04/02/2019 FINDINGS: Cardiac shadow is stable. Aortic calcifications are again  seen. Left lung is clear. Right-sided chest tube is seen with stable apical pneumothorax unchanged from the prior exam. No other focal abnormality is noted. IMPRESSION: Stable right apical pneumothorax unchanged from the previous exam. Electronically Signed   By: Inez Catalina M.D.   On: 04/03/2019 09:31   DG CHEST PORT 1 VIEW  Result Date: 04/02/2019 CLINICAL DATA:  Chest tube, shortness of breath EXAM: PORTABLE CHEST 1 VIEW COMPARISON:  04/01/2019 FINDINGS: Right chest tube remains in place. Stable persistent small right apical pneumothorax. Cardiomegaly, low lung volumes. Right base atelectasis. No focal opacity on the left. No visible effusions or acute bony abnormality. IMPRESSION: Stable position of the right chest tube with small residual right apical pneumothorax, unchanged. Right base atelectasis. Electronically Signed   By: Rolm Baptise M.D.   On: 04/02/2019 08:44   DG CHEST PORT 1 VIEW  Result Date: 04/01/2019 CLINICAL DATA:  Follow-up chest tube EXAM: PORTABLE CHEST 1 VIEW COMPARISON:  03/31/2019  FINDINGS: Right-sided thoracostomy catheter is again noted. The jugular line has been removed in the interval. Persistent small right apical pneumothorax is seen. Postsurgical changes are again noted. Minimal atelectatic changes are again seen on the left. No new focal abnormality is seen. IMPRESSION: Overall stable appearance of the chest with residual right pneumothorax. Electronically Signed   By: Inez Catalina M.D.   On: 04/01/2019 08:08   DG CHEST PORT 1 VIEW  Result Date: 03/31/2019 CLINICAL DATA:  Follow-up pneumothorax. EXAM: PORTABLE CHEST 1 VIEW COMPARISON:  03/30/2019 FINDINGS: Unchanged position of right chest tube. The right-sided pneumothorax has decreased in volume from previous exam. Along the apex the pneumothorax measures 1.3 cm. On the previous exam this measured 4.7 cm. IMPRESSION: Interval decrease in volume of right-sided pneumothorax. Electronically Signed   By: Kerby Moors M.D.   On: 03/31/2019 09:22   DG CHEST PORT 1 VIEW  Result Date: 03/30/2019 CLINICAL DATA:  Status post right upper lobectomy. EXAM: PORTABLE CHEST 1 VIEW COMPARISON:  03/29/2019 FINDINGS: Sequelae of right upper lobectomy are again identified. A right jugular catheter terminates over the lower SVC. A right chest tube is unchanged. The cardiomediastinal silhouette is unchanged. A right-sided pneumothorax has enlarged and is now moderate in size. The left lung is grossly clear. No sizable pleural effusion is identified. IMPRESSION: Enlarging right pneumothorax, now moderate in size. Electronically Signed   By: Logan Bores M.D.   On: 03/30/2019 10:10   DG Chest Port 1 View  Result Date: 03/29/2019 CLINICAL DATA:  Status post right upper lobectomy for adenocarcinoma. EXAM: PORTABLE CHEST 1 VIEW COMPARISON:  Chest x-ray dated 03/27/2019 FINDINGS: Right chest tube in place. Small right apical pneumothorax with consolidation of the adjacent portion of the left lower lobe. Right central venous catheter tip is at the  cavoatrial junction. Left lung is clear. Heart size and vascularity are normal. Aortic atherosclerosis. IMPRESSION: Small right apical pneumothorax. Consolidation of the adjacent portion of the left lower lobe. Electronically Signed   By: Lorriane Shire M.D.   On: 03/29/2019 12:51    ASSESSMENT & PLAN:   Malignant neoplasm of right upper lobe of lung ([place]) # RIGHT UPPER LOBE LUNG CA-stage I-status post resection clear margins.  Discussed patient overall good prognosis; and would not recommend any adjuvant therapy.  I reviewed the pathology and staging with the patient family in detail.  #Discussed that unfortunately patients with lung cancers-are at risk of recurrence; also at risk for development of second primaries.  I would recommend imaging with  a CT scan every 6 months for the first 2 to 3 years; and then annual imaging for a total of 5 years.   #A. Fib-postoperatively current resolved.  Not on anticoagulation.  # DISPOSITION: # follow up in 6 months; MD; labs- cbc/cmp;CT chest prior- Dr.B    All questions were answered. The patient knows to call the clinic with any problems, questions or concerns.    Cammie Sickle, MD 04/19/2019 7:39 AM

## 2019-04-18 NOTE — Assessment & Plan Note (Addendum)
#   RIGHT UPPER LOBE LUNG CA-stage I-status post resection clear margins.  Discussed patient overall good prognosis; and would not recommend any adjuvant therapy.  I reviewed the pathology and staging with the patient family in detail.  #Discussed that unfortunately patients with lung cancers-are at risk of recurrence; also at risk for development of second primaries.  I would recommend imaging with a CT scan every 6 months for the first 2 to 3 years; and then annual imaging for a total of 5 years.   #A. Fib-postoperatively current resolved.  Not on anticoagulation.  # DISPOSITION: # follow up in 6 months; MD; labs- cbc/cmp;CT chest prior- Dr.B

## 2019-04-18 NOTE — Progress Notes (Signed)
  Oncology Nurse Navigator Documentation  Navigator Location: [facility]-Med Onc (04/18/19 1200)   )Navigator Encounter Type: Follow-up Appt (04/18/19 1200)                     Patient Visit Type: MedOnc (04/18/19 1200) Treatment Phase: Post-Tx Follow-up (04/18/19 1200) Barriers/Navigation Needs: No Barriers At This Time (04/18/19 1200)   Interventions: None Required (04/18/19 1200)               met with patient after follow up visit with Dr. Rogue Bussing. Reviewed upcoming appts. Instructed to call with any further questions or needs. Pt verbalized understanding. Nothing further needed.        Time Spent with Patient: 30 (04/18/19 1200)

## 2019-04-30 DIAGNOSIS — I480 Paroxysmal atrial fibrillation: Secondary | ICD-10-CM | POA: Insufficient documentation

## 2019-05-02 ENCOUNTER — Other Ambulatory Visit: Payer: Self-pay | Admitting: Thoracic Surgery (Cardiothoracic Vascular Surgery)

## 2019-05-02 DIAGNOSIS — C3411 Malignant neoplasm of upper lobe, right bronchus or lung: Secondary | ICD-10-CM

## 2019-05-07 ENCOUNTER — Ambulatory Visit
Admission: RE | Admit: 2019-05-07 | Discharge: 2019-05-07 | Disposition: A | Discharge status: Home / Self Care | Payer: Medicare HMO | Source: Ambulatory Visit | Attending: Thoracic Surgery (Cardiothoracic Vascular Surgery) | Admitting: Thoracic Surgery (Cardiothoracic Vascular Surgery)

## 2019-05-07 ENCOUNTER — Encounter: Payer: Self-pay | Admitting: Thoracic Surgery (Cardiothoracic Vascular Surgery)

## 2019-05-07 ENCOUNTER — Ambulatory Visit (INDEPENDENT_AMBULATORY_CARE_PROVIDER_SITE_OTHER): Payer: Self-pay | Admitting: Thoracic Surgery (Cardiothoracic Vascular Surgery)

## 2019-05-07 ENCOUNTER — Other Ambulatory Visit: Payer: Self-pay

## 2019-05-07 VITALS — BP 150/83 | HR 67 | Temp 97.8°F | Resp 18 | Ht 62.5 in | Wt 244.0 lb

## 2019-05-07 DIAGNOSIS — C3411 Malignant neoplasm of upper lobe, right bronchus or lung: Secondary | ICD-10-CM

## 2019-05-07 DIAGNOSIS — Z902 Acquired absence of lung [part of]: Secondary | ICD-10-CM

## 2019-05-07 NOTE — Progress Notes (Signed)
[address]       [place] [address]             [phone]      HPI: Ms. 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  She had about a 20-pack-year history of smoking before quitting in 1995.  She was found to have a lung nodule about a year ago.  That gradually increased in size over time.  It was mildly active on PET.    I did a robotic right upper lobectomy on 03/29/2019 and it turned out to be a T1, N0, stage Ia adenocarcinoma.  She had some atrial fibrillation postoperatively and went home on amiodarone.  I saw in the office on 04/16/2019.  She was doing well at that time but was still taking some tramadol for pain and also complained of a cough.  In the interim since her last visit she is feeling better.  She saw Dr. Rogue Bussing, no adjuvant therapy is needed.  She does continue to have some pain and occasionally uses tramadol.  Her cough is improved.  Past Medical History:  Diagnosis Date  . Adenocarcinoma ([place])    right upper lobe  . Arthritis   . Chicken pox   . Dyspnea   . Environmental and seasonal allergies   . Fibromyalgia   . GERD (gastroesophageal reflux disease)   . Hemorrhoid   . Hypersomnia   . Hypertension   . Hypothyroidism   . Lung cancer ([place])   . Malignant neoplasm of right upper lobe of lung ([place]) 03/08/2019  . Myositis ossificans   . Osteoarthritis   . Thyroid disease     Current Outpatient Medications  Medication Sig Dispense Refill  . celecoxib (CELEBREX) 200 MG capsule Take 1 capsule (200 mg total) by mouth 2 (two) times daily with a meal. (Patient taking differently: Take 200-400 mg by mouth daily. ) 60 capsule 1  . cyclobenzaprine (FLEXERIL) 10 MG tablet Take 10 mg by mouth 3 (three) times daily as needed for  muscle spasms.     Marland Kitchen estradiol (ESTRACE) 0.5 MG tablet Take 0.5 mg by mouth daily.    . fluticasone (FLONASE) 50 MCG/ACT nasal spray Place 2 sprays into both nostrils daily as needed for allergies or rhinitis.     . furosemide (LASIX) 20 MG tablet Take 20 mg by mouth daily as needed for fluid or edema.     Marland Kitchen levothyroxine (SYNTHROID) 100 MCG tablet Take 1 tablet (100 mcg total) by mouth daily before breakfast. 30 tablet 1  . omeprazole (PRILOSEC) 20 MG capsule Take 20 mg by mouth 2 (two) times daily before a meal.     . traMADol (ULTRAM) 50 MG tablet Take 50 mg by mouth 2 (two) times daily as needed for moderate pain.     . traZODone (DESYREL) 50 MG tablet Take 50 mg by mouth at bedtime.    . triamterene-hydrochlorothiazide (MAXZIDE-25) 37.5-25 MG tablet Take 1 tablet by mouth daily.    . VENTOLIN HFA 108 (90 Base) MCG/ACT inhaler Inhale 2 puffs into the lungs every 6 (six) hours as needed for wheezing or shortness of breath.     . zolpidem (AMBIEN) 5 MG tablet Take 5-7.5 mg by mouth at bedtime.      No current facility-administered medications for this  visit.    Physical Exam BP (!) 150/83 (BP Location: Left Arm, Patient Position: Sitting, Cuff Size: Large)   Pulse 67   Temp 97.8 F (36.6 C)   Resp 18   Ht 5' 2.5" (1.588 m)   Wt 244 lb (110.7 kg)   SpO2 97% Comment: RA  BMI 43.29 kg/m  69 year old woman in no acute distress Alert and oriented x3 with no focal deficits Incisions well-healed Lungs diminished at right base but otherwise clear Cardiac regular rate and rhythm   Diagnostic Tests: CHEST - 2 VIEW  COMPARISON:  04/16/2019  FINDINGS: Cardiac shadow is stable. Perihilar scarring is noted consistent with the known history of lobectomy. Mild volume loss on the right is noted and stable. No focal infiltrate or pneumothorax is noted. Aortic calcifications are seen. No bony abnormality is noted.  IMPRESSION: Postsurgical change with volume loss on the right stable  from the prior exam. No new focal abnormality is seen.   Electronically Signed   By: Inez Catalina M.D.   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  She is now about 6 weeks out from surgery.  She does still have some discomfort and is using tramadol once every few days.  She is taking Celebrex.  She complains of some shortness of breath with exertion.  That is typical particularly in the early postoperative timeframe.  Weight loss will be key for her in the long run.  She had atrial fibrillation in early postoperative period.  No signs or symptoms since discharge.  I think she can stop the amiodarone once her current prescription runs out.  She will follow up with Dr. Rogue Bussing every 6 months for the first 2 to 3 years and then annually after that.  I we will plan to see her back in a year to check on her progress.  She knows to call anytime if she needs me before that.  Plan:  Stop amiodarone after current prescription complete Follow-up as scheduled with Dr. Rogue Bussing Return in 1 year  [name], MD [facility] [phone]

## 2019-06-22 ENCOUNTER — Ambulatory Visit: Payer: Medicare HMO

## 2019-08-13 ENCOUNTER — Telehealth: Payer: Self-pay

## 2019-08-13 DIAGNOSIS — C3411 Malignant neoplasm of upper lobe, right bronchus or lung: Secondary | ICD-10-CM

## 2019-08-13 NOTE — Telephone Encounter (Signed)
Survivorship Care Plan visit completed.  Treatment summary reviewed and mailed to patient.  ASCO answers booklet reviewed and mailed to patient.  CARE program and Cancer Transitions discussed with patient along with other resources cancer center offers to patients and caregivers.  Patient verbalized understanding.  SCP packet mailed.  Patient in agreement for APP to have a Virtual visit to introduce them to the Survivorship Clinic.  Encouraged patient to call for any questions or concerns. 

## 2019-08-21 ENCOUNTER — Inpatient Hospital Stay: Payer: Medicare HMO | Attending: Oncology | Admitting: Oncology

## 2019-08-21 DIAGNOSIS — C3411 Malignant neoplasm of upper lobe, right bronchus or lung: Secondary | ICD-10-CM

## 2019-08-21 NOTE — Progress Notes (Signed)
Virtual Visit Progress Note  Survivorship Clinic Consult note [facility]  [phone] [phone]  Patient Care Team: Tracie Harrier, MD as PCP - General (Internal Medicine) Telford Nab, RN as Oncology Nurse Navigator [name], MD as Consulting Physician (Cardiothoracic Surgery) Cammie Sickle, MD as Consulting Physician (Internal Medicine) Nestor Lewandowsky, MD as Referring Physician (Cardiothoracic Surgery)   Name of the patient: Glenda Gomez  [id number]  09/23/1950   Date of visit: 08/21/19  CLINIC:  Survivorship  I connected with Glenda Gomez on 08/21/19 at 11:00 AM EDT by video enabled telemedicine visit and verified that I am speaking with the correct person using two identifiers.   I discussed the limitations, risks, security and privacy concerns of performing an evaluation and management service by telemedicine and the availability of in-person appointments. I also discussed with the patient that there may be a patient responsible charge related to this service. The patient expressed understanding and agreed to proceed.   Other persons participating in the visit and their role in the encounter: Magdalene Patricia, RN (review Survivorship Care Plan)  Patient's location: home Provider's location: clinic  Chief Complaint: Review Survivorship Care Plan and address acute survivorship needs  REASON FOR VISIT:  Survivorship Care Plan visit & to address acute survivorship needs   BRIEF ONCOLOGY HISTORY: Oncology History Overview Note  # JAN 2021- ADENOCA [Dr.A; [place] 2021-]DEC 2020- 13 mm lobular somewhat branching right upper lobe lesion is not hypermetabolic.Number of Lymph Nodes Examined: 17 ; Pathologic Stage Classification (pTNM, AJCC 8th Edition): pT1c, pN0 [Dr.Henderickson]; STAGE I-adenocarcinoma; no adjuvant therapy  # SURVIVORSHIP: P  # GENETICS: NA  DIAGNOSIS: lung cancer  STAGE:  I       ;  GOALS:  cure  CURRENT/MOST RECENT THERAPY : Surveillance    Malignant neoplasm of right upper lobe of lung ([place])  03/08/2019 Initial Diagnosis   Malignant neoplasm of right upper lobe of lung ([place])   04/03/2019 Cancer Staging   Staging form: Lung, AJCC 8th Edition - Clinical stage from 04/03/2019: ycT1, cN0, cM0 - Signed by [name], MD on 04/03/2019     INTERVAL HISTORY: Patient presents to the survivorship clinic today for initial meeting to review survivorship care plan detailing treatment course for lung cancer, as well as monitoring long-term side effects of that treatment, education regarding health maintenance, screening, and overall wellness and health promotion.  Overall, she reports feeling stable since completing treatment.  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  She reports she is chronically anxious due to comorbid conditions.  She is extremely tired.  She was recently started on Wellbutrin which she believes has helped some.   Past medical, surgical, social history were reviewed and updated as below.  We also updated patient's current medications and allergies.  ADDITIONAL REVIEW OF SYSTEMS:  Review of Systems  Constitutional: Positive for malaise/fatigue. Negative for chills, fever and weight loss.  HENT: Negative for congestion, ear pain and tinnitus.   Eyes: Negative.  Negative for blurred vision and double vision.  Respiratory: Positive for shortness of breath. Negative for cough and sputum production.   Cardiovascular: Negative.  Negative for chest pain, palpitations and leg swelling.  Gastrointestinal: Negative.  Negative for abdominal pain, constipation, diarrhea, nausea and vomiting.  Genitourinary: Negative for dysuria, frequency and urgency.  Musculoskeletal: Negative for  back pain and falls.  Skin:  Negative.  Negative for rash.  Neurological: Positive for weakness. Negative for headaches.  Endo/Heme/Allergies: Negative.  Does not bruise/bleed easily.       Night sweats  Psychiatric/Behavioral: Positive for depression. The patient is nervous/anxious. The patient does not have insomnia.     PAST MEDICAL & SURGICAL HISTORY:  Past Medical History:  Diagnosis Date  . Adenocarcinoma ([place])    right upper lobe  . Arthritis   . Chicken pox   . Dyspnea   . Environmental and seasonal allergies   . Fibromyalgia   . GERD (gastroesophageal reflux disease)   . Hemorrhoid   . Hypersomnia   . Hypertension   . Hypothyroidism   . Lung cancer ([place])   . Malignant neoplasm of right upper lobe of lung ([place]) 03/08/2019  . Myositis ossificans   . Osteoarthritis   . Thyroid disease    Past Surgical History:  Procedure Laterality Date  .  bengin tongue lesion     Benign  . ABDOMINAL HYSTERECTOMY    . BACK SURGERY    . bladder tack    . BREAST BIOPSY Left    neg  . CHOLECYSTECTOMY    . COLONOSCOPY WITH PROPOFOL N/A 03/24/2017   Procedure: COLONOSCOPY WITH PROPOFOL;  Surgeon: Lollie Sails, MD;  Location: [facility] ENDOSCOPY;  Service: Endoscopy;  Laterality: N/A;  . ESOPHAGOGASTRODUODENOSCOPY (EGD) WITH PROPOFOL N/A 03/24/2017   Procedure: ESOPHAGOGASTRODUODENOSCOPY (EGD) WITH PROPOFOL;  Surgeon: Lollie Sails, MD;  Location: [facility] ENDOSCOPY;  Service: Endoscopy;  Laterality: N/A;  . GASTRIC BYPASS OPEN    . hemrrhoidectomy    . INTERCOSTAL NERVE BLOCK Right 03/29/2019   Procedure: Intercostal Nerve Block;  Surgeon: [name], MD;  Location: [facility];  Service: Thoracic;  Laterality: Right;  . JOINT REPLACEMENT Bilateral    TKR  . KNEE ARTHROPLASTY Right 07/06/2016   Procedure: COMPUTER ASSISTED TOTAL KNEE ARTHROPLASTY;  Surgeon: Dereck Leep, MD;  Location: [facility] ORS;  Service: Orthopedics;  Laterality: Right;  . KNEE ARTHROSCOPY    . LOBECTOMY Right 03/29/2019   XI ROBOTIC  ASSISTED THORASCOPY- RIGHT UPPER LOBECTOMY (Right Chest)  . NODE DISSECTION  03/29/2019   Procedure: Node Dissection;  Surgeon: [name], MD;  Location: [facility];  Service: Thoracic;;  . plantar fascia Left   . SHOULDER ARTHROSCOPY WITH DEBRIDEMENT AND BICEP TENDON REPAIR Left 09/25/2018   Procedure: SHOULDER ARTHROSCOPY WITH DEBRIDEMENT DECOMPRESSION AND BICEP TENDON REPAIR - SCRUB TECH;  Surgeon: Corky Mull, MD;  Location: [facility] ORS;  Service: Orthopedics;  Laterality: Left;  . TONSILLECTOMY    . VIDEO BRONCHOSCOPY WITH ENDOBRONCHIAL NAVIGATION N/A 03/04/2019   Procedure: VIDEO BRONCHOSCOPY WITH ENDOBRONCHIAL NAVIGATION;  Surgeon: Ottie Glazier, MD;  Location: [facility] ORS;  Service: Thoracic;  Laterality: N/A;  . VIDEO BRONCHOSCOPY WITH ENDOBRONCHIAL ULTRASOUND N/A 03/04/2019   Procedure: VIDEO BRONCHOSCOPY WITH ENDOBRONCHIAL ULTRASOUND;  Surgeon: Ottie Glazier, MD;  Location: [facility] ORS;  Service: Thoracic;  Laterality: N/A;    SOCIAL HISTORY:   CURRENT MEDICATIONS:  Current Outpatient Medications on File Prior to Visit  Medication Sig Dispense Refill  . celecoxib (CELEBREX) 200 MG capsule Take 1 capsule (200 mg total) by mouth 2 (two) times daily with a meal. (Patient taking differently: Take 200-400 mg by mouth daily. ) 60 capsule 1  . cyclobenzaprine (FLEXERIL) 10 MG tablet Take 10 mg by mouth 3 (three) times daily as needed for muscle spasms.     Marland Kitchen estradiol (ESTRACE) 0.5  MG tablet Take 0.5 mg by mouth daily.    . fluticasone (FLONASE) 50 MCG/ACT nasal spray Place 2 sprays into both nostrils daily as needed for allergies or rhinitis.     . furosemide (LASIX) 20 MG tablet Take 20 mg by mouth daily as needed for fluid or edema.     Marland Kitchen levothyroxine (SYNTHROID) 100 MCG tablet Take 1 tablet (100 mcg total) by mouth daily before breakfast. 30 tablet 1  . omeprazole (PRILOSEC) 20 MG capsule Take 20 mg by mouth 2 (two) times daily before a meal.     . traMADol (ULTRAM) 50 MG tablet  Take 50 mg by mouth 2 (two) times daily as needed for moderate pain.     . traZODone (DESYREL) 50 MG tablet Take 50 mg by mouth at bedtime.    . triamterene-hydrochlorothiazide (MAXZIDE-25) 37.5-25 MG tablet Take 1 tablet by mouth daily.    . VENTOLIN HFA 108 (90 Base) MCG/ACT inhaler Inhale 2 puffs into the lungs every 6 (six) hours as needed for wheezing or shortness of breath.     . zolpidem (AMBIEN) 5 MG tablet Take 5-7.5 mg by mouth at bedtime.      No current facility-administered medications on file prior to visit.    ALLERGIES: Allergies  Allergen Reactions  . Flagyl [Metronidazole] Diarrhea and Nausea And Vomiting    PASS OUT  . Pregabalin Other (See Comments)    "dizziness"     PHYSICAL EXAM:  There were no vitals filed for this visit. There were no vitals filed for this visit.  Exam limited due to telemedicine  General: well appearing patient in in no acute distress.  Respiratory: Breathing non-labored.    Neuro: Alert and oriented Psych: Normal mood and affect for situation.  LABORATORY DATA:  CBC    Component Value Date/Time   WBC 9.2 04/01/2019 0156   RBC 4.37 04/01/2019 0156   HGB 11.8 (L) 04/01/2019 0156   HCT 38.1 04/01/2019 0156   PLT 269 04/01/2019 0156   MCV 87.2 04/01/2019 0156   MCH 27.0 04/01/2019 0156   MCHC 31.0 04/01/2019 0156   RDW 14.8 04/01/2019 0156     Chemistry      Component Value Date/Time   NA 138 04/04/2019 0234   K 3.7 04/04/2019 0234   CL 106 04/04/2019 0234   CO2 25 04/04/2019 0234   BUN 7 (L) 04/04/2019 0234   CREATININE 0.74 04/04/2019 0234      Component Value Date/Time   CALCIUM 8.4 (L) 04/04/2019 0234   ALKPHOS 58 03/31/2019 0541   AST 19 03/31/2019 0541   ALT 13 03/31/2019 0541   BILITOT 0.6 03/31/2019 0541     DIAGNOSTIC IMAGING:  Pet Scan   IMPRESSION: 1. The right upper lobe nodule has a maximum SUV of 2.6, formerly 1.8. Currently this is considered somewhat worrisome for low-grade malignancy. On the  other hand, there is been significant interval expansion medially from this nodule compared to 03/04/2019, favoring mucous plugging in the subtending bronchus given the rapid progression. Such plugging could certainly occur in the setting of a tumor, or could be inflammatory. If percutaneous or bronchoscopic tissue sampling is performed, I would recommend targeting the more lateral portion of this process which corresponds to the original nodule. 2. Subtly accentuated small right hilar lymph node, maximum SUV 3.8, nonspecific. 3. Other imaging findings of potential clinical significance: Aortic Atherosclerosis (ICD10-I70.0). Coronary atherosclerosis. Mild cardiomegaly.  ASSESSMENT & PLAN:  Ms. Inclan is a pleasant 69 y.o.  female with history of Stage 1 lung cancer, treated with surgery; completed treatment on   Patient presents to survivorship clinic today for survivorship care plan visit and to address any acute survivorship concerns since completing treatment.   1. Stage 1 lung cancer: Today, he received a copy of his [facility]) document, which was reviewed with him in detail.  The SCP details his cancer treatment history and potential late/long-term side effects of those treatments.  We discussed the follow-up schedule he can anticipate with interval imaging for surveillance of his cancer.  I have also shared a copy of his treatment summary/SCP with his PCP.  We discussed the NCCN guidelines for surveillance after completion of definitive therapy.  We discussed that these guidelines are followed once there is no evidence of clinical or radiographic disease.  Stage I-II (primary treatment included surgery +/-chemotherapy -patients can expect to see a provider with history and physical exam and chest CT +/-contrast every 6 months for 2 to 3 years then history and physical and low-dose noncontrast enhanced chest CT annually thereafter  #. Problem at visit:   1. 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  On chronic pain medicine.  Patient is interested in our care program for suggestions on weightbearing exercises to help with her chronic comorbid conditions.  2. Tinnitus: Unclear etiology but has follow-up with PCP  3. Depression/anxiety: Currently on Wellbutrin-appears stable.  Has Xanax for breakthrough anxiety and depression.   4.  Exertional shortness of breath: Likely secondary to lobectomy back in February 2021.  #. Smoking cessation: I commended continued efforts to remain tobacco-free.  We discussed that one of the most important risk reduction strategies in preventing cancer recurrence in lung cancer patients is smoking cessation.  Patient is committed to abstaining from tobacco.    #. Physical activity/Healthy eating: Getting adequate physical activity and maintaining a healthy diet as a cancer survivor is important for overall wellness and reduces the risk of cancer recurrence. We discussed the CARE program, which is a fitness program that is offered to cancer survivors free of charge.  We also reviewed "The Nutrition Rainbow" handout, as well as the American Cancer Society's booklet with recommendations for nutrition and physical activity.    #. Health promotion/Cancer screening:  Ms. Bingman is reportedly up-to-date on her cancer screening tests and vaccinations. I encouraged her to talk with PCP about arranging appropriate cancer screening tests, as appropriate.   #. Support services/Counseling: It is not uncommon for this period of the patient's cancer care trajectory to be one of many emotions and stressors.  Ms. Bourne was encouraged to take advantage of our many support services programs, support groups, and/or counseling in coping with her new life as a cancer survivor after completing anti-cancer treatment. We specifically discussed the Cancer Transitions program. He was given a calendar of the cancer center's support  services events, as well as brochures for our spiritual care and free counseling resources.    Disposition/Return to clinic  -Referral placed for care program -Return to survivorship clinic as needed; no additional follow-up needed at this time.  -Consider transitioning the patient to long-term survivorship, when clinically appropriate.    I discussed the assessment and treatment plan with the patient. The patient was provided an opportunity to ask questions and all were answered. The patient agreed with the plan and demonstrated an understanding of the instructions.   The patient was advised to call back or seek an  in-person evaluation if the symptoms worsen or if the condition fails to improve as anticipated.   I provided 25 minutes of non face-to-face telephone visit time during this encounter, and > 50% was spent counseling as documented under my assessment & plan.  Rulon Abide, NP, AGNP-C [facility] [phone] (clinic)  CC: Dr. Rogue Bussing

## 2019-10-14 ENCOUNTER — Telehealth: Payer: Self-pay | Admitting: *Deleted

## 2019-10-14 NOTE — Telephone Encounter (Signed)
-----   Message from Loa Socks sent at 10/14/2019  9:38 AM EDT ----- Regarding: Cx CT Chest Please Cx CT. Per [employer] CT chest denied.  Stating no results of previous scans were provided or physical exam results stating why this service is needed. MR's were faxed 07/04/19. Fast appeal has been filed, 72hr turn around. TY

## 2019-10-14 NOTE — Telephone Encounter (Signed)
Contacted patient. Explained the delays in her ct scan approval. pt gave verbal understanding. She would like to r/s the scan for 9/10- Friday in [place] if possible and have her apts with Dr. Rogue Bussing on 11/01/19. She is unable to do any apts on Monday 10/28/2019 due to having a PFT scheduled. She keeps her [place] most of the week and is limited on apts for Mondays and Friday's only).  Colette please r/s her ct scan apts in [place] 9/10 (pt preference) and follow-up on 9/17 with Dr. Jacinto Reap

## 2019-10-16 ENCOUNTER — Ambulatory Visit: Admission: RE | Admit: 2019-10-16 | Payer: Medicare HMO | Source: Ambulatory Visit

## 2019-10-18 ENCOUNTER — Other Ambulatory Visit: Payer: Medicare HMO

## 2019-10-18 ENCOUNTER — Ambulatory Visit: Payer: Medicare HMO | Admitting: Internal Medicine

## 2019-10-25 ENCOUNTER — Ambulatory Visit: Admission: RE | Admit: 2019-10-25 | Payer: Medicare HMO | Source: Ambulatory Visit

## 2019-10-28 ENCOUNTER — Ambulatory Visit: Payer: Medicare HMO | Admitting: Internal Medicine

## 2019-10-28 ENCOUNTER — Other Ambulatory Visit: Payer: Medicare HMO

## 2019-10-28 ENCOUNTER — Telehealth: Payer: Self-pay | Admitting: Internal Medicine

## 2019-10-28 NOTE — Telephone Encounter (Signed)
10/28/2019  Left VM for pt informing her that we r/s her CT chest scan that she missed on 9/10 for 9/16 @ 9:00, arrive by 8:45, NPO 4 hours prior. Also informed pt to get pre-scan labwork on 9/15 @ 9:30, and reminding her of her appt to see Dr. Jacinto Reap on 9/17. Left call back number if pt has any questions  SRW

## 2019-10-30 ENCOUNTER — Other Ambulatory Visit: Payer: Self-pay

## 2019-10-30 ENCOUNTER — Inpatient Hospital Stay: Payer: Medicare HMO | Attending: Hematology and Oncology

## 2019-10-30 ENCOUNTER — Other Ambulatory Visit: Payer: Medicare HMO

## 2019-10-30 DIAGNOSIS — C3411 Malignant neoplasm of upper lobe, right bronchus or lung: Secondary | ICD-10-CM | POA: Diagnosis not present

## 2019-10-30 LAB — CBC WITH DIFFERENTIAL/PLATELET
Abs Immature Granulocytes: 0.02 10*3/uL (ref 0.00–0.07)
Basophils Absolute: 0.1 10*3/uL (ref 0.0–0.1)
Basophils Relative: 1 %
Eosinophils Absolute: 0.2 10*3/uL (ref 0.0–0.5)
Eosinophils Relative: 3 %
HCT: 45.2 % (ref 36.0–46.0)
Hemoglobin: 14.3 g/dL (ref 12.0–15.0)
Immature Granulocytes: 0 %
Lymphocytes Relative: 34 %
Lymphs Abs: 2.4 10*3/uL (ref 0.7–4.0)
MCH: 24.9 pg — ABNORMAL LOW (ref 26.0–34.0)
MCHC: 31.6 g/dL (ref 30.0–36.0)
MCV: 78.7 fL — ABNORMAL LOW (ref 80.0–100.0)
Monocytes Absolute: 0.6 10*3/uL (ref 0.1–1.0)
Monocytes Relative: 9 %
Neutro Abs: 3.7 10*3/uL (ref 1.7–7.7)
Neutrophils Relative %: 53 %
Platelets: 326 10*3/uL (ref 150–400)
RBC: 5.74 MIL/uL — ABNORMAL HIGH (ref 3.87–5.11)
RDW: 16 % — ABNORMAL HIGH (ref 11.5–15.5)
WBC: 7.1 10*3/uL (ref 4.0–10.5)
nRBC: 0 % (ref 0.0–0.2)

## 2019-10-30 LAB — COMPREHENSIVE METABOLIC PANEL
ALT: 14 U/L (ref 0–44)
AST: 27 U/L (ref 15–41)
Albumin: 3.9 g/dL (ref 3.5–5.0)
Alkaline Phosphatase: 66 U/L (ref 38–126)
Anion gap: 11 (ref 5–15)
BUN: 13 mg/dL (ref 8–23)
CO2: 26 mmol/L (ref 22–32)
Calcium: 9 mg/dL (ref 8.9–10.3)
Chloride: 100 mmol/L (ref 98–111)
Creatinine, Ser: 0.89 mg/dL (ref 0.44–1.00)
GFR calc Af Amer: >60 mL/min (ref >60)
GFR calc non Af Amer: >60 mL/min (ref >60)
Glucose, Bld: 101 mg/dL — ABNORMAL HIGH (ref 70–99)
Potassium: 3.8 mmol/L (ref 3.5–5.1)
Sodium: 137 mmol/L (ref 135–145)
Total Bilirubin: 0.6 mg/dL (ref 0.3–1.2)
Total Protein: 7.4 g/dL (ref 6.5–8.1)

## 2019-10-31 ENCOUNTER — Ambulatory Visit
Admission: RE | Admit: 2019-10-31 | Discharge: 2019-10-31 | Disposition: A | Discharge status: Home / Self Care | Payer: Medicare HMO | Source: Ambulatory Visit | Attending: Internal Medicine | Admitting: Internal Medicine

## 2019-10-31 ENCOUNTER — Telehealth: Payer: Self-pay | Admitting: *Deleted

## 2019-10-31 ENCOUNTER — Other Ambulatory Visit: Payer: Self-pay

## 2019-10-31 DIAGNOSIS — C3411 Malignant neoplasm of upper lobe, right bronchus or lung: Secondary | ICD-10-CM | POA: Diagnosis not present

## 2019-10-31 MED ORDER — IOHEXOL 300 MG/ML  SOLN
75.0000 mL | Freq: Once | INTRAMUSCULAR | Status: AC | PRN
  Administered 2019-10-31: 75 mL via INTRAVENOUS

## 2019-10-31 NOTE — Telephone Encounter (Signed)
Patient has apt in clinic tomorrow to discuss

## 2019-10-31 NOTE — Telephone Encounter (Signed)
Called report  IMPRESSION: 1. Signs of prior RIGHT upper lobectomy. 2. Spiculated nodule that is quite similar to the previous nodule though much smaller in the RIGHT lower lobe. Findings are concerning for additional site of disease. Post inflammatory process is also considered. Given small size would suggest short interval follow-up for further assessment. 3. Trace RIGHT effusion and pleural thickening likely postoperative, attention on follow-up 4. Marked thickening of the distal esophagus is circumferential with some thickening also of the stomach. Would correlate with any symptoms of esophagitis. Endoscopic assessment may be helpful for further evaluation, to exclude the possibility of neoplasm or significant inflammation/ulceration. 5. Aortic atherosclerosis.  Aortic Atherosclerosis (ICD10-I70.0).   Electronically Signed   By: Zetta Bills M.D.   On: 10/31/2019 13:05

## 2019-11-01 ENCOUNTER — Inpatient Hospital Stay: Payer: Medicare HMO

## 2019-11-01 ENCOUNTER — Inpatient Hospital Stay (HOSPITAL_BASED_OUTPATIENT_CLINIC_OR_DEPARTMENT_OTHER): Payer: Medicare HMO | Admitting: Internal Medicine

## 2019-11-01 VITALS — BP 179/76 | HR 57 | Temp 97.6°F | Resp 20 | Ht 62.5 in | Wt 257.0 lb

## 2019-11-01 DIAGNOSIS — R911 Solitary pulmonary nodule: Secondary | ICD-10-CM

## 2019-11-01 DIAGNOSIS — C3411 Malignant neoplasm of upper lobe, right bronchus or lung: Secondary | ICD-10-CM

## 2019-11-01 NOTE — Progress Notes (Signed)
[place] NOTE  Patient Care Team: Tracie Harrier, MD as PCP - General (Internal Medicine) Telford Nab, RN as Oncology Nurse Navigator [name], MD as Consulting Physician (Cardiothoracic Surgery) Cammie Sickle, MD as Consulting Physician (Internal Medicine) Nestor Lewandowsky, MD as Referring Physician (Cardiothoracic Surgery)  CHIEF COMPLAINTS/PURPOSE OF CONSULTATION: lung cancer  #  Oncology History Overview Note  # JAN 2021- ADENOCA [Dr.A; [place] 2021-]DEC 2020- 13 mm lobular somewhat branching right upper lobe lesion is not hypermetabolic.Number of Lymph Nodes Examined: 17 ; Pathologic Stage Classification (pTNM, AJCC 8th Edition): pT1c, pN0 [Dr.Henderickson]; STAGE I-adenocarcinoma; no adjuvant therapy  # SURVIVORSHIP: P  # GENETICS: NA  DIAGNOSIS: lung cancer  STAGE:  I       ;  GOALS: cure  CURRENT/MOST RECENT THERAPY : Surveillance    Malignant neoplasm of right upper lobe of lung ([place])  03/08/2019 Initial Diagnosis   Malignant neoplasm of right upper lobe of lung ([place])   04/03/2019 Cancer Staging   Staging form: Lung, AJCC 8th Edition - Clinical stage from 04/03/2019: ycT1, cN0, cM0 - Signed by [name], MD on 04/03/2019      HISTORY OF PRESENTING ILLNESS:  Glenda Gomez 69 y.o.  female with right upper lobe stage I lung cancer is here for follow-up/review results of the CT scan.  Patient denies any new shortness of breath or cough.  Denies any worsening pain.  No headaches.  Patient complains of chronic reflux.  Review of Systems  Constitutional: Negative for chills, diaphoresis, fever, malaise/fatigue and weight loss.  HENT: Negative for nosebleeds and sore throat.   Eyes: Negative for double vision.  Respiratory: Positive for cough. Negative for hemoptysis, sputum production, shortness of breath and wheezing.   Cardiovascular: Negative for chest pain, palpitations, orthopnea and leg swelling.   Gastrointestinal: Negative for abdominal pain, blood in stool, constipation, diarrhea, heartburn, melena, nausea and vomiting.  Genitourinary: Negative for dysuria, frequency and urgency.  Musculoskeletal: Positive for joint pain. Negative for back pain.  Skin: Negative.  Negative for itching and rash.  Neurological: Negative for dizziness, tingling, focal weakness, weakness and headaches.  Endo/Heme/Allergies: Does not bruise/bleed easily.  Psychiatric/Behavioral: Negative for depression. The patient is not nervous/anxious and does not have insomnia.      MEDICAL HISTORY:  Past Medical History:  Diagnosis Date  . Adenocarcinoma ([place])    right upper lobe  . Arthritis   . Chicken pox   . Dyspnea   . Environmental and seasonal allergies   . Fibromyalgia   . GERD (gastroesophageal reflux disease)   . Hemorrhoid   . Hypersomnia   . Hypertension   . Hypothyroidism   . Lung cancer ([place])   . Malignant neoplasm of right upper lobe of lung ([place]) 03/08/2019  . Myositis ossificans   . Osteoarthritis   . Thyroid disease     SURGICAL HISTORY: Past Surgical History:  Procedure Laterality Date  .  bengin tongue lesion     Benign  . ABDOMINAL HYSTERECTOMY    . BACK SURGERY    . bladder tack    . BREAST BIOPSY Left    neg  . CHOLECYSTECTOMY    . COLONOSCOPY WITH PROPOFOL N/A 03/24/2017   Procedure: COLONOSCOPY WITH PROPOFOL;  Surgeon: Lollie Sails, MD;  Location: [facility] ENDOSCOPY;  Service: Endoscopy;  Laterality: N/A;  . ESOPHAGOGASTRODUODENOSCOPY (EGD) WITH PROPOFOL N/A 03/24/2017   Procedure: ESOPHAGOGASTRODUODENOSCOPY (EGD) WITH PROPOFOL;  Surgeon: Lollie Sails, MD;  Location: [facility]  ENDOSCOPY;  Service: Endoscopy;  Laterality: N/A;  . GASTRIC BYPASS OPEN    . hemrrhoidectomy    . INTERCOSTAL NERVE BLOCK Right 03/29/2019   Procedure: Intercostal Nerve Block;  Surgeon: [name], MD;  Location: [facility];  Service: Thoracic;  Laterality: Right;  . JOINT REPLACEMENT  Bilateral    TKR  . KNEE ARTHROPLASTY Right 07/06/2016   Procedure: COMPUTER ASSISTED TOTAL KNEE ARTHROPLASTY;  Surgeon: Dereck Leep, MD;  Location: [facility] ORS;  Service: Orthopedics;  Laterality: Right;  . KNEE ARTHROSCOPY    . LOBECTOMY Right 03/29/2019   XI ROBOTIC ASSISTED THORASCOPY- RIGHT UPPER LOBECTOMY (Right Chest)  . NODE DISSECTION  03/29/2019   Procedure: Node Dissection;  Surgeon: [name], MD;  Location: [place];  Service: Thoracic;;  . plantar fascia Left   . SHOULDER ARTHROSCOPY WITH DEBRIDEMENT AND BICEP TENDON REPAIR Left 09/25/2018   Procedure: SHOULDER ARTHROSCOPY WITH DEBRIDEMENT DECOMPRESSION AND BICEP TENDON REPAIR - SCRUB TECH;  Surgeon: Corky Mull, MD;  Location: [facility] ORS;  Service: Orthopedics;  Laterality: Left;  . TONSILLECTOMY    . VIDEO BRONCHOSCOPY WITH ENDOBRONCHIAL NAVIGATION N/A 03/04/2019   Procedure: VIDEO BRONCHOSCOPY WITH ENDOBRONCHIAL NAVIGATION;  Surgeon: Ottie Glazier, MD;  Location: [facility] ORS;  Service: Thoracic;  Laterality: N/A;  . VIDEO BRONCHOSCOPY WITH ENDOBRONCHIAL ULTRASOUND N/A 03/04/2019   Procedure: VIDEO BRONCHOSCOPY WITH ENDOBRONCHIAL ULTRASOUND;  Surgeon: Ottie Glazier, MD;  Location: [facility] ORS;  Service: Thoracic;  Laterality: N/A;    SOCIAL HISTORY: Social History   Socioeconomic History  . Marital status: Divorced    Spouse name: Not on file  . Number of children: Not on file  . Years of education: Not on file  . Highest education level: Not on file  Occupational History  . Not on file  Tobacco Use  . Smoking status: Former Smoker    Packs/day: 2.00    Years: 10.00    Pack years: 20.00    Quit date: 07/17/1993    Years since quitting: 26.3  . Smokeless tobacco: Never Used  Vaping Use  . Vaping Use: Never used  Substance and Sexual Activity  . Alcohol use: No  . Drug use: No  . Sexual activity: Not on file  Other Topics Concern  . Not on file  Social History Narrative   1ppd- quit > 20 years ago; no  alcohol; in [place]; lives self. retd- cook.    Social Determinants of Health   Financial Resource Strain:   . Difficulty of Paying Living Expenses: Not on file  Food Insecurity:   . Worried About Charity fundraiser in the Last Year: Not on file  . Ran Out of Food in the Last Year: Not on file  Transportation Needs:   . Lack of Transportation (Medical): Not on file  . Lack of Transportation (Non-Medical): Not on file  Physical Activity:   . Days of Exercise per Week: Not on file  . Minutes of Exercise per Session: Not on file  Stress:   . Feeling of Stress : Not on file  Social Connections:   . Frequency of Communication with Friends and Family: Not on file  . Frequency of Social Gatherings with Friends and Family: Not on file  . Attends Religious Services: Not on file  . Active Member of Clubs or Organizations: Not on file  . Attends Archivist Meetings: Not on file  . Marital Status: Not on file  Intimate Partner Violence:   . Fear of  Current or Ex-Partner: Not on file  . Emotionally Abused: Not on file  . Physically Abused: Not on file  . Sexually Abused: Not on file    FAMILY HISTORY: Family History  Problem Relation Age of Onset  . Breast cancer Sister 43  . Breast cancer Maternal Aunt 80  . Lung cancer Maternal Aunt   . Breast cancer Sister 57  . Alzheimer's disease Mother   . Arthritis Mother   . Diabetes Mother   . Heart failure Brother   . Pancreatic cancer Brother 37  . Lung cancer Brother   . Liver cancer Father     ALLERGIES:  is allergic to flagyl [metronidazole] and pregabalin.  MEDICATIONS:  Current Outpatient Medications  Medication Sig Dispense Refill  . celecoxib (CELEBREX) 200 MG capsule Take 1 capsule (200 mg total) by mouth 2 (two) times daily with a meal. (Patient taking differently: Take 200-400 mg by mouth daily. ) 60 capsule 1  . estradiol (ESTRACE) 0.5 MG tablet Take 0.5 mg by mouth daily.    . fluticasone (FLONASE) 50 MCG/ACT  nasal spray Place 2 sprays into both nostrils daily as needed for allergies or rhinitis.     . furosemide (LASIX) 20 MG tablet Take 20 mg by mouth daily as needed for fluid or edema.     Marland Kitchen levothyroxine (SYNTHROID) 100 MCG tablet Take 1 tablet (100 mcg total) by mouth daily before breakfast. 30 tablet 1  . omeprazole (PRILOSEC) 20 MG capsule Take 20 mg by mouth 2 (two) times daily before a meal.     . traMADol (ULTRAM) 50 MG tablet Take 50 mg by mouth 2 (two) times daily as needed for moderate pain.     Marland Kitchen triamterene-hydrochlorothiazide (MAXZIDE-25) 37.5-25 MG tablet Take 1 tablet by mouth daily.    . VENTOLIN HFA 108 (90 Base) MCG/ACT inhaler Inhale 2 puffs into the lungs every 6 (six) hours as needed for wheezing or shortness of breath.     . zolpidem (AMBIEN) 5 MG tablet Take 5-7.5 mg by mouth at bedtime.     . cyclobenzaprine (FLEXERIL) 10 MG tablet Take 10 mg by mouth 3 (three) times daily as needed for muscle spasms.  (Patient not taking: Reported on 10/31/2019)    . traZODone (DESYREL) 50 MG tablet Take 50 mg by mouth at bedtime. (Patient not taking: Reported on 10/31/2019)    . Vitamin D, Ergocalciferol, (DRISDOL) 1.25 MG (50000 UNIT) CAPS capsule Take 50,000 Units by mouth once a week. (Patient not taking: Reported on 10/31/2019)     No current facility-administered medications for this visit.      Marland Kitchen  PHYSICAL EXAMINATION: ECOG PERFORMANCE STATUS: 0 - Asymptomatic  Vitals:   11/01/19 0954  BP: (!) 179/76  Pulse: (!) 57  Resp: 20  Temp: 97.6 F (36.4 C)  SpO2: 99%   Filed Weights   11/01/19 0954  Weight: 257 lb (116.6 kg)    Physical Exam Constitutional:      Comments: Obese. She is walking herself.  HENT:     Head: Normocephalic and atraumatic.     Mouth/Throat:     Pharynx: No oropharyngeal exudate.  Eyes:     Pupils: Pupils are equal, round, and reactive to light.  Cardiovascular:     Rate and Rhythm: Normal rate and regular rhythm.  Pulmonary:     Effort:  Pulmonary effort is normal. No respiratory distress.     Breath sounds: Normal breath sounds. No wheezing.  Abdominal:  General: Bowel sounds are normal. There is no distension.     Palpations: Abdomen is soft. There is no mass.     Tenderness: There is no abdominal tenderness. There is no guarding or rebound.  Musculoskeletal:        General: No tenderness. Normal range of motion.     Cervical back: Normal range of motion and neck supple.  Skin:    General: Skin is warm.  Neurological:     Mental Status: She is alert and oriented to person, place, and time.  Psychiatric:        Mood and Affect: Affect normal.    LABORATORY DATA:  I have reviewed the data as listed Lab Results  Component Value Date   WBC 7.1 10/30/2019   HGB 14.3 10/30/2019   HCT 45.2 10/30/2019   MCV 78.7 (L) 10/30/2019   PLT 326 10/30/2019   Recent Labs    03/27/19 1023 03/30/19 0440 03/31/19 0541 04/01/19 0156 04/03/19 0230 04/04/19 0234 10/30/19 0852  NA 137   < > 138   < > 140 138 137  K 3.9   < > 3.9   < > 3.6 3.7 3.8  CL 102   < > 104   < > 105 106 100  CO2 23   < > 27   < > 25 25 26   GLUCOSE 108*   < > 93   < > 122* 101* 101*  BUN 9   < > 12   < > 7* 7* 13  CREATININE 0.71   < > 0.96   < > 0.71 0.74 0.89  CALCIUM 9.4   < > 8.2*   < > 8.6* 8.4* 9.0  GFRNONAA >60   < > >60   < > >60 >60 >60  GFRAA >60   < > >60   < > >60 >60 >60  PROT 6.9  --  5.3*  --   --   --  7.4  ALBUMIN 3.7  --  2.4*  --   --   --  3.9  AST 21  --  19  --   --   --  27  ALT 15  --  13  --   --   --  14  ALKPHOS 78  --  58  --   --   --  66  BILITOT 0.7  --  0.6  --   --   --  0.6   < > = values in this interval not displayed.    RADIOGRAPHIC STUDIES: I have personally reviewed the radiological images as listed and agreed with the findings in the report. CT CHEST W CONTRAST  Result Date: 10/31/2019 CLINICAL DATA:  Lung cancer follow-up, 6 months post RIGHT upper lobe resection. EXAM: CT CHEST WITH CONTRAST  TECHNIQUE: Multidetector CT imaging of the chest was performed during intravenous contrast administration. CONTRAST:  63mL OMNIPAQUE IOHEXOL 300 MG/ML  SOLN COMPARISON:  03/04/2019 FINDINGS: Cardiovascular: Calcified atheromatous plaque, noncalcified plaque in the thoracic aorta without aneurysmal dilation. Normal heart size without pericardial effusion. Distortion of RIGHT hilum following partial lung resection. Central pulmonary vasculature otherwise unremarkable on venous phase. Mediastinum/Nodes: No axillary, thoracic inlet or mediastinal lymphadenopathy. No hilar lymphadenopathy. Lungs/Pleura: Distortion of RIGHT hilum following RIGHT upper lobectomy. Spiculated nodule that is quite similar to the previous nodule though much smaller in the RIGHT lower lobe measuring 5 by 5 mm. The airways are patent. No consolidation. No pleural effusion. Upper Abdomen:  With normal adrenal glands. Low-density lesion arising from the upper pole of the RIGHT kidney potentially a partially collapsed cyst is unchanged since 2019. Signs of prior bariatric procedures similar to previous imaging though with marked thickening, circumferential of the distal esophagus and proximal portion of the stomach. Musculoskeletal: No acute musculoskeletal process. No destructive bone finding. IMPRESSION: 1. Signs of prior RIGHT upper lobectomy. 2. Spiculated nodule that is quite similar to the previous nodule though much smaller in the RIGHT lower lobe. Findings are concerning for additional site of disease. Post inflammatory process is also considered. Given small size would suggest short interval follow-up for further assessment. 3. Trace RIGHT effusion and pleural thickening likely postoperative, attention on follow-up 4. Marked thickening of the distal esophagus is circumferential with some thickening also of the stomach. Would correlate with any symptoms of esophagitis. Endoscopic assessment may be helpful for further evaluation, to exclude  the possibility of neoplasm or significant inflammation/ulceration. 5. Aortic atherosclerosis. Aortic Atherosclerosis (ICD10-I70.0). Electronically Signed   By: Zetta Bills M.D.   On: 10/31/2019 13:05    ASSESSMENT & PLAN:   Malignant neoplasm of right upper lobe of lung ([place]) # RIGHT UPPER LOBE LUNG CA-stage I-status post resection clear margins; SEP 2021- CT chest NED; except 5 mm RLL nodule  #5 mm right lower lobe nodule-spiculated'-similar unchanged/slightly smaller to previous scan.  Recommend imaging again in 4 months.  #Reflux/GERD on PPI.  CT scan findings of distal esophagus thickening noted reviewed with the patient.  Patient previous endoscopies.   #  Patient is to unvaccinated to [place].  Concerned about potential adverse events.  I discussed regarding the concerns for extremely high risk of infection/serious complications from the new variants.  Benefits of Covid vaccine outweigh the risk at any time; and strongly recommend COVID-19 vaccination.   # DISPOSITION: # follow up in 4 months; MD; labs- cbc/cmp;CT chest prior- Dr.B  # I reviewed the blood work- with the patient in detail; also reviewed the imaging independently [as summarized above]; and with the patient in detail.      All questions were answered. The patient knows to call the clinic with any problems, questions or concerns.    Cammie Sickle, MD 11/03/2019 6:09 PM

## 2019-11-01 NOTE — Progress Notes (Signed)
States she has noticed a ringing/buzzing in her ears within the last couple of months and wondered if her cancer has anything to do with that.

## 2019-11-01 NOTE — Assessment & Plan Note (Addendum)
#   RIGHT UPPER LOBE LUNG CA-stage I-status post resection clear margins; SEP 2021- CT chest NED; except 5 mm RLL nodule  #5 mm right lower lobe nodule-spiculated'-similar unchanged/slightly smaller to previous scan.  Recommend imaging again in 4 months.  #Reflux/GERD on PPI.  CT scan findings of distal esophagus thickening noted reviewed with the patient.  Patient previous endoscopies.   #  Patient is to unvaccinated to [place].  Concerned about potential adverse events.  I discussed regarding the concerns for extremely high risk of infection/serious complications from the new variants.  Benefits of Covid vaccine outweigh the risk at any time; and strongly recommend COVID-19 vaccination.   # DISPOSITION: # follow up in 4 months; MD; labs- cbc/cmp;CT chest prior- Dr.B  # I reviewed the blood work- with the patient in detail; also reviewed the imaging independently [as summarized above]; and with the patient in detail.

## 2019-12-16 ENCOUNTER — Other Ambulatory Visit: Payer: Self-pay

## 2019-12-16 ENCOUNTER — Encounter: Payer: Medicare HMO | Attending: Pulmonary Disease | Admitting: *Deleted

## 2019-12-16 DIAGNOSIS — C3490 Malignant neoplasm of unspecified part of unspecified bronchus or lung: Secondary | ICD-10-CM

## 2019-12-16 DIAGNOSIS — C3411 Malignant neoplasm of upper lobe, right bronchus or lung: Secondary | ICD-10-CM

## 2019-12-16 NOTE — Progress Notes (Signed)
Speaking with Kaileia, it was realized she thought this was a CARE referral.   Closing this referral and have requested a new CARE referral for [place].  Cancelled the EP eval for Tuesday.

## 2019-12-23 ENCOUNTER — Telehealth: Payer: Self-pay | Admitting: Hematology and Oncology

## 2019-12-23 NOTE — Telephone Encounter (Signed)
12/23/2019 Called to inform pt that her appt for labs and CT scan on 12/20 have been moved to the same time on 12/21 instead, per Loma Sender in [facility] due to change in operating hours starting in December. Pt did not answer. Left a VM informing pt of appt change and to call back if there are any issues with this new date.  SRW

## 2019-12-31 ENCOUNTER — Other Ambulatory Visit: Payer: Self-pay

## 2019-12-31 VITALS — Ht 65.5 in | Wt 259.1 lb

## 2019-12-31 DIAGNOSIS — C3490 Malignant neoplasm of unspecified part of unspecified bronchus or lung: Secondary | ICD-10-CM

## 2019-12-31 NOTE — Progress Notes (Addendum)
Daily Session Note  Patient Details  Name: Glenda Gomez MRN: [id number] Date of Birth: 1950-10-31 Referring Provider:    Encounter Date: 12/31/2019  Check In:  Session Check In - 12/31/19 1426      Check-In   Supervising physician immediately available to respond to emergencies See telemetry face sheet for immediately available ER MD    Location [facility]    Staff Present Coralie Keens, MS Exercise Physiologist;Jessica Luan Pulling, MA, RCEP, CCRP, CCET    Virtual Visit No    Medication changes reported     No    Fall or balance concerns reported    No    Warm-up and Cool-down Not performed (comment)   6MWT and Gym Orientation   Resistance Training Performed No   Only completed 6MWT today   VAD Patient? No    PAD/SET Patient? No             6 Minute Walk    Row Name 12/31/19 1418         6 Minute Walk   Phase Initial     Distance 600 feet     Walk Time 4.2 minutes     # of Rest Breaks 1  stopped at 4:27 minute     MPH 1.62     METS 1.09     RPE 13     Perceived Dyspnea  1     VO2 Peak 3.82     Symptoms Yes (comment)     Comments Leg/ back pain, "dull throb in head" 6/10- has myositis and fibromyalgia     Resting HR 65 bpm     Resting BP 162/84     Resting Oxygen Saturation  97 %     Exercise Oxygen Saturation  during 6 min walk 95 %     Max Ex. HR 83 bpm     Max Ex. BP 184/86     2 Minute Post BP 144/82             Exercise Prescription Changes - 12/31/19 1400      Response to Exercise   Blood Pressure (Admit) 162/84    Blood Pressure (Exercise) 184/86    Blood Pressure (Exit) 144/82    Heart Rate (Admit) 65 bpm    Heart Rate (Exercise) 83 bpm    Heart Rate (Exit) 65 bpm    Oxygen Saturation (Admit) 97 %    Oxygen Saturation (Exercise) 95 %    Oxygen Saturation (Exit) 97 %    Rating of Perceived Exertion (Exercise) 13    Perceived Dyspnea (Exercise) 1    Symptoms Leg/back pain, "dull throb in head" 6/10- has myositis and  fibromyalgia    Comments walk test results      Progression   Progression Continue to progress workloads to maintain intensity without signs/symptoms of physical distress.      Resistance Training   Weight 1 lb    Reps 10-15          Depression screen PHQ 2/9 12/31/2019  Decreased Interest 1  Down, Depressed, Hopeless 1  PHQ - 2 Score 2  Altered sleeping 3  Tired, decreased energy 3  Change in appetite 0  Feeling bad or failure about yourself  0  Trouble concentrating 0  Moving slowly or fidgety/restless 0  Suicidal thoughts 0  PHQ-9 Score 8  Difficult doing work/chores Somewhat difficult     Social History   Tobacco Use  Smoking Status Former Smoker  .  Packs/day: 2.00  . Years: 10.00  . Pack years: 20.00  . Quit date: 07/17/1993  . Years since quitting: 26.4  Smokeless Tobacco Never Used    Goals Met:  Proper associated with RPD/PD & O2 Sat Queuing for purse lip breathing No report of cardiac concerns or symptoms  Goals Unmet:  Not Applicable  Comments: First full day of orientation. All starting workloads were established based on the results of the 6 minute walk test done at initial orientation visit.  The plan for exercise progression was also introduced and progression will be customized based on patient's performance and goals.  Patient's time at CARE today had lasted for an extended period of time. Patient voiced frustration with her treatment and was upset with current health condition. Completed PHQ-9 today, with a total score of 8. Patient was offered help to connect with further resources to help with mental health, even including chaplain to come speak to her, however, patient declined all services and was very stern that she did not want any help at this time. Patient currently does not see a nurse navigator through the cancer center, and will reach out to [facility], RN, and see if patient can connect with one as she would benefit from further support.     Patient also complained of feeling a "dull throb" in her head which she states she feels almost everyday. It does resolve after a couple minutes. We did speak to the RN here and will reach out to her provider to ensure patient is safe to exercise here at CARE. Patient is in agreement with the above.   Dr. Emily Filbert is Medical Director for [facility] and [facility].

## 2020-01-07 ENCOUNTER — Telehealth: Payer: Self-pay

## 2020-01-07 ENCOUNTER — Telehealth: Payer: Self-pay | Admitting: *Deleted

## 2020-01-07 NOTE — Telephone Encounter (Signed)
Returned phone to [employer] in cardiac rehab. She left messages with cancer center reception to have Dr. Sharmaine Base team contact.  Spoke with Marcene Brawn, patient was supposed to start cardiac rehab yesterday. During her walk test, patient reported a 'dull throb' occurred during the 4 of the 6 minutes of the walk test. Is patient safe to exercise and clear to exercise. Her apt to come back is today at 1215 pm.  I spoke with Dr. Rogue Bussing- patient needs to hold off on care program at this time. Patient needs to be evaluated by pcp prior to coming to the care program. Marcene Brawn will touch base with the patient to inform her of this decision.   Marcene Brawn also mentioned that patient's ENT wanted patient to be referred to a neurologist. She stated that this mentioned by the patient. Dr. Jacinto Reap- defer to pcp for neurology referral?

## 2020-01-07 NOTE — Telephone Encounter (Signed)
Spoke with patient. Let her know I spoke with a nurse from the [facility] who stated that Dr. Rogue Bussing would like patient to reach out to PCP first to evaluate her "dull throb" that she has been experiencing in her head. Patient would need to be evaluated and/or cleared by doctor before exercising here with the CARE program. Patient in agreement and states she will call her PCP today. Will cancel CARE appointments this week and patient will call us to keep [place] updated.

## 2020-01-13 ENCOUNTER — Encounter: Payer: Self-pay | Admitting: Internal Medicine

## 2020-01-13 ENCOUNTER — Telehealth: Payer: Self-pay | Admitting: *Deleted

## 2020-01-13 NOTE — Telephone Encounter (Signed)
Patient called reporting that she has an appointment with Neurologist in January and that he is going to want a CT Head, but he cannot order this until she is seen by him. She is asking if Dr B will order CT head for same date as her CT Chest 12/21 because her insurance will only allow a CT every 3 months. Please advise

## 2020-01-13 NOTE — Telephone Encounter (Signed)
Dr. Jacinto Reap- my reply to the pt  I will give your message to Dr. Rogue Bussing. He will advise me and and I let you know if this is a test we can add.  At this time, Dr. B is only ordering the Ct scan of your chest.  Your insurance may or may not approve a test for a diagnosis that we are not treating you for.- Nira Conn

## 2020-01-13 NOTE — Telephone Encounter (Signed)
Dr. Jacinto Reap - patient also sent this mychart message.    I have a CT scan on Dec 21st, my question is will that include my head? if not can you have it added. I have an appt. with Neurologist on Jan 21, they will want a scan then, so I was hoping that it could be added with the chest CT you have scheduled for me already so I do not have to wait another 3 months for insurance to cover another,

## 2020-01-14 ENCOUNTER — Telehealth: Payer: Self-pay | Admitting: Internal Medicine

## 2020-01-14 NOTE — Telephone Encounter (Signed)
On 11/29-left voicemail for the patient to discuss her request for CT head.  In general would defer the CT head to her neurologist appointment.  However recommend call us if any questions or concerns.

## 2020-01-21 ENCOUNTER — Telehealth: Payer: Self-pay

## 2020-01-21 NOTE — Telephone Encounter (Signed)
Spoke with patient regarding CARE program. She has a Neurology appointment scheduled at the end of January and will hold off to start CARE until she gets clearance and/or other testing completed. Patient is in agreement with plan. Will hold referral until to be determined.

## 2020-02-03 ENCOUNTER — Other Ambulatory Visit: Payer: Medicare HMO

## 2020-02-03 ENCOUNTER — Ambulatory Visit: Payer: Medicare HMO

## 2020-02-04 ENCOUNTER — Telehealth: Payer: Self-pay | Admitting: Internal Medicine

## 2020-02-04 ENCOUNTER — Ambulatory Visit
Admission: RE | Admit: 2020-02-04 | Discharge: 2020-02-04 | Disposition: A | Discharge status: Home / Self Care | Payer: Medicare HMO | Source: Ambulatory Visit | Attending: Internal Medicine | Admitting: Internal Medicine

## 2020-02-04 ENCOUNTER — Inpatient Hospital Stay: Payer: Medicare HMO | Attending: Hematology and Oncology

## 2020-02-04 ENCOUNTER — Inpatient Hospital Stay: Admission: RE | Admit: 2020-02-04 | Payer: Medicare HMO | Source: Home / Self Care

## 2020-02-04 ENCOUNTER — Other Ambulatory Visit: Payer: Self-pay

## 2020-02-04 DIAGNOSIS — C3411 Malignant neoplasm of upper lobe, right bronchus or lung: Secondary | ICD-10-CM

## 2020-02-04 DIAGNOSIS — R911 Solitary pulmonary nodule: Secondary | ICD-10-CM

## 2020-02-04 LAB — POCT I-STAT CREATININE: Creatinine, Ser: 1 mg/dL (ref 0.44–1.00)

## 2020-02-04 MED ORDER — IOHEXOL 300 MG/ML  SOLN
75.0000 mL | Freq: Once | INTRAMUSCULAR | Status: AC | PRN
  Administered 2020-02-04: 75 mL via INTRAVENOUS

## 2020-02-04 NOTE — Telephone Encounter (Signed)
On 12/21-spoke to patient regarding results of the CT scan; no concern for any recurrent malignancy stable lung nodules.  Follow-up CT scan in 6 months.  C-Please reschedule patient's January appointment to end of June 2022; MD; labs CBC CMP; CT chest prior.  Thanks GB

## 2020-03-02 ENCOUNTER — Inpatient Hospital Stay: Payer: Medicare HMO | Admitting: Internal Medicine

## 2020-03-02 ENCOUNTER — Other Ambulatory Visit: Payer: Self-pay | Admitting: *Deleted

## 2020-03-02 ENCOUNTER — Telehealth: Payer: Self-pay | Admitting: *Deleted

## 2020-03-02 ENCOUNTER — Inpatient Hospital Stay: Payer: Medicare HMO

## 2020-03-02 DIAGNOSIS — C3411 Malignant neoplasm of upper lobe, right bronchus or lung: Secondary | ICD-10-CM

## 2020-03-02 NOTE — Telephone Encounter (Signed)
Contacted patient. Apt today at 1015 with Dr. Rogue Bussing. Patient elected to r/s her apts due to inclement weather. She requested to r/s to 1/20 (pt's preference).

## 2020-03-05 ENCOUNTER — Other Ambulatory Visit: Payer: Self-pay

## 2020-03-05 ENCOUNTER — Inpatient Hospital Stay: Payer: Medicare HMO | Attending: Internal Medicine

## 2020-03-05 ENCOUNTER — Inpatient Hospital Stay (HOSPITAL_BASED_OUTPATIENT_CLINIC_OR_DEPARTMENT_OTHER): Payer: Medicare HMO | Admitting: Internal Medicine

## 2020-03-05 DIAGNOSIS — M1990 Unspecified osteoarthritis, unspecified site: Secondary | ICD-10-CM | POA: Diagnosis not present

## 2020-03-05 DIAGNOSIS — R5383 Other fatigue: Secondary | ICD-10-CM | POA: Diagnosis not present

## 2020-03-05 DIAGNOSIS — C3411 Malignant neoplasm of upper lobe, right bronchus or lung: Secondary | ICD-10-CM | POA: Diagnosis not present

## 2020-03-05 DIAGNOSIS — R519 Headache, unspecified: Secondary | ICD-10-CM | POA: Insufficient documentation

## 2020-03-05 DIAGNOSIS — Z85118 Personal history of other malignant neoplasm of bronchus and lung: Secondary | ICD-10-CM | POA: Diagnosis not present

## 2020-03-05 LAB — CBC WITH DIFFERENTIAL/PLATELET
Abs Immature Granulocytes: 0.02 10*3/uL (ref 0.00–0.07)
Basophils Absolute: 0.1 10*3/uL (ref 0.0–0.1)
Basophils Relative: 1 %
Eosinophils Absolute: 0.2 10*3/uL (ref 0.0–0.5)
Eosinophils Relative: 2 %
HCT: 44.2 % (ref 36.0–46.0)
Hemoglobin: 14.6 g/dL (ref 12.0–15.0)
Immature Granulocytes: 0 %
Lymphocytes Relative: 35 %
Lymphs Abs: 2.2 10*3/uL (ref 0.7–4.0)
MCH: 26.8 pg (ref 26.0–34.0)
MCHC: 33 g/dL (ref 30.0–36.0)
MCV: 81.3 fL (ref 80.0–100.0)
Monocytes Absolute: 0.6 10*3/uL (ref 0.1–1.0)
Monocytes Relative: 9 %
Neutro Abs: 3.3 10*3/uL (ref 1.7–7.7)
Neutrophils Relative %: 53 %
Platelets: 285 10*3/uL (ref 150–400)
RBC: 5.44 MIL/uL — ABNORMAL HIGH (ref 3.87–5.11)
RDW: 15.5 % (ref 11.5–15.5)
WBC: 6.3 10*3/uL (ref 4.0–10.5)
nRBC: 0 % (ref 0.0–0.2)

## 2020-03-05 LAB — COMPREHENSIVE METABOLIC PANEL
ALT: 15 U/L (ref 0–44)
AST: 25 U/L (ref 15–41)
Albumin: 3.9 g/dL (ref 3.5–5.0)
Alkaline Phosphatase: 70 U/L (ref 38–126)
Anion gap: 9 (ref 5–15)
BUN: 11 mg/dL (ref 8–23)
CO2: 29 mmol/L (ref 22–32)
Calcium: 8.9 mg/dL (ref 8.9–10.3)
Chloride: 99 mmol/L (ref 98–111)
Creatinine, Ser: 0.91 mg/dL (ref 0.44–1.00)
GFR, Estimated: >60 mL/min (ref >60)
Glucose, Bld: 97 mg/dL (ref 70–99)
Potassium: 3.2 mmol/L — ABNORMAL LOW (ref 3.5–5.1)
Sodium: 137 mmol/L (ref 135–145)
Total Bilirubin: 0.7 mg/dL (ref 0.3–1.2)
Total Protein: 7.1 g/dL (ref 6.5–8.1)

## 2020-03-05 NOTE — Progress Notes (Signed)
Patient very tearful today. She reports frequent headaches and ringing of her ears. She reports intermittent episodes of severe fatigue in the middle of the day, where she has to stop and sit down. She reports increasing generalized body aches in her back and legs. She does not feel that her symptoms are being addressed by her pcp or endocrinologist. Her synthroid dosing was adjusted by endocrinology to 88 mcg. She does not know if the fatigue is related to abn thyroid levels.

## 2020-03-05 NOTE — Progress Notes (Signed)
[place] NOTE  Patient Care Team: Tracie Harrier, MD as PCP - General (Internal Medicine) Telford Nab, RN as Oncology Nurse Navigator [name], MD as Consulting Physician (Cardiothoracic Surgery) Cammie Sickle, MD as Consulting Physician (Internal Medicine) Nestor Lewandowsky, MD as Referring Physician (Cardiothoracic Surgery)  CHIEF COMPLAINTS/PURPOSE OF CONSULTATION: lung cancer  #  Oncology History Overview Note  # JAN 2021- ADENOCA [Dr.A; [place] 2021-]DEC 2020- 13 mm lobular somewhat branching right upper lobe lesion is not hypermetabolic.Number of Lymph Nodes Examined: 17 ; Pathologic Stage Classification (pTNM, AJCC 8th Edition): pT1c, pN0 [Dr.Henderickson]; STAGE I-adenocarcinoma; no adjuvant therapy  # SURVIVORSHIP: P  # GENETICS: NA  DIAGNOSIS: lung cancer  STAGE:  I       ;  GOALS: cure  CURRENT/MOST RECENT THERAPY : Surveillance    Malignant neoplasm of right upper lobe of lung ([place])  03/08/2019 Initial Diagnosis   Malignant neoplasm of right upper lobe of lung ([place])   04/03/2019 Cancer Staging   Staging form: Lung, AJCC 8th Edition - Clinical stage from 04/03/2019: ycT1, cN0, cM0 - Signed by [name], MD on 04/03/2019      HISTORY OF PRESENTING ILLNESS:  Glenda Gomez 70 y.o.  female with right upper lobe stage I lung cancer is here for follow-up/review results of the CT scan.  All of a sudden episodic extreme fatigue/ headaches; feels sleepy. One episode in 1 month or so. getting worse; lasting longer.  Patient also has multiple other complaints including pain in knees; bottom of the feet.  In the interim patient was also evaluated by ENT for vertigo.  Patient denies any new shortness of breath or cough.  Denies any worsening pain.   Review of Systems  Constitutional: Positive for malaise/fatigue. Negative for chills, diaphoresis, fever and weight loss.  HENT: Negative for nosebleeds and sore throat.    Eyes: Negative for double vision.  Respiratory: Negative for hemoptysis, sputum production, shortness of breath and wheezing.   Cardiovascular: Negative for chest pain, palpitations, orthopnea and leg swelling.  Gastrointestinal: Negative for abdominal pain, blood in stool, constipation, diarrhea, heartburn, melena, nausea and vomiting.  Genitourinary: Negative for dysuria, frequency and urgency.  Musculoskeletal: Positive for back pain and joint pain.  Skin: Negative.  Negative for itching and rash.  Neurological: Positive for dizziness, weakness and headaches. Negative for tingling and focal weakness.  Endo/Heme/Allergies: Does not bruise/bleed easily.  Psychiatric/Behavioral: Negative for depression. The patient is not nervous/anxious and does not have insomnia.      MEDICAL HISTORY:  Past Medical History:  Diagnosis Date  . Adenocarcinoma ([place])    right upper lobe  . Arthritis   . Chicken pox   . Dyspnea   . Environmental and seasonal allergies   . Fibromyalgia   . GERD (gastroesophageal reflux disease)   . Hemorrhoid   . Hypersomnia   . Hypertension   . Hypothyroidism   . Lung cancer ([place])   . Malignant neoplasm of right upper lobe of lung ([place]) 03/08/2019  . Myositis ossificans   . Osteoarthritis   . Thyroid disease     SURGICAL HISTORY: Past Surgical History:  Procedure Laterality Date  .  bengin tongue lesion     Benign  . ABDOMINAL HYSTERECTOMY    . BACK SURGERY    . bladder tack    . BREAST BIOPSY Left    neg  . CHOLECYSTECTOMY    . COLONOSCOPY WITH PROPOFOL N/A 03/24/2017   Procedure: COLONOSCOPY  WITH PROPOFOL;  Surgeon: Lollie Sails, MD;  Location: [facility] ENDOSCOPY;  Service: Endoscopy;  Laterality: N/A;  . ESOPHAGOGASTRODUODENOSCOPY (EGD) WITH PROPOFOL N/A 03/24/2017   Procedure: ESOPHAGOGASTRODUODENOSCOPY (EGD) WITH PROPOFOL;  Surgeon: Lollie Sails, MD;  Location: [facility] ENDOSCOPY;  Service: Endoscopy;  Laterality: N/A;  . GASTRIC BYPASS OPEN    .  hemrrhoidectomy    . INTERCOSTAL NERVE BLOCK Right 03/29/2019   Procedure: Intercostal Nerve Block;  Surgeon: [name], MD;  Location: [place];  Service: Thoracic;  Laterality: Right;  . JOINT REPLACEMENT Bilateral    TKR  . KNEE ARTHROPLASTY Right 07/06/2016   Procedure: COMPUTER ASSISTED TOTAL KNEE ARTHROPLASTY;  Surgeon: Dereck Leep, MD;  Location: [facility] ORS;  Service: Orthopedics;  Laterality: Right;  . KNEE ARTHROSCOPY    . LOBECTOMY Right 03/29/2019   XI ROBOTIC ASSISTED THORASCOPY- RIGHT UPPER LOBECTOMY (Right Chest)  . NODE DISSECTION  03/29/2019   Procedure: Node Dissection;  Surgeon: [name], MD;  Location: [place];  Service: Thoracic;;  . plantar fascia Left   . SHOULDER ARTHROSCOPY WITH DEBRIDEMENT AND BICEP TENDON REPAIR Left 09/25/2018   Procedure: SHOULDER ARTHROSCOPY WITH DEBRIDEMENT DECOMPRESSION AND BICEP TENDON REPAIR - SCRUB TECH;  Surgeon: Corky Mull, MD;  Location: [facility] ORS;  Service: Orthopedics;  Laterality: Left;  . TONSILLECTOMY    . VIDEO BRONCHOSCOPY WITH ENDOBRONCHIAL NAVIGATION N/A 03/04/2019   Procedure: VIDEO BRONCHOSCOPY WITH ENDOBRONCHIAL NAVIGATION;  Surgeon: Ottie Glazier, MD;  Location: [facility] ORS;  Service: Thoracic;  Laterality: N/A;  . VIDEO BRONCHOSCOPY WITH ENDOBRONCHIAL ULTRASOUND N/A 03/04/2019   Procedure: VIDEO BRONCHOSCOPY WITH ENDOBRONCHIAL ULTRASOUND;  Surgeon: Ottie Glazier, MD;  Location: [facility] ORS;  Service: Thoracic;  Laterality: N/A;    SOCIAL HISTORY: Social History   Socioeconomic History  . Marital status: Divorced    Spouse name: Not on file  . Number of children: Not on file  . Years of education: Not on file  . Highest education level: Not on file  Occupational History  . Not on file  Tobacco Use  . Smoking status: Former Smoker    Packs/day: 2.00    Years: 10.00    Pack years: 20.00    Quit date: 07/17/1993    Years since quitting: 26.6  . Smokeless tobacco: Never Used  Vaping Use  . Vaping  Use: Never used  Substance and Sexual Activity  . Alcohol use: No  . Drug use: No  . Sexual activity: Not on file  Other Topics Concern  . Not on file  Social History Narrative   1ppd- quit > 20 years ago; no alcohol; in [place]; lives self. retd- cook.    Social Determinants of Health   Financial Resource Strain: Not on file  Food Insecurity: Not on file  Transportation Needs: Not on file  Physical Activity: Not on file  Stress: Not on file  Social Connections: Not on file  Intimate Partner Violence: Not on file    FAMILY HISTORY: Family History  Problem Relation Age of Onset  . Breast cancer Sister 53  . Breast cancer Maternal Aunt 80  . Lung cancer Maternal Aunt   . Breast cancer Sister 36  . Alzheimer's disease Mother   . Arthritis Mother   . Diabetes Mother   . Heart failure Brother   . Pancreatic cancer Brother 72  . Lung cancer Brother   . Liver cancer Father     ALLERGIES:  is allergic to flagyl [metronidazole] and pregabalin.  MEDICATIONS:  Current Outpatient Medications  Medication Sig Dispense Refill  . celecoxib (CELEBREX) 200 MG capsule Take 1 capsule (200 mg total) by mouth 2 (two) times daily with a meal. (Patient taking differently: Take 200-400 mg by mouth daily.) 60 capsule 1  . estradiol (ESTRACE) 0.5 MG tablet Take 0.5 mg by mouth daily.    . fluticasone (FLONASE) 50 MCG/ACT nasal spray Place 2 sprays into both nostrils daily as needed for allergies or rhinitis.     . furosemide (LASIX) 20 MG tablet Take 20 mg by mouth daily as needed for fluid or edema.     Marland Kitchen levothyroxine (SYNTHROID) 88 MCG tablet Take 88 mcg by mouth daily.    Marland Kitchen omeprazole (PRILOSEC) 20 MG capsule Take 20 mg by mouth 2 (two) times daily before a meal.     . phentermine (ADIPEX-P) 37.5 MG tablet Take 37.5 mg by mouth at bedtime.    . triamterene-hydrochlorothiazide (MAXZIDE-25) 37.5-25 MG tablet Take 1 tablet by mouth daily.    . VENTOLIN HFA 108 (90 Base) MCG/ACT inhaler Inhale  2 puffs into the lungs every 6 (six) hours as needed for wheezing or shortness of breath.     . Vitamin D, Ergocalciferol, (DRISDOL) 1.25 MG (50000 UNIT) CAPS capsule Take 50,000 Units by mouth once a week.     . zolpidem (AMBIEN) 5 MG tablet Take 5-7.5 mg by mouth at bedtime.     No current facility-administered medications for this visit.      Marland Kitchen  PHYSICAL EXAMINATION: ECOG PERFORMANCE STATUS: 0 - Asymptomatic  Vitals:   03/05/20 1040  BP: 134/84  Pulse: 82  Resp: 18  Temp: 98.7 F (37.1 C)  SpO2: 100%   Filed Weights   03/05/20 1040  Weight: 248 lb 3.2 oz (112.6 kg)    Physical Exam Constitutional:      Comments: Obese. She is walking herself.  HENT:     Head: Normocephalic and atraumatic.     Mouth/Throat:     Pharynx: No oropharyngeal exudate.  Eyes:     Pupils: Pupils are equal, round, and reactive to light.  Cardiovascular:     Rate and Rhythm: Normal rate and regular rhythm.  Pulmonary:     Effort: Pulmonary effort is normal. No respiratory distress.     Breath sounds: Normal breath sounds. No wheezing.  Abdominal:     General: Bowel sounds are normal. There is no distension.     Palpations: Abdomen is soft. There is no mass.     Tenderness: There is no abdominal tenderness. There is no guarding or rebound.  Musculoskeletal:        General: No tenderness. Normal range of motion.     Cervical back: Normal range of motion and neck supple.  Skin:    General: Skin is warm.  Neurological:     Mental Status: She is alert and oriented to person, place, and time.  Psychiatric:        Mood and Affect: Affect normal.    LABORATORY DATA:  I have reviewed the data as listed Lab Results  Component Value Date   WBC 6.3 03/05/2020   HGB 14.6 03/05/2020   HCT 44.2 03/05/2020   MCV 81.3 03/05/2020   PLT 285 03/05/2020   Recent Labs    03/31/19 0541 04/01/19 0156 04/03/19 0230 04/04/19 0234 10/30/19 0852 02/04/20 0948 03/05/20 1021  NA 138   < > 140 138  137  --  137  K 3.9   < > 3.6  3.7 3.8  --  3.2*  CL 104   < > 105 106 100  --  99  CO2 27   < > 25 25 26   --  29  GLUCOSE 93   < > 122* 101* 101*  --  97  BUN 12   < > 7* 7* 13  --  11  CREATININE 0.96   < > 0.71 0.74 0.89 1.00 0.91  CALCIUM 8.2*   < > 8.6* 8.4* 9.0  --  8.9  GFRNONAA >60   < > >60 >60 >60  --  >60  GFRAA >60   < > >60 >60 >60  --   --   PROT 5.3*  --   --   --  7.4  --  7.1  ALBUMIN 2.4*  --   --   --  3.9  --  3.9  AST 19  --   --   --  27  --  25  ALT 13  --   --   --  14  --  15  ALKPHOS 58  --   --   --  66  --  70  BILITOT 0.6  --   --   --  0.6  --  0.7   < > = values in this interval not displayed.    RADIOGRAPHIC STUDIES: I have personally reviewed the radiological images as listed and agreed with the findings in the report. No results found.  ASSESSMENT & PLAN:   Malignant neoplasm of right upper lobe of lung ([place]) # RIGHT UPPER LOBE LUNG CA-stage I-status post resection clear margins; DEC 21st, 2021-No evidence of recurrent or metastatic disease in the chest;  An irregular nodule in the peripheral right lower lobe is stable or slightly diminished in size compared to prior examination, measuring 4 mm. See below.  Stable.  #4-5 mm right lower lobe nodule-spiculated-unchanged on imaging in December 2021.  We will repeat imaging again in 6 months.   # Episodic fatigue/headaches/joint pain/muscle pain-unclear etiology.  Question fibromyalgia versus others.  Defer to PCP/neurology appointment next week.  #  Patient is to unvaccinated to [place].   # DISPOSITION: # follow up in 6 months; MD; labs- cbc/cmp;CT chest prior- Dr.B  # I reviewed the blood work- with the patient in detail; also reviewed the imaging independently [as summarized above]; and with the patient in detail.      All questions were answered. The patient knows to call the clinic with any problems, questions or concerns.    Cammie Sickle, MD 03/05/2020 4:26 PM

## 2020-03-05 NOTE — Assessment & Plan Note (Addendum)
#   RIGHT UPPER LOBE LUNG CA-stage I-status post resection clear margins; DEC 21st, 2021-No evidence of recurrent or metastatic disease in the chest;  An irregular nodule in the peripheral right lower lobe is stable or slightly diminished in size compared to prior examination, measuring 4 mm. See below.  Stable.  #4-5 mm right lower lobe nodule-spiculated-unchanged on imaging in December 2021.  We will repeat imaging again in 6 months.   # Episodic fatigue/headaches/joint pain/muscle pain-unclear etiology.  Question fibromyalgia versus others.  Defer to PCP/neurology appointment next week.  #  Patient is to unvaccinated to [place].   # DISPOSITION: # follow up in 6 months; MD; labs- cbc/cmp;CT chest prior- Dr.B  # I reviewed the blood work- with the patient in detail; also reviewed the imaging independently [as summarized above]; and with the patient in detail.

## 2020-03-12 DIAGNOSIS — E538 Deficiency of other specified B group vitamins: Secondary | ICD-10-CM | POA: Insufficient documentation

## 2020-03-12 DIAGNOSIS — I700 Atherosclerosis of aorta: Secondary | ICD-10-CM | POA: Insufficient documentation

## 2020-03-18 ENCOUNTER — Other Ambulatory Visit: Payer: Self-pay | Admitting: Neurology

## 2020-03-18 DIAGNOSIS — H93A3 Pulsatile tinnitus, bilateral: Secondary | ICD-10-CM

## 2020-03-30 ENCOUNTER — Other Ambulatory Visit: Payer: Self-pay

## 2020-03-30 ENCOUNTER — Ambulatory Visit
Admission: RE | Admit: 2020-03-30 | Discharge: 2020-03-30 | Disposition: A | Discharge status: Home / Self Care | Payer: Medicare HMO | Source: Ambulatory Visit | Attending: Neurology | Admitting: Neurology

## 2020-03-30 DIAGNOSIS — H93A3 Pulsatile tinnitus, bilateral: Secondary | ICD-10-CM

## 2020-03-30 MED ORDER — GADOBUTROL 1 MMOL/ML IV SOLN
10.0000 mL | Freq: Once | INTRAVENOUS | Status: AC | PRN
  Administered 2020-03-30: 10 mL via INTRAVENOUS

## 2020-05-12 ENCOUNTER — Ambulatory Visit: Payer: Medicare HMO | Admitting: Thoracic Surgery (Cardiothoracic Vascular Surgery)

## 2020-06-30 ENCOUNTER — Other Ambulatory Visit: Payer: Self-pay | Admitting: *Deleted

## 2020-06-30 DIAGNOSIS — C3411 Malignant neoplasm of upper lobe, right bronchus or lung: Secondary | ICD-10-CM

## 2020-07-03 ENCOUNTER — Ambulatory Visit
Admission: RE | Admit: 2020-07-03 | Discharge: 2020-07-03 | Disposition: A | Discharge status: Home / Self Care | Payer: Medicare HMO | Source: Ambulatory Visit | Attending: Internal Medicine | Admitting: Internal Medicine

## 2020-07-03 ENCOUNTER — Other Ambulatory Visit: Payer: Self-pay

## 2020-07-03 DIAGNOSIS — C3411 Malignant neoplasm of upper lobe, right bronchus or lung: Secondary | ICD-10-CM | POA: Insufficient documentation

## 2020-07-03 LAB — POCT I-STAT CREATININE: Creatinine, Ser: 0.9 mg/dL (ref 0.44–1.00)

## 2020-07-03 MED ORDER — IOHEXOL 300 MG/ML  SOLN
75.0000 mL | Freq: Once | INTRAMUSCULAR | Status: AC | PRN
  Administered 2020-07-03: 75 mL via INTRAVENOUS

## 2020-07-06 ENCOUNTER — Inpatient Hospital Stay: Payer: Medicare HMO | Admitting: Internal Medicine

## 2020-07-06 ENCOUNTER — Inpatient Hospital Stay: Payer: Medicare HMO

## 2020-07-06 ENCOUNTER — Telehealth: Payer: Self-pay | Admitting: Internal Medicine

## 2020-07-06 NOTE — Assessment & Plan Note (Deleted)
#   RIGHT UPPER LOBE LUNG CA-stage I-status post resection clear margins; DEC 21st, 2021-No evidence of recurrent or metastatic disease in the chest;  An irregular nodule in the peripheral right lower lobe is stable or slightly diminished in size compared to prior examination, measuring 4 mm. See below.  Stable.  #4-5 mm right lower lobe nodule-spiculated-unchanged on imaging in December 2021.  We will repeat imaging again in 6 months.   # Episodic fatigue/headaches/joint pain/muscle pain-unclear etiology.  Question fibromyalgia versus others.  Defer to PCP/neurology appointment next week.  #  Patient is to unvaccinated to [place].   # DISPOSITION: # follow up in 6 months; MD; labs- cbc/cmp;CT chest prior- Dr.B  # I reviewed the blood work- with the patient in detail; also reviewed the imaging independently [as summarized above]; and with the patient in detail.

## 2020-07-06 NOTE — Progress Notes (Deleted)
[place] NOTE  Patient Care Team: Tracie Harrier, MD as PCP - General (Internal Medicine) Telford Nab, RN as Oncology Nurse Navigator [name], MD as Consulting Physician (Cardiothoracic Surgery) Cammie Sickle, MD as Consulting Physician (Internal Medicine) Nestor Lewandowsky, MD as Referring Physician (Cardiothoracic Surgery)  CHIEF COMPLAINTS/PURPOSE OF CONSULTATION: lung cancer  #  Oncology History Overview Note  # JAN 2021- ADENOCA [Dr.A; [place] 2021-]DEC 2020- 13 mm lobular somewhat branching right upper lobe lesion is not hypermetabolic.Number of Lymph Nodes Examined: 17 ; Pathologic Stage Classification (pTNM, AJCC 8th Edition): pT1c, pN0 [Dr.Henderickson]; STAGE I-adenocarcinoma; no adjuvant therapy  # SURVIVORSHIP: P  # GENETICS: NA  DIAGNOSIS: lung cancer  STAGE:  I       ;  GOALS: cure  CURRENT/MOST RECENT THERAPY : Surveillance    Malignant neoplasm of right upper lobe of lung ([place])  03/08/2019 Initial Diagnosis   Malignant neoplasm of right upper lobe of lung ([place])   04/03/2019 Cancer Staging   Staging form: Lung, AJCC 8th Edition - Clinical stage from 04/03/2019: ycT1, cN0, cM0 - Signed by [name], MD on 04/03/2019      HISTORY OF PRESENTING ILLNESS:  Glenda Gomez 70 y.o.  female with right upper lobe stage I lung cancer is here for follow-up/review results of the CT scan.  All of a sudden episodic extreme fatigue/ headaches; feels sleepy. One episode in 1 month or so. getting worse; lasting longer.  Patient also has multiple other complaints including pain in knees; bottom of the feet.  In the interim patient was also evaluated by ENT for vertigo.  Patient denies any new shortness of breath or cough.  Denies any worsening pain.   Review of Systems  Constitutional: Positive for malaise/fatigue. Negative for chills, diaphoresis, fever and weight loss.  HENT: Negative for nosebleeds and sore throat.    Eyes: Negative for double vision.  Respiratory: Negative for hemoptysis, sputum production, shortness of breath and wheezing.   Cardiovascular: Negative for chest pain, palpitations, orthopnea and leg swelling.  Gastrointestinal: Negative for abdominal pain, blood in stool, constipation, diarrhea, heartburn, melena, nausea and vomiting.  Genitourinary: Negative for dysuria, frequency and urgency.  Musculoskeletal: Positive for back pain and joint pain.  Skin: Negative.  Negative for itching and rash.  Neurological: Positive for dizziness, weakness and headaches. Negative for tingling and focal weakness.  Endo/Heme/Allergies: Does not bruise/bleed easily.  Psychiatric/Behavioral: Negative for depression. The patient is not nervous/anxious and does not have insomnia.      MEDICAL HISTORY:  Past Medical History:  Diagnosis Date  . Adenocarcinoma ([place])    right upper lobe  . Arthritis   . Chicken pox   . Dyspnea   . Environmental and seasonal allergies   . Fibromyalgia   . GERD (gastroesophageal reflux disease)   . Hemorrhoid   . Hypersomnia   . Hypertension   . Hypothyroidism   . Lung cancer ([place])   . Malignant neoplasm of right upper lobe of lung ([place]) 03/08/2019  . Myositis ossificans   . Osteoarthritis   . Thyroid disease     SURGICAL HISTORY: Past Surgical History:  Procedure Laterality Date  .  bengin tongue lesion     Benign  . ABDOMINAL HYSTERECTOMY    . BACK SURGERY    . bladder tack    . BREAST BIOPSY Left    neg  . CHOLECYSTECTOMY    . COLONOSCOPY WITH PROPOFOL N/A 03/24/2017   Procedure: COLONOSCOPY  WITH PROPOFOL;  Surgeon: Lollie Sails, MD;  Location[facility] ENDOSCOPY;  Service: Endoscopy;  Laterality: N/A;  . ESOPHAGOGASTRODUODENOSCOPY (EGD) WITH PROPOFOL N/A 03/24/2017   Procedure: ESOPHAGOGASTRODUODENOSCOPY (EGD) WITH PROPOFOL;  Surgeon: Lollie Sails, MD;  Location: [facility]COPY;  Service: Endoscopy;  Laterality: N/A;  . GASTRIC BYPASS OPEN    .  hemrrhoidectomy    . INTERCOSTAL NERVE BLOCK Right 03/29/2019   Procedure: Intercostal Nerve Block;  Surgeon: [name], MD;  Location: [place];  Service: Thoracic;  Laterality: Right;  . JOINT REPLACEMENT Bilateral    TKR  . KNEE ARTHROPLASTY Right 07/06/2016   Procedure: COMPUTER ASSISTED TOTAL KNEE ARTHROPLASTY;  Surgeon: Dereck Leep, MD;  Location: [facility] ORS;  Service: Orthopedics;  Laterality: Right;  . KNEE ARTHROSCOPY    . LOBECTOMY Right 03/29/2019   XI ROBOTIC ASSISTED THORASCOPY- RIGHT UPPER LOBECTOMY (Right Chest)  . NODE DISSECTION  03/29/2019   Procedure: Node Dissection;  Surgeon: [name], MD;  Location: [place];  Service: Thoracic;;  . plantar fascia Left   . SHOULDER ARTHROSCOPY WITH DEBRIDEMENT AND BICEP TENDON REPAIR Left 09/25/2018   Procedure: SHOULDER ARTHROSCOPY WITH DEBRIDEMENT DECOMPRESSION AND BICEP TENDON REPAIR - SCRUB TECH;  Surgeon: Corky Mull, MD;  Location: [facility] ORS;  Service: Orthopedics;  Laterality: Left;  . TONSILLECTOMY    . VIDEO BRONCHOSCOPY WITH ENDOBRONCHIAL NAVIGATION N/A 03/04/2019   Procedure: VIDEO BRONCHOSCOPY WITH ENDOBRONCHIAL NAVIGATION;  Surgeon: Ottie Glazier, MD;  Location: [facility] ORS;  Service: Thoracic;  Laterality: N/A;  . VIDEO BRONCHOSCOPY WITH ENDOBRONCHIAL ULTRASOUND N/A 03/04/2019   Procedure: VIDEO BRONCHOSCOPY WITH ENDOBRONCHIAL ULTRASOUND;  Surgeon: Ottie Glazier, MD;  Location: [facility] ORS;  Service: Thoracic;  Laterality: N/A;    SOCIAL HISTORY: Social History   Socioeconomic History  . Marital status: Divorced    Spouse name: Not on file  . Number of children: Not on file  . Years of education: Not on file  . Highest education level: Not on file  Occupational History  . Not on file  Tobacco Use  . Smoking status: Former Smoker    Packs/day: 2.00    Years: 10.00    Pack years: 20.00    Quit date: 07/17/1993    Years since quitting: 26.9  . Smokeless tobacco: Never Used  Vaping Use  . Vaping  Use: Never used  Substance and Sexual Activity  . Alcohol use: No  . Drug use: No  . Sexual activity: Not on file  Other Topics Concern  . Not on file  Social History Narrative   1ppd- quit > 20 years ago; no alcohol; in [place]; lives self. retd- cook.    Social Determinants of Health   Financial Resource Strain: Not on file  Food Insecurity: Not on file  Transportation Needs: Not on file  Physical Activity: Not on file  Stress: Not on file  Social Connections: Not on file  Intimate Partner Violence: Not on file    FAMILY HISTORY: Family History  Problem Relation Age of Onset  . Breast cancer Sister 29  . Breast cancer Maternal Aunt 80  . Lung cancer Maternal Aunt   . Breast cancer Sister 53  . Alzheimer's disease Mother   . Arthritis Mother   . Diabetes Mother   . Heart failure Brother   . Pancreatic cancer Brother 53  . Lung cancer Brother   . Liver cancer Father     ALLERGIES:  is allergic to flagyl [metronidazole] and pregabalin.  MEDICATIONS:  Current Outpatient Medications  Medication Sig Dispense Refill  . celecoxib (CELEBREX) 200 MG capsule Take 1 capsule (200 mg total) by mouth 2 (two) times daily with a meal. (Patient taking differently: Take 200-400 mg by mouth daily.) 60 capsule 1  . estradiol (ESTRACE) 0.5 MG tablet Take 0.5 mg by mouth daily.    . fluticasone (FLONASE) 50 MCG/ACT nasal spray Place 2 sprays into both nostrils daily as needed for allergies or rhinitis.     . furosemide (LASIX) 20 MG tablet Take 20 mg by mouth daily as needed for fluid or edema.     Marland Kitchen levothyroxine (SYNTHROID) 88 MCG tablet Take 88 mcg by mouth daily.    Marland Kitchen omeprazole (PRILOSEC) 20 MG capsule Take 20 mg by mouth 2 (two) times daily before a meal.     . phentermine (ADIPEX-P) 37.5 MG tablet Take 37.5 mg by mouth at bedtime.    . triamterene-hydrochlorothiazide (MAXZIDE-25) 37.5-25 MG tablet Take 1 tablet by mouth daily.    . VENTOLIN HFA 108 (90 Base) MCG/ACT inhaler Inhale  2 puffs into the lungs every 6 (six) hours as needed for wheezing or shortness of breath.     . Vitamin D, Ergocalciferol, (DRISDOL) 1.25 MG (50000 UNIT) CAPS capsule Take 50,000 Units by mouth once a week.     . zolpidem (AMBIEN) 5 MG tablet Take 5-7.5 mg by mouth at bedtime.     No current facility-administered medications for this visit.      Marland Kitchen  PHYSICAL EXAMINATION: ECOG PERFORMANCE STATUS: 0 - Asymptomatic  There were no vitals filed for this visit. There were no vitals filed for this visit.  Physical Exam Constitutional:      Comments: Obese. She is walking herself.  HENT:     Head: Normocephalic and atraumatic.     Mouth/Throat:     Pharynx: No oropharyngeal exudate.  Eyes:     Pupils: Pupils are equal, round, and reactive to light.  Cardiovascular:     Rate and Rhythm: Normal rate and regular rhythm.  Pulmonary:     Effort: Pulmonary effort is normal. No respiratory distress.     Breath sounds: Normal breath sounds. No wheezing.  Abdominal:     General: Bowel sounds are normal. There is no distension.     Palpations: Abdomen is soft. There is no mass.     Tenderness: There is no abdominal tenderness. There is no guarding or rebound.  Musculoskeletal:        General: No tenderness. Normal range of motion.     Cervical back: Normal range of motion and neck supple.  Skin:    General: Skin is warm.  Neurological:     Mental Status: She is alert and oriented to person, place, and time.  Psychiatric:        Mood and Affect: Affect normal.    LABORATORY DATA:  I have reviewed the data as listed Lab Results  Component Value Date   WBC 6.3 03/05/2020   HGB 14.6 03/05/2020   HCT 44.2 03/05/2020   MCV 81.3 03/05/2020   PLT 285 03/05/2020   Recent Labs    10/30/19 0852 02/04/20 0948 03/05/20 1021 07/03/20 1056  NA 137  --  137  --   K 3.8  --  3.2*  --   CL 100  --  99  --   CO2 26  --  29  --   GLUCOSE 101*  --  97  --   BUN 13  --  11  --   CREATININE  0.89 1.00 0.91 0.90  CALCIUM 9.0  --  8.9  --   GFRNONAA >60  --  >60  --   GFRAA >60  --   --   --   PROT 7.4  --  7.1  --   ALBUMIN 3.9  --  3.9  --   AST 27  --  25  --   ALT 14  --  15  --   ALKPHOS 66  --  70  --   BILITOT 0.6  --  0.7  --     RADIOGRAPHIC STUDIES: I have personally reviewed the radiological images as listed and agreed with the findings in the report. CT Chest W Contrast  Result Date: 07/05/2020 CLINICAL DATA:  Right lung cancer, status post right upper lobectomy in 2021, shortness of breath, cough EXAM: CT CHEST WITH CONTRAST TECHNIQUE: Multidetector CT imaging of the chest was performed during intravenous contrast administration. CONTRAST:  65mL OMNIPAQUE IOHEXOL 300 MG/ML  SOLN COMPARISON:  02/04/2020 FINDINGS: Cardiovascular: Heart is normal in size.  No pericardial effusion. No evidence of thoracic aortic aneurysm. Atherosclerotic calcifications of the aortic arch. Mild coronary atherosclerosis of the left circumflex. Mediastinum/Nodes: No suspicious mediastinal lymphadenopathy. Visualized thyroid is unremarkable. Lungs/Pleura: Status post right upper lobectomy. Stable 4 mm nodule in the lateral right lower lobe (series 3/image 66). Stable 3 mm subpleural nodule in the lingula (series 3/image 33). Stable 3 mm subpleural nodule in the posterior left lower lobe (series 3/image 80). These can be considered benign. No new/suspicious pulmonary nodules. No focal consolidation. No pleural effusion or pneumothorax. Upper Abdomen: Visualized upper abdomen is notable for prior cholecystectomy, postsurgical changes related to gastric bypass, and a stable 2.5 cm exophytic cyst along the right upper kidney (series 2/image 117). Musculoskeletal: Visualized osseous structures are within normal limits. IMPRESSION: Status post right upper lobectomy. No evidence of recurrent or metastatic disease. Aortic Atherosclerosis (ICD10-I70.0). Electronically Signed   By: Julian Hy M.D.   On:  07/05/2020 13:39    ASSESSMENT & PLAN:   Malignant neoplasm of right upper lobe of lung ([place]) # RIGHT UPPER LOBE LUNG CA-stage I-status post resection clear margins; DEC 21st, 2021-No evidence of recurrent or metastatic disease in the chest;  An irregular nodule in the peripheral right lower lobe is stable or slightly diminished in size compared to prior examination, measuring 4 mm. See below.  Stable.  #4-5 mm right lower lobe nodule-spiculated-unchanged on imaging in December 2021.  We will repeat imaging again in 6 months.   # Episodic fatigue/headaches/joint pain/muscle pain-unclear etiology.  Question fibromyalgia versus others.  Defer to PCP/neurology appointment next week.  #  Patient is to unvaccinated to [place].   # DISPOSITION: # follow up in 6 months; MD; labs- cbc/cmp;CT chest prior- Dr.B  # I reviewed the blood work- with the patient in detail; also reviewed the imaging independently [as summarized above]; and with the patient in detail.      All questions were answered. The patient knows to call the clinic with any problems, questions or concerns.    Cammie Sickle, MD 07/06/2020 8:32 AM

## 2020-07-06 NOTE — Telephone Encounter (Signed)
Patient called and is not feeling well.  Rescheduled her 5/23 appointment to 5/27 in [place] with lab/md.  She will call back on Thursday if she is still not feeling well.

## 2020-07-07 ENCOUNTER — Encounter: Payer: Self-pay | Admitting: Internal Medicine

## 2020-07-07 ENCOUNTER — Telehealth: Payer: Self-pay

## 2020-07-07 NOTE — Telephone Encounter (Signed)
Patient has tested positive for COVID. Has appt with Dr B on Friday in [place]. FYI

## 2020-07-10 ENCOUNTER — Other Ambulatory Visit: Payer: Medicare HMO

## 2020-07-10 ENCOUNTER — Ambulatory Visit: Payer: Medicare HMO | Admitting: Internal Medicine

## 2020-07-22 ENCOUNTER — Ambulatory Visit
Admission: RE | Admit: 2020-07-22 | Discharge: 2020-07-22 | Disposition: A | Discharge status: Home / Self Care | Payer: Medicare HMO | Attending: Internal Medicine | Admitting: Internal Medicine

## 2020-07-22 ENCOUNTER — Encounter
Admission: RE | Disposition: A | Discharge status: Home / Self Care | Payer: Self-pay | Source: Home / Self Care | Attending: Internal Medicine

## 2020-07-22 ENCOUNTER — Encounter: Payer: Self-pay | Admitting: Internal Medicine

## 2020-07-22 ENCOUNTER — Ambulatory Visit: Payer: Medicare HMO | Admitting: Anesthesiology

## 2020-07-22 DIAGNOSIS — Z7982 Long term (current) use of aspirin: Secondary | ICD-10-CM | POA: Insufficient documentation

## 2020-07-22 DIAGNOSIS — Z1211 Encounter for screening for malignant neoplasm of colon: Secondary | ICD-10-CM | POA: Diagnosis present

## 2020-07-22 DIAGNOSIS — D120 Benign neoplasm of cecum: Secondary | ICD-10-CM | POA: Diagnosis not present

## 2020-07-22 DIAGNOSIS — Z7951 Long term (current) use of inhaled steroids: Secondary | ICD-10-CM | POA: Insufficient documentation

## 2020-07-22 DIAGNOSIS — Z85118 Personal history of other malignant neoplasm of bronchus and lung: Secondary | ICD-10-CM | POA: Diagnosis not present

## 2020-07-22 DIAGNOSIS — Z902 Acquired absence of lung [part of]: Secondary | ICD-10-CM | POA: Insufficient documentation

## 2020-07-22 DIAGNOSIS — Z79899 Other long term (current) drug therapy: Secondary | ICD-10-CM | POA: Diagnosis not present

## 2020-07-22 DIAGNOSIS — Z888 Allergy status to other drugs, medicaments and biological substances status: Secondary | ICD-10-CM | POA: Insufficient documentation

## 2020-07-22 DIAGNOSIS — Z9884 Bariatric surgery status: Secondary | ICD-10-CM | POA: Insufficient documentation

## 2020-07-22 DIAGNOSIS — Z791 Long term (current) use of non-steroidal anti-inflammatories (NSAID): Secondary | ICD-10-CM | POA: Insufficient documentation

## 2020-07-22 DIAGNOSIS — K640 First degree hemorrhoids: Secondary | ICD-10-CM | POA: Insufficient documentation

## 2020-07-22 DIAGNOSIS — D124 Benign neoplasm of descending colon: Secondary | ICD-10-CM | POA: Insufficient documentation

## 2020-07-22 DIAGNOSIS — Z881 Allergy status to other antibiotic agents status: Secondary | ICD-10-CM | POA: Diagnosis not present

## 2020-07-22 DIAGNOSIS — K2100 Gastro-esophageal reflux disease with esophagitis, without bleeding: Secondary | ICD-10-CM | POA: Diagnosis not present

## 2020-07-22 DIAGNOSIS — K2270 Barrett's esophagus without dysplasia: Secondary | ICD-10-CM | POA: Diagnosis not present

## 2020-07-22 DIAGNOSIS — K2950 Unspecified chronic gastritis without bleeding: Secondary | ICD-10-CM | POA: Insufficient documentation

## 2020-07-22 DIAGNOSIS — Z79890 Hormone replacement therapy (postmenopausal): Secondary | ICD-10-CM | POA: Diagnosis not present

## 2020-07-22 HISTORY — PX: COLONOSCOPY WITH PROPOFOL: SHX5780

## 2020-07-22 HISTORY — PX: ESOPHAGOGASTRODUODENOSCOPY (EGD) WITH PROPOFOL: SHX5813

## 2020-07-22 SURGERY — COLONOSCOPY WITH PROPOFOL
Anesthesia: General

## 2020-07-22 MED ORDER — LIDOCAINE HCL (CARDIAC) PF 100 MG/5ML IV SOSY
PREFILLED_SYRINGE | INTRAVENOUS | Status: DC | PRN
  Administered 2020-07-22: 100 mg via INTRAVENOUS

## 2020-07-22 MED ORDER — PROPOFOL 10 MG/ML IV BOLUS
INTRAVENOUS | Status: DC | PRN
  Administered 2020-07-22: 20 mg via INTRAVENOUS
  Administered 2020-07-22: 100 mg via INTRAVENOUS

## 2020-07-22 MED ORDER — PROPOFOL 500 MG/50ML IV EMUL
INTRAVENOUS | Status: AC
  Filled 2020-07-22: qty 50

## 2020-07-22 MED ORDER — SODIUM CHLORIDE 0.9 % IV SOLN
INTRAVENOUS | Status: DC
  Administered 2020-07-22: 1000 mL via INTRAVENOUS

## 2020-07-22 MED ORDER — PROPOFOL 500 MG/50ML IV EMUL
INTRAVENOUS | Status: DC | PRN
  Administered 2020-07-22: 150 ug/kg/min via INTRAVENOUS

## 2020-07-22 NOTE — Transfer of Care (Signed)
Immediate Anesthesia Transfer of Care Note  Patient: Glenda Gomez  Procedure(s) Performed: COLONOSCOPY WITH PROPOFOL (N/A ) ESOPHAGOGASTRODUODENOSCOPY (EGD) WITH PROPOFOL (N/A )  Patient Location: PACU  Anesthesia Type:General  Level of Consciousness: awake  Airway & Oxygen Therapy: Patient Spontanous Breathing  Post-op Assessment: Post -op Vital signs reviewed and stable  Post vital signs: stable  Last Vitals:  Vitals Value Taken Time  BP 119/75 07/22/20 1340  Temp    Pulse 60 07/22/20 1340  Resp 16 07/22/20 1340  SpO2 100 % 07/22/20 1340  Vitals shown include unvalidated device data.  Last Pain:  Vitals:   07/22/20 1208  PainSc: 3       Patients Stated Pain Goal: 0 (51/83/43 [id number])  Complications: No complications documented.

## 2020-07-22 NOTE — H&P (Signed)
Outpatient short stay form Pre-procedure 07/22/2020 12:53 PM Ocean Kearley K. Alice Reichert, M.D.  Primary Physician: Tracie Harrier, M.D.  Reason for visit:  GERD, esophagitis, RUQ pain, Barrett's esophagus, hx of colon polyps (2019 - adenoma x 4).  History of present illness: 70 y/o female with hx of lung ca s/p lul lobectomy with symptoms of generalized abdominal pain, constipaton, GERD and Barrett's esophags. - EGD: 01/01/2014 - short-segment Barrett's esophagus, moderate chronic active H pylori gastritis negative for dysplasia, normal duodenum  - CSY: 03/24/2017 - left-sided colonic diverticulosis, 5 polyps removed with path showing tubular adenoma x4 and hyperplastic polyp - EGD: 03/24/2017 - LA grade B reflux esophagitis, atypical postoperative anatomy. Antral biopsies with reactive gastritis, fundic gland polyp and cardia, mild chronic nonspecific gastritis biopsies negative for Barrett's esophagus. GEJ biopsies positive for reflux gastroesophagitis.    Current Facility-Administered Medications:  .  0.9 %  sodium chloride infusion, , Intravenous, Continuous, [place], Benay Pike, MD, Last Rate: 20 mL/hr at 07/22/20 1232, 1,000 mL at 07/22/20 1232  Medications Prior to Admission  Medication Sig Dispense Refill Last Dose  . ascorbic acid (VITAMIN C) 1000 MG tablet Take 1,000 mg by mouth daily.   07/21/2020 at Unknown time  . celecoxib (CELEBREX) 200 MG capsule Take 1 capsule (200 mg total) by mouth 2 (two) times daily with a meal. (Patient taking differently: Take 200-400 mg by mouth daily.) 60 capsule 1 07/21/2020 at Unknown time  . estradiol (ESTRACE) 0.5 MG tablet Take 0.5 mg by mouth daily.   07/21/2020 at Unknown time  . furosemide (LASIX) 20 MG tablet Take 20 mg by mouth daily as needed for fluid or edema.    07/21/2020 at Unknown time  . levothyroxine (SYNTHROID) 88 MCG tablet Take 88 mcg by mouth daily.   07/21/2020 at Unknown time  . omeprazole (PRILOSEC) 20 MG capsule Take 20 mg by mouth 2 (two) times  daily before a meal.    07/21/2020 at Unknown time  . Semaglutide (OZEMPIC, 0.25 OR 0.5 MG/DOSE, ) Inject into the skin.   07/21/2020 at Unknown time  . triamterene-hydrochlorothiazide (MAXZIDE-25) 37.5-25 MG tablet Take 1 tablet by mouth daily.   07/21/2020 at Unknown time  . Vitamin D, Ergocalciferol, (DRISDOL) 1.25 MG (50000 UNIT) CAPS capsule Take 50,000 Units by mouth once a week.    Past Week at Unknown time  . zolpidem (AMBIEN) 5 MG tablet Take 5-7.5 mg by mouth at bedtime.   07/21/2020 at Unknown time  . aspirin EC 81 MG tablet Take 81 mg by mouth daily. Swallow whole. (Patient not taking: Reported on 07/22/2020)   Not Taking at Unknown time  . fluticasone (FLONASE) 50 MCG/ACT nasal spray Place 2 sprays into both nostrils daily as needed for allergies or rhinitis.    prn  . phentermine (ADIPEX-P) 37.5 MG tablet Take 37.5 mg by mouth at bedtime. (Patient not taking: Reported on 07/22/2020)   Not Taking at Unknown time  . traZODone (DESYREL) 50 MG tablet Take 50 mg by mouth at bedtime. (Patient not taking: Reported on 07/22/2020)   Not Taking at Unknown time  . VENTOLIN HFA 108 (90 Base) MCG/ACT inhaler Inhale 2 puffs into the lungs every 6 (six) hours as needed for wheezing or shortness of breath.    prn     Allergies  Allergen Reactions  . Flagyl [Metronidazole] Diarrhea and Nausea And Vomiting    PASS OUT  . Phentermine Other (See Comments)    Abdominal pain  . Pregabalin Other (See Comments)    "  dizziness"     Past Medical History:  Diagnosis Date  . Adenocarcinoma ([place])    right upper lobe  . Arthritis   . Chicken pox   . Dyspnea   . Environmental and seasonal allergies   . Fibromyalgia   . GERD (gastroesophageal reflux disease)   . Hemorrhoid   . Hypersomnia   . Hypertension   . Hypothyroidism   . Lung cancer ([place])   . Malignant neoplasm of right upper lobe of lung ([place]) 03/08/2019  . Myositis ossificans   . Osteoarthritis   . Thyroid disease     Review of systems:   Otherwise negative.    Physical Exam  Gen: Alert, oriented. Appears stated age.  HEENT: /AT. PERRLA. Lungs: CTA, no wheezes. CV: RR nl S1, S2. Abd: soft, benign, no masses. BS+ Ext: No edema. Pulses 2+    Planned procedures: Proceed with EGD and colonoscopy. The patient understands the nature of the planned procedure, indications, risks, alternatives and potential complications including but not limited to bleeding, infection, perforation, damage to internal organs and possible oversedation/side effects from anesthesia. The patient agrees and gives consent to proceed.  Please refer to procedure notes for findings, recommendations and patient disposition/instructions.     Gema Ringold K. Alice Reichert, M.D. Gastroenterology 07/22/2020  12:53 PM

## 2020-07-22 NOTE — Op Note (Signed)
[facility] Patient Name: Glenda Gomez Procedure Date: 07/22/2020 12:54 PM MRN: [id number] Account #: [id number] Date of Birth: 1950/04/03 Admit Type: Outpatient Age: 70 Room: [facility] ENDO ROOM 2 Gender: Female Note Status: Finalized Procedure:             Colonoscopy Indications:           High risk colon cancer surveillance: Personal history                         of multiple (3 or more) adenomas Providers:             Lorie Apley K. Alice Reichert MD, MD Referring MD:          Tracie Harrier, MD (Referring MD) Medicines:             Propofol per Anesthesia Complications:         No immediate complications. Procedure:             Pre-Anesthesia Assessment:                        - The risks and benefits of the procedure and the                         sedation options and risks were discussed with the                         patient. All questions were answered and informed                         consent was obtained.                        - Patient identification and proposed procedure were                         verified prior to the procedure by the nurse. The                         procedure was verified in the procedure room.                        - ASA Grade Assessment: III - A patient with severe                         systemic disease.                        - After reviewing the risks and benefits, the patient                         was deemed in satisfactory condition to undergo the                         procedure.                        After obtaining informed consent, the colonoscope was                         passed under direct vision.  Throughout the procedure,                         the patient's blood pressure, pulse, and oxygen                         saturations were monitored continuously. The                         Colonoscope was introduced through the anus and                         advanced to the the cecum, identified by  appendiceal                         orifice and ileocecal valve. The colonoscopy was                         performed without difficulty. The patient tolerated                         the procedure well. The quality of the bowel                         preparation was adequate. The ileocecal valve,                         appendiceal orifice, and rectum were photographed. Findings:      The perianal and digital rectal examinations were normal. Pertinent       negatives include normal sphincter tone and no palpable rectal lesions.      Non-bleeding internal hemorrhoids were found during retroflexion. The       hemorrhoids were Grade I (internal hemorrhoids that do not prolapse).      Two sessile polyps were found in the descending colon and cecum. The       polyps were 4 to 7 mm in size. These polyps were removed with a jumbo       cold forceps. Resection and retrieval were complete.      The exam was otherwise without abnormality. Impression:            - Non-bleeding internal hemorrhoids.                        - Two 4 to 7 mm polyps in the descending colon and in                         the cecum, removed with a jumbo cold forceps. Resected                         and retrieved.                        - The examination was otherwise normal. Recommendation:        - Await pathology results from EGD, also performed                         today.                        -  Patient has a contact number available for                         emergencies. The signs and symptoms of potential                         delayed complications were discussed with the patient.                         Return to normal activities tomorrow. Written                         discharge instructions were provided to the patient.                        - Resume previous diet.                        - Continue present medications.                        - Repeat colonoscopy is recommended for surveillance.                          The colonoscopy date will be determined after                         pathology results from today's exam become available                         for review.                        - Return to physician assistant in 3 months.                        - The findings and recommendations were discussed with                         the patient. Procedure Code(s):     --- Professional ---                        [phone], Colonoscopy, flexible; with biopsy, single or                         multiple Diagnosis Code(s):     --- Professional ---                        K64.0, First degree hemorrhoids                        K63.5, Polyp of colon                        Z86.010, Personal history of colonic polyps CPT copyright 2019 American Medical Association. All rights reserved. The codes documented in this report are preliminary and upon coder review may  be revised to meet current compliance requirements. Efrain Sella MD, MD 07/22/2020 1:38:31 PM This report has been signed electronically. Number of Addenda: 0 Note Initiated On: 07/22/2020 12:54  PM Scope Withdrawal Time: 0 hours 6 minutes 40 seconds  Total Procedure Duration: 0 hours 11 minutes 44 seconds  Estimated Blood Loss:  Estimated blood loss: none.      [facility]

## 2020-07-22 NOTE — Interval H&P Note (Signed)
History and Physical Interval Note:  07/22/2020 12:58 PM  Glenda Gomez  has presented today for surgery, with the diagnosis of HX ADEN POLYPS GERD HIATAL HERNIA RUQ ABDOMINAL PAIN.  The various methods of treatment have been discussed with the patient and family. After consideration of risks, benefits and other options for treatment, the patient has consented to  Procedure(s): COLONOSCOPY WITH PROPOFOL (N/A) ESOPHAGOGASTRODUODENOSCOPY (EGD) WITH PROPOFOL (N/A) as a surgical intervention.  The patient's history has been reviewed, patient examined, no change in status, stable for surgery.  I have reviewed the patient's chart and labs.  Questions were answered to the patient's satisfaction.     [place], [place]

## 2020-07-22 NOTE — Op Note (Signed)
[facility] Gastroenterology Patient Name: Glenda Gomez Procedure Date: 07/22/2020 12:56 PM MRN: [id number] Account #: [id number] Date of Birth: 1950/10/18 Admit Type: Outpatient Age: 70 Room: [facility] ENDO ROOM 2 Gender: Female Note Status: Finalized Procedure:             Upper GI endoscopy Indications:           Surveillance for malignancy due to personal history of                         Barrett's esophagus, Abdominal pain in the right upper                         quadrant, Gastro-esophageal reflux disease Providers:             Benay Pike. Alice Reichert MD, MD Referring MD:          Tracie Harrier, MD (Referring MD) Medicines:             Propofol per Anesthesia Complications:         No immediate complications. Procedure:             Pre-Anesthesia Assessment:                        - The risks and benefits of the procedure and the                         sedation options and risks were discussed with the                         patient. All questions were answered and informed                         consent was obtained.                        - Patient identification and proposed procedure were                         verified prior to the procedure by the nurse. The                         procedure was verified in the procedure room.                        - ASA Grade Assessment: III - A patient with severe                         systemic disease.                        - After reviewing the risks and benefits, the patient                         was deemed in satisfactory condition to undergo the                         procedure.                        After obtaining  informed consent, the endoscope was                         passed under direct vision. Throughout the procedure,                         the patient's blood pressure, pulse, and oxygen                         saturations were monitored continuously. The Endoscope                         was  introduced through the mouth, and advanced to the                         third part of duodenum. The upper GI endoscopy was                         accomplished without difficulty. The patient tolerated                         the procedure well. Findings:      There were esophageal mucosal changes secondary to established       short-segment Barrett's disease present at the gastroesophageal       junction. The maximum longitudinal extent of these mucosal changes was 2       cm in length. Mucosa was biopsied with a cold forceps for histology in 4       quadrants at the gastroesophageal junction. One specimen bottle was sent       to pathology.      Evidence of a patent vertical banded gastroplasty was found. A gastric       pouch with a medium size was found. The staple line appeared intact.       Evidence of a Silastic band was not seen and appeared loose. This was       traversed.      Diffuse mild inflammation characterized by congestion (edema) and       erythema was found in the gastric body and in the gastric antrum.       Biopsies were taken with a cold forceps for Helicobacter pylori testing.      The examined duodenum was normal.      The exam was otherwise without abnormality. Impression:            - Esophageal mucosal changes secondary to established                         short-segment Barrett's disease. Biopsied.                        - Patent vertical banded gastroplasty with a                         medium-sized pouch and intact staple line and band                         appears loose.                        - Gastritis. Biopsied.                        -  Normal examined duodenum.                        - The examination was otherwise normal. Recommendation:        - Await pathology results.                        - Proceed with colonoscopy Procedure Code(s):     --- Professional ---                        [phone], Esophagogastroduodenoscopy, flexible,                          transoral; with biopsy, single or multiple Diagnosis Code(s):     --- Professional ---                        K21.9, Gastro-esophageal reflux disease without                         esophagitis                        R10.11, Right upper quadrant pain                        K29.70, Gastritis, unspecified, without bleeding                        Z98.84, Bariatric surgery status                        K22.70, Barrett's esophagus without dysplasia CPT copyright 2019 [employer]. All rights reserved. The codes documented in this report are preliminary and upon coder review may  be revised to meet current compliance requirements. Efrain Sella MD, MD 07/22/2020 1:21:51 PM This report has been signed electronically. Number of Addenda: 0 Note Initiated On: 07/22/2020 12:56 PM Estimated Blood Loss:  Estimated blood loss: none.      [facility]

## 2020-07-22 NOTE — Anesthesia Preprocedure Evaluation (Signed)
Anesthesia Evaluation  Patient identified by MRN, date of birth, ID band Patient awake    Reviewed: Allergy & Precautions, H&P , NPO status , Patient's Chart, lab work & pertinent test results, reviewed documented beta blocker date and time   Airway Mallampati: II   Neck ROM: full    Dental  (+) Poor Dentition   Pulmonary shortness of breath, former smoker,    Pulmonary exam normal        Cardiovascular Exercise Tolerance: Poor hypertension, On Medications negative cardio ROS Normal cardiovascular exam Rhythm:regular Rate:Normal     Neuro/Psych  Neuromuscular disease negative psych ROS   GI/Hepatic Neg liver ROS, GERD  Medicated,  Endo/Other  Hypothyroidism   Renal/GU negative Renal ROS  negative genitourinary   Musculoskeletal   Abdominal   Peds  Hematology negative hematology ROS (+)   Anesthesia Other Findings Past Medical History: No date: 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 No date: Arthritis No date: Chicken pox No date: Dyspnea No date: Environmental and seasonal allergies No date: Fibromyalgia No date: GERD (gastroesophageal reflux disease) No date: Hemorrhoid No date: Hypersomnia No date: Hypertension No date: Hypothyroidism No date: Lung cancer ([place]) 03/08/2019: Malignant neoplasm of right upper lobe of lung ([facility]CC) 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 No date: Osteoarthritis No date: Thyroid disease Past Surgical History: No date:  bengin tongue lesion     Comment:  Benign No date: ABDOMINAL HYSTERECTOMY No date: BACK SURGERY No date: bladder tack No date: BREAST BIOPSY; Left     Comment:  neg No date: CHOLECYSTECTOMY 03/24/2017: COLONOSCOPY WITH PROPOFOL; N/A     Comment:  Procedure: COLONOSCOPY WITH PROPOFOL;  Surgeon:               Lollie Sails, MD;  Location: [facility] ENDOSCOPY;                Service: Endoscopy;  Laterality: N/A; 03/24/2017: ESOPHAGOGASTRODUODENOSCOPY (EGD)  WITH PROPOFOL; N/A     Comment:  Procedure: ESOPHAGOGASTRODUODENOSCOPY (EGD) WITH               PROPOFOL;  Surgeon: Lollie Sails, MD;  Location:               [facility] ENDOSCOPY;  Service: Endoscopy;  Laterality: N/A; No date: GASTRIC BYPASS OPEN No date: hemrrhoidectomy 03/29/2019: INTERCOSTAL NERVE BLOCK; Right     Comment:  Procedure: Intercostal Nerve Block;  Surgeon:               [name], MD;  Location: [place];  Service:               Thoracic;  Laterality: Right; No date: JOINT REPLACEMENT; Bilateral     Comment:  TKR 07/06/2016: KNEE ARTHROPLASTY; Right     Comment:  Procedure: COMPUTER ASSISTED TOTAL KNEE ARTHROPLASTY;                Surgeon: Dereck Leep, MD;  Location: [facility] ORS;                Service: Orthopedics;  Laterality: Right; No date: KNEE ARTHROSCOPY 03/29/2019: LOBECTOMY; Right     Comment:  XI ROBOTIC ASSISTED THORASCOPY- RIGHT UPPER LOBECTOMY               (Right Chest) 03/29/2019: NODE DISSECTION     Comment:  Procedure: Node Dissection;  Surgeon: [name], MD;  Location:  [facility] OR;  Service: Thoracic;; No date: plantar fascia; Left 09/25/2018: SHOULDER ARTHROSCOPY WITH DEBRIDEMENT AND BICEP TENDON  REPAIR; Left     Comment:  Procedure: SHOULDER ARTHROSCOPY WITH DEBRIDEMENT               DECOMPRESSION AND BICEP TENDON REPAIR - SCRUB TECH;                Surgeon: Corky Mull, MD;  Location: [facility] ORS;                Service: Orthopedics;  Laterality: Left; No date: TONSILLECTOMY 03/04/2019: VIDEO BRONCHOSCOPY WITH ENDOBRONCHIAL NAVIGATION; N/A     Comment:  Procedure: VIDEO BRONCHOSCOPY WITH ENDOBRONCHIAL               NAVIGATION;  Surgeon: Ottie Glazier, MD;  Location:               [facility] ORS;  Service: Thoracic;  Laterality: N/A; 03/04/2019: VIDEO BRONCHOSCOPY WITH ENDOBRONCHIAL ULTRASOUND; N/A     Comment:  Procedure: VIDEO BRONCHOSCOPY WITH ENDOBRONCHIAL               ULTRASOUND;  Surgeon: Ottie Glazier, MD;   Location:               [facility] ORS;  Service: Thoracic;  Laterality: N/A; BMI    Body Mass Index: 43.07 kg/m     Reproductive/Obstetrics negative OB ROS                             Anesthesia Physical Anesthesia Plan  ASA: III  Anesthesia Plan: General   Post-op Pain Management:    Induction:   PONV Risk Score and Plan:   Airway Management Planned:   Additional Equipment:   Intra-op Plan:   Post-operative Plan:   Informed Consent: I have reviewed the patients History and Physical, chart, labs and discussed the procedure including the risks, benefits and alternatives for the proposed anesthesia with the patient or authorized representative who has indicated his/her understanding and acceptance.     Dental Advisory Given  Plan Discussed with: CRNA  Anesthesia Plan Comments:         Anesthesia Quick Evaluation

## 2020-07-23 ENCOUNTER — Encounter: Payer: Self-pay | Admitting: Internal Medicine

## 2020-07-23 LAB — SURGICAL PATHOLOGY

## 2020-07-23 NOTE — Anesthesia Postprocedure Evaluation (Signed)
Anesthesia Post Note  Patient: Glenda Gomez  Procedure(s) Performed: COLONOSCOPY WITH PROPOFOL ESOPHAGOGASTRODUODENOSCOPY (EGD) WITH PROPOFOL  Patient location during evaluation: PACU Anesthesia Type: General Level of consciousness: awake and alert Pain management: pain level controlled Vital Signs Assessment: post-procedure vital signs reviewed and stable Respiratory status: spontaneous breathing, nonlabored ventilation, respiratory function stable and patient connected to nasal cannula oxygen Cardiovascular status: blood pressure returned to baseline and stable Postop Assessment: no apparent nausea or vomiting Anesthetic complications: no   No notable events documented.   Last Vitals:  Vitals:   07/22/20 1400 07/22/20 1402  BP: (!) 184/96 (!) 176/86  Pulse: 65 66  Resp: 14 19  Temp:    SpO2: 100% 100%    Last Pain:  Vitals:   07/23/20 0809  PainSc: 0-No pain                 Molli Barrows

## 2020-07-24 NOTE — Telephone Encounter (Signed)
Will call to discuss

## 2020-07-31 ENCOUNTER — Other Ambulatory Visit: Payer: Self-pay

## 2020-07-31 ENCOUNTER — Inpatient Hospital Stay (HOSPITAL_BASED_OUTPATIENT_CLINIC_OR_DEPARTMENT_OTHER): Payer: Medicare HMO | Admitting: Internal Medicine

## 2020-07-31 ENCOUNTER — Inpatient Hospital Stay: Payer: Medicare HMO | Attending: Internal Medicine

## 2020-07-31 VITALS — BP 141/83 | Temp 96.6°F | Resp 20 | Ht 62.0 in | Wt 240.0 lb

## 2020-07-31 DIAGNOSIS — C3411 Malignant neoplasm of upper lobe, right bronchus or lung: Secondary | ICD-10-CM

## 2020-07-31 DIAGNOSIS — Z85118 Personal history of other malignant neoplasm of bronchus and lung: Secondary | ICD-10-CM | POA: Insufficient documentation

## 2020-07-31 LAB — CBC WITH DIFFERENTIAL/PLATELET
Abs Immature Granulocytes: 0.01 10*3/uL (ref 0.00–0.07)
Basophils Absolute: 0 10*3/uL (ref 0.0–0.1)
Basophils Relative: 1 %
Eosinophils Absolute: 0.2 10*3/uL (ref 0.0–0.5)
Eosinophils Relative: 2 %
HCT: 43.8 % (ref 36.0–46.0)
Hemoglobin: 14.5 g/dL (ref 12.0–15.0)
Immature Granulocytes: 0 %
Lymphocytes Relative: 36 %
Lymphs Abs: 2.6 10*3/uL (ref 0.7–4.0)
MCH: 27 pg (ref 26.0–34.0)
MCHC: 33.1 g/dL (ref 30.0–36.0)
MCV: 81.4 fL (ref 80.0–100.0)
Monocytes Absolute: 0.7 10*3/uL (ref 0.1–1.0)
Monocytes Relative: 10 %
Neutro Abs: 3.7 10*3/uL (ref 1.7–7.7)
Neutrophils Relative %: 51 %
Platelets: 326 10*3/uL (ref 150–400)
RBC: 5.38 MIL/uL — ABNORMAL HIGH (ref 3.87–5.11)
RDW: 16.5 % — ABNORMAL HIGH (ref 11.5–15.5)
WBC: 7.2 10*3/uL (ref 4.0–10.5)
nRBC: 0 % (ref 0.0–0.2)

## 2020-07-31 LAB — COMPREHENSIVE METABOLIC PANEL
ALT: 15 U/L (ref 0–44)
AST: 25 U/L (ref 15–41)
Albumin: 3.9 g/dL (ref 3.5–5.0)
Alkaline Phosphatase: 70 U/L (ref 38–126)
Anion gap: 8 (ref 5–15)
BUN: 12 mg/dL (ref 8–23)
CO2: 26 mmol/L (ref 22–32)
Calcium: 9.2 mg/dL (ref 8.9–10.3)
Chloride: 103 mmol/L (ref 98–111)
Creatinine, Ser: 0.82 mg/dL (ref 0.44–1.00)
GFR, Estimated: >60 mL/min (ref >60)
Glucose, Bld: 87 mg/dL (ref 70–99)
Potassium: 3.8 mmol/L (ref 3.5–5.1)
Sodium: 137 mmol/L (ref 135–145)
Total Bilirubin: 0.6 mg/dL (ref 0.3–1.2)
Total Protein: 7.5 g/dL (ref 6.5–8.1)

## 2020-07-31 NOTE — Progress Notes (Signed)
[place] NOTE  Patient Care Team: Tracie Harrier, MD as PCP - General (Internal Medicine) Telford Nab, RN as Oncology Nurse Navigator [name], MD as Consulting Physician (Cardiothoracic Surgery) Cammie Sickle, MD as Consulting Physician (Internal Medicine) Nestor Lewandowsky, MD as Referring Physician (Cardiothoracic Surgery)  CHIEF COMPLAINTS/PURPOSE OF CONSULTATION: lung cancer  #  Oncology History Overview Note  # JAN 2021- ADENOCA [Dr.A; [place] 2021-]DEC 2020- 13 mm lobular somewhat branching right upper lobe lesion is not hypermetabolic.Number of Lymph Nodes Examined: 17 ; Pathologic Stage Classification (pTNM, AJCC 8th Edition): pT1c, pN0 [Dr.Henderickson]; STAGE I-adenocarcinoma; no adjuvant therapy  # Hx of barrets [[facility] -[facility]]  # 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  female with right upper lobe stage I lung cancer is here for follow-up/review results of the CT scan.  In the interim patient was diagnosed with COVID did not need hospitalization.  Patient unvaccinated to [place].   Denies any worsening shortness of breath or cough.  Appetite is good.  No weight loss no headaches  Review of Systems  Constitutional:  Positive for malaise/fatigue. Negative for chills, diaphoresis, fever and weight loss.  HENT:  Negative for nosebleeds and sore throat.   Eyes:  Negative for double vision.  Respiratory:  Negative for hemoptysis, sputum  production, shortness of breath and wheezing.   Cardiovascular:  Negative for chest pain, palpitations, orthopnea and leg swelling.  Gastrointestinal:  Negative for abdominal pain, blood in stool, constipation, diarrhea, heartburn, melena, nausea and vomiting.  Genitourinary:  Negative for dysuria, frequency and urgency.  Musculoskeletal:  Positive for back pain and joint pain.  Skin: Negative.  Negative for itching and rash.  Neurological:  Negative for tingling and focal weakness.  Endo/Heme/Allergies:  Does not bruise/bleed easily.  Psychiatric/Behavioral:  Negative for depression. The patient is not nervous/anxious and does not have insomnia.     MEDICAL HISTORY:  Past Medical History:  Diagnosis Date   Adenocarcinoma ([place])    right upper lobe   Arthritis    Chicken pox    Dyspnea    Environmental and seasonal allergies    Fibromyalgia    GERD (gastroesophageal reflux disease)    Hemorrhoid    Hypersomnia    Hypertension    Hypothyroidism    Lung cancer ([place])    Malignant neoplasm of right upper lobe of lung ([place]) 03/08/2019   Myositis ossificans    Osteoarthritis    Thyroid disease     SURGICAL HISTORY: Past Surgical History:  Procedure Laterality Date    bengin tongue lesion     Benign   ABDOMINAL HYSTERECTOMY     BACK SURGERY     bladder tack     BREAST BIOPSY Left    neg   CHOLECYSTECTOMY     COLONOSCOPY WITH PROPOFOL N/A 03/24/2017   Procedure: COLONOSCOPY WITH PROPOFOL;  Surgeon: Lollie Sails, MD;  Location: [facility] ENDOSCOPY;  Service: Endoscopy;  Laterality: N/A;   COLONOSCOPY WITH PROPOFOL N/A 07/22/2020   Procedure: COLONOSCOPY WITH PROPOFOL;  Surgeon: Toledo, Benay Pike, MD;  Location: [facility] ENDOSCOPY;  Service: Gastroenterology;  Laterality: N/A;   ESOPHAGOGASTRODUODENOSCOPY (EGD) WITH PROPOFOL N/A 03/24/2017   Procedure: ESOPHAGOGASTRODUODENOSCOPY (EGD) WITH PROPOFOL;  Surgeon: Lollie Sails, MD;  Location[facility];  Service: Endoscopy;   Laterality: N/A;   ESOPHAGOGASTRODUODENOSCOPY (EGD) WITH PROPOFOL N/A 07/22/2020   Procedure: ESOPHAGOGASTRODUODENOSCOPY (EGD) WITH PROPOFOL;  Surgeon: Toledo, Benay Pike, MD;  Location: [facility] ENDOSCOPY;  Service: Gastroenterology;  Laterality: N/A;   GASTRIC BYPASS OPEN     hemrrhoidectomy     INTERCOSTAL NERVE BLOCK Right 03/29/2019   Procedure: Intercostal Nerve Block;  Surgeon: [name], MD;  Location: [facility] OR;  Service: Thoracic;  Laterality: Right;   JOINT REPLACEMENT Bilateral    TKR   KNEE ARTHROPLASTY Right 07/06/2016   Procedure: COMPUTER ASSISTED TOTAL KNEE ARTHROPLASTY;  Surgeon: Dereck Leep, MD;  Location: [facility] ORS;  Service: Orthopedics;  Laterality: Right;   KNEE ARTHROSCOPY     LOBECTOMY Right 03/29/2019   XI ROBOTIC ASSISTED THORASCOPY- RIGHT UPPER LOBECTOMY (Right Chest)   NODE DISSECTION  03/29/2019   Procedure: Node Dissection;  Surgeon: [name], MD;  Location: [place];  Service: Thoracic;;   plantar fascia Left    SHOULDER ARTHROSCOPY WITH DEBRIDEMENT AND BICEP TENDON REPAIR Left 09/25/2018   Procedure: SHOULDER ARTHROSCOPY WITH DEBRIDEMENT DECOMPRESSION AND BICEP TENDON REPAIR - SCRUB [place];  Surgeon: Corky Mull, MD;  Location: [facility] ORS;  Service: Orthopedics;  Laterality: Left;   TONSILLECTOMY     VIDEO BRONCHOSCOPY WITH ENDOBRONCHIAL NAVIGATION N/A 03/04/2019   Procedure: VIDEO BRONCHOSCOPY WITH ENDOBRONCHIAL NAVIGATION;  Surgeon: Ottie Glazier, MD;  Location: [facility] ORS;  Service: Thoracic;  Laterality: N/A;   VIDEO BRONCHOSCOPY WITH ENDOBRONCHIAL ULTRASOUND N/A 03/04/2019   Procedure: VIDEO BRONCHOSCOPY WITH ENDOBRONCHIAL ULTRASOUND;  Surgeon: Ottie Glazier, MD;  Location: [facility] ORS;  Service: Thoracic;  Laterality: N/A;    SOCIAL HISTORY: Social History   Socioeconomic History   Marital status: Divorced    Spouse name: Not on file   Number of children: Not on file   Years of education: Not on file   Highest education level: Not on file   Occupational History   Not on file  Tobacco Use   Smoking status: Former    Packs/day: 2.00    Years: 10.00    Pack years: 20.00    Types: Cigarettes    Quit date: 07/17/1993    Years since quitting: 27.0   Smokeless tobacco: Never  Vaping Use   Vaping Use: Never used  Substance and Sexual Activity   Alcohol use: No   Drug use: No   Sexual activity: Not on file  Other Topics Concern   Not on file  Social History Narrative   1ppd- quit > 20 years ago; no alcohol; in [place]; lives self. retd- cook.    Social Determinants of Health   Financial Resource Strain: Not on file  Food Insecurity: Not on file  Transportation Needs: Not on file  Physical Activity: Not on file  Stress: Not on file  Social Connections: Not on file  Intimate Partner Violence: Not on file    FAMILY HISTORY: Family History  Problem Relation Age of Onset   Breast cancer Sister 47   Breast cancer Maternal Aunt 86   Lung cancer Maternal Aunt    Breast cancer Sister 54  Alzheimer's disease Mother    Arthritis Mother    Diabetes Mother    Heart failure Brother    Pancreatic cancer Brother 28   Lung cancer Brother    Liver cancer Father     ALLERGIES:  is allergic to flagyl [metronidazole], phentermine, and pregabalin.  MEDICATIONS:  Current Outpatient Medications  Medication Sig Dispense Refill   ascorbic acid (VITAMIN C) 1000 MG tablet Take 1,000 mg by mouth daily.     celecoxib (CELEBREX) 200 MG capsule Take 1 capsule (200 mg total) by mouth 2 (two) times daily with a meal. (Patient taking differently: Take 200-400 mg by mouth daily.) 60 capsule 1   estradiol (ESTRACE) 0.5 MG tablet Take 0.5 mg by mouth daily.     fluticasone (FLONASE) 50 MCG/ACT nasal spray Place 2 sprays into both nostrils daily as needed for allergies or rhinitis.      furosemide (LASIX) 20 MG tablet Take 20 mg by mouth daily as needed for fluid or edema.      levothyroxine (SYNTHROID) 88 MCG tablet Take 88 mcg by mouth  daily.     omeprazole (PRILOSEC) 20 MG capsule Take 20 mg by mouth 2 (two) times daily before a meal.      Semaglutide (OZEMPIC, 0.25 OR 0.5 MG/DOSE, [place]) Inject into the skin.     triamterene-hydrochlorothiazide (MAXZIDE-25) 37.5-25 MG tablet Take 1 tablet by mouth daily.     Vitamin D, Ergocalciferol, (DRISDOL) 1.25 MG (50000 UNIT) CAPS capsule Take 50,000 Units by mouth once a week.      zolpidem (AMBIEN) 5 MG tablet Take 5-7.5 mg by mouth at bedtime.     No current facility-administered medications for this visit.      Marland Kitchen  PHYSICAL EXAMINATION: ECOG PERFORMANCE STATUS: 0 - Asymptomatic  Vitals:   07/31/20 0944  BP: (!) 141/83  Resp: 20  Temp: (!) 96.6 F (35.9 C)   Filed Weights   07/31/20 0944  Weight: 240 lb (108.9 kg)    Physical Exam Constitutional:      Comments: Obese. She is walking herself.  HENT:     Head: Normocephalic and atraumatic.     Mouth/Throat:     Pharynx: No oropharyngeal exudate.  Eyes:     Pupils: Pupils are equal, round, and reactive to light.  Cardiovascular:     Rate and Rhythm: Normal rate and regular rhythm.  Pulmonary:     Effort: Pulmonary effort is normal. No respiratory distress.     Breath sounds: Normal breath sounds. No wheezing.  Abdominal:     General: Bowel sounds are normal. There is no distension.     Palpations: Abdomen is soft. There is no mass.     Tenderness: There is no abdominal tenderness. There is no guarding or rebound.  Musculoskeletal:        General: No tenderness. Normal range of motion.     Cervical back: Normal range of motion and neck supple.  Skin:    General: Skin is warm.  Neurological:     Mental Status: She is alert and oriented to person, place, and time.  Psychiatric:        Mood and Affect: Affect normal.   LABORATORY DATA:  I have reviewed the data as listed Lab Results  Component Value Date   WBC 7.2 07/31/2020   HGB 14.5 07/31/2020   HCT 43.8 07/31/2020   MCV 81.4 07/31/2020   PLT 326  07/31/2020   Recent Labs    10/30/19 0852 02/04/20  9326 03/05/20 1021 07/03/20 1056 07/31/20 0937  NA 137  --  137  --  137  K 3.8  --  3.2*  --  3.8  CL 100  --  99  --  103  CO2 26  --  29  --  26  GLUCOSE 101*  --  97  --  87  BUN 13  --  11  --  12  CREATININE 0.89   < > 0.91 0.90 0.82  CALCIUM 9.0  --  8.9  --  9.2  GFRNONAA >60  --  >60  --  >60  GFRAA >60  --   --   --   --   PROT 7.4  --  7.1  --  7.5  ALBUMIN 3.9  --  3.9  --  3.9  AST 27  --  25  --  25  ALT 14  --  15  --  15  ALKPHOS 66  --  70  --  70  BILITOT 0.6  --  0.7  --  0.6   < > = values in this interval not displayed.    RADIOGRAPHIC STUDIES: I have personally reviewed the radiological images as listed and agreed with the findings in the report. CT Chest W Contrast  Result Date: 07/05/2020 CLINICAL DATA:  Right lung cancer, status post right upper lobectomy in 2021, shortness of breath, cough EXAM: CT CHEST WITH CONTRAST TECHNIQUE: Multidetector CT imaging of the chest was performed during intravenous contrast administration. CONTRAST:  29mL OMNIPAQUE IOHEXOL 300 MG/ML  SOLN COMPARISON:  02/04/2020 FINDINGS: Cardiovascular: Heart is normal in size.  No pericardial effusion. No evidence of thoracic aortic aneurysm. Atherosclerotic calcifications of the aortic arch. Mild coronary atherosclerosis of the left circumflex. Mediastinum/Nodes: No suspicious mediastinal lymphadenopathy. Visualized thyroid is unremarkable. Lungs/Pleura: Status post right upper lobectomy. Stable 4 mm nodule in the lateral right lower lobe (series 3/image 66). Stable 3 mm subpleural nodule in the lingula (series 3/image 33). Stable 3 mm subpleural nodule in the posterior left lower lobe (series 3/image 80). These can be considered benign. No new/suspicious pulmonary nodules. No focal consolidation. No pleural effusion or pneumothorax. Upper Abdomen: Visualized upper abdomen is notable for prior cholecystectomy, postsurgical changes related  to gastric bypass, and a stable 2.5 cm exophytic cyst along the right upper kidney (series 2/image 117). Musculoskeletal: Visualized osseous structures are within normal limits. IMPRESSION: Status post right upper lobectomy. No evidence of recurrent or metastatic disease. Aortic Atherosclerosis (ICD10-I70.0). Electronically Signed   By: Julian Hy M.D.   On: 07/05/2020 13:39     ASSESSMENT & PLAN:   Malignant neoplasm of right upper lobe of lung ([place]) # RIGHT UPPER LOBE LUNG CA-stage I-status post resection clear margins;  MAY 22nd, 2022- Status post right upper lobectomy; No evidence of recurrent or metastatic disease.   #May 2022 CT scan -Bil 3-4 mm nodule-s likley benign.  Stable  # Hx of gastric Bypass-no anemia.  Check B12 at next visit.    # Barrets [EGD; Dr.Toledo- June 2022]  On PP1 EGD q 3 years. Discussed re: importance of weight loss.   #Recent COVID infection: patient is to unvaccinated to Covid-19 plan to get in 3 months.   # DISPOSITION: # follow up in 6 months; MD; labs- cbc/cmp;B12; CT chest prior- Dr.B  # I reviewed the blood work- with the patient in detail; also reviewed the imaging independently [as summarized above]; and with the patient in detail.  All questions were answered. The patient knows to call the clinic with any problems, questions or concerns.    Cammie Sickle, MD 07/31/2020 8:50 PM

## 2020-07-31 NOTE — Assessment & Plan Note (Addendum)
#   RIGHT UPPER LOBE LUNG CA-stage I-status post resection clear margins;  MAY 22nd, 2022- Status post right upper lobectomy; No evidence of recurrent or metastatic disease.  #May 2022 CT scan -Bil 3-4 mm nodule-s likley benign.  Stable  # Hx of gastric Bypass-no anemia.  Check B12 at next visit.  # Barrets [EGD; Dr.Toledo- June 2022]  On PP1 EGD q 3 years. Discussed re: importance of weight loss.   #Recent COVID infection: patient is to unvaccinated to Covid-19 plan to get in 3 months.   # DISPOSITION: # follow up in 6 months; MD; labs- cbc/cmp;B12; CT chest prior- Dr.B  # I reviewed the blood work- with the patient in detail; also reviewed the imaging independently [as summarized above]; and with the patient in detail.

## 2021-01-14 ENCOUNTER — Other Ambulatory Visit: Payer: Self-pay | Admitting: Internal Medicine

## 2021-01-14 DIAGNOSIS — Z1231 Encounter for screening mammogram for malignant neoplasm of breast: Secondary | ICD-10-CM

## 2021-01-21 ENCOUNTER — Telehealth: Payer: Self-pay | Admitting: *Deleted

## 2021-01-21 ENCOUNTER — Ambulatory Visit: Admission: RE | Admit: 2021-01-21 | Payer: Medicare HMO | Source: Ambulatory Visit

## 2021-01-21 NOTE — Telephone Encounter (Signed)
Everything changed pt aware

## 2021-01-21 NOTE — Telephone Encounter (Signed)
Pt also sent a schedule message requesting to reschedule.

## 2021-01-21 NOTE — Telephone Encounter (Signed)
CT called reporting that patient did not show up for her CT appointment today

## 2021-01-26 ENCOUNTER — Inpatient Hospital Stay: Payer: Medicare HMO

## 2021-01-26 ENCOUNTER — Inpatient Hospital Stay: Payer: Medicare HMO | Admitting: Nurse Practitioner

## 2021-01-28 ENCOUNTER — Other Ambulatory Visit: Payer: Self-pay

## 2021-01-28 ENCOUNTER — Ambulatory Visit
Admission: RE | Admit: 2021-01-28 | Discharge: 2021-01-28 | Disposition: A | Discharge status: Home / Self Care | Payer: Medicare HMO | Source: Ambulatory Visit | Attending: Internal Medicine | Admitting: Internal Medicine

## 2021-01-28 DIAGNOSIS — C3411 Malignant neoplasm of upper lobe, right bronchus or lung: Secondary | ICD-10-CM | POA: Diagnosis not present

## 2021-02-01 ENCOUNTER — Inpatient Hospital Stay (HOSPITAL_BASED_OUTPATIENT_CLINIC_OR_DEPARTMENT_OTHER): Payer: Medicare HMO | Admitting: Nurse Practitioner

## 2021-02-01 ENCOUNTER — Encounter: Payer: Self-pay | Admitting: Nurse Practitioner

## 2021-02-01 ENCOUNTER — Inpatient Hospital Stay: Payer: Medicare HMO | Attending: Nurse Practitioner

## 2021-02-01 ENCOUNTER — Other Ambulatory Visit: Payer: Self-pay

## 2021-02-01 VITALS — BP 161/80 | HR 65 | Temp 98.0°F | Resp 16 | Ht 62.0 in | Wt 240.0 lb

## 2021-02-01 DIAGNOSIS — Z8616 Personal history of COVID-19: Secondary | ICD-10-CM | POA: Diagnosis not present

## 2021-02-01 DIAGNOSIS — Z87891 Personal history of nicotine dependence: Secondary | ICD-10-CM | POA: Insufficient documentation

## 2021-02-01 DIAGNOSIS — M549 Dorsalgia, unspecified: Secondary | ICD-10-CM | POA: Insufficient documentation

## 2021-02-01 DIAGNOSIS — K2270 Barrett's esophagus without dysplasia: Secondary | ICD-10-CM | POA: Insufficient documentation

## 2021-02-01 DIAGNOSIS — Z28310 Unvaccinated for covid-19: Secondary | ICD-10-CM | POA: Insufficient documentation

## 2021-02-01 DIAGNOSIS — R5383 Other fatigue: Secondary | ICD-10-CM | POA: Insufficient documentation

## 2021-02-01 DIAGNOSIS — Z85118 Personal history of other malignant neoplasm of bronchus and lung: Secondary | ICD-10-CM

## 2021-02-01 DIAGNOSIS — Z9884 Bariatric surgery status: Secondary | ICD-10-CM | POA: Diagnosis not present

## 2021-02-01 DIAGNOSIS — R5381 Other malaise: Secondary | ICD-10-CM | POA: Diagnosis not present

## 2021-02-01 DIAGNOSIS — Z08 Encounter for follow-up examination after completed treatment for malignant neoplasm: Secondary | ICD-10-CM | POA: Diagnosis not present

## 2021-02-01 DIAGNOSIS — C3411 Malignant neoplasm of upper lobe, right bronchus or lung: Secondary | ICD-10-CM

## 2021-02-01 DIAGNOSIS — M2550 Pain in unspecified joint: Secondary | ICD-10-CM | POA: Diagnosis not present

## 2021-02-01 LAB — CBC WITH DIFFERENTIAL/PLATELET
Abs Immature Granulocytes: 0.03 10*3/uL (ref 0.00–0.07)
Basophils Absolute: 0.1 10*3/uL (ref 0.0–0.1)
Basophils Relative: 1 %
Eosinophils Absolute: 0.3 10*3/uL (ref 0.0–0.5)
Eosinophils Relative: 3 %
HCT: 45.3 % (ref 36.0–46.0)
Hemoglobin: 14.2 g/dL (ref 12.0–15.0)
Immature Granulocytes: 0 %
Lymphocytes Relative: 35 %
Lymphs Abs: 3.2 10*3/uL (ref 0.7–4.0)
MCH: 25.9 pg — ABNORMAL LOW (ref 26.0–34.0)
MCHC: 31.3 g/dL (ref 30.0–36.0)
MCV: 82.7 fL (ref 80.0–100.0)
Monocytes Absolute: 0.7 10*3/uL (ref 0.1–1.0)
Monocytes Relative: 7 %
Neutro Abs: 5 10*3/uL (ref 1.7–7.7)
Neutrophils Relative %: 54 %
Platelets: 319 10*3/uL (ref 150–400)
RBC: 5.48 MIL/uL — ABNORMAL HIGH (ref 3.87–5.11)
RDW: 16.2 % — ABNORMAL HIGH (ref 11.5–15.5)
WBC: 9.2 10*3/uL (ref 4.0–10.5)
nRBC: 0 % (ref 0.0–0.2)

## 2021-02-01 LAB — COMPREHENSIVE METABOLIC PANEL
ALT: 13 U/L (ref 0–44)
AST: 24 U/L (ref 15–41)
Albumin: 3.9 g/dL (ref 3.5–5.0)
Alkaline Phosphatase: 73 U/L (ref 38–126)
Anion gap: 9 (ref 5–15)
BUN: 17 mg/dL (ref 8–23)
CO2: 28 mmol/L (ref 22–32)
Calcium: 8.9 mg/dL (ref 8.9–10.3)
Chloride: 99 mmol/L (ref 98–111)
Creatinine, Ser: 0.85 mg/dL (ref 0.44–1.00)
GFR, Estimated: >60 mL/min (ref >60)
Glucose, Bld: 110 mg/dL — ABNORMAL HIGH (ref 70–99)
Potassium: 3.3 mmol/L — ABNORMAL LOW (ref 3.5–5.1)
Sodium: 136 mmol/L (ref 135–145)
Total Bilirubin: 0.5 mg/dL (ref 0.3–1.2)
Total Protein: 7.3 g/dL (ref 6.5–8.1)

## 2021-02-01 LAB — VITAMIN B12: Vitamin B-12: 673 pg/mL (ref 180–914)

## 2021-02-01 NOTE — Progress Notes (Signed)
[place] NOTE  Patient Care Team: Tracie Harrier, MD as PCP - General (Internal Medicine) Telford Nab, RN as Oncology Nurse Navigator [name], MD as Consulting Physician (Cardiothoracic Surgery) Cammie Sickle, MD as Consulting Physician (Internal Medicine) Nestor Lewandowsky, MD (Inactive) as Referring Physician (Cardiothoracic Surgery)  CHIEF COMPLAINTS/PURPOSE OF CONSULTATION: lung cancer  #  Oncology History Overview Note  # JAN 2021- ADENOCA [Dr.A; [place] 2021-]DEC 2020- 13 mm lobular somewhat branching right upper lobe lesion is not hypermetabolic.Number of Lymph Nodes Examined: 17 ; Pathologic Stage Classification (pTNM, AJCC 8th Edition): pT1c, pN0 [Dr.Henderickson]; STAGE I-adenocarcinoma; no adjuvant therapy  # Hx of barrets [[facility] -[facility]]  # Hx of garstic bypass [1970s]  # SURVIVORSHIP: P  # GENETICS: NA  DIAGNOSIS: lung cancer  STAGE:  I       ;  GOALS: cure  CURRENT/MOST RECENT THERAPY : Surveillance    Malignant neoplasm of right upper lobe of lung ([place])  03/08/2019 Initial Diagnosis   Malignant neoplasm of right upper lobe of lung ([place])   04/03/2019 Cancer Staging   Staging form: Lung, AJCC 8th Edition - Clinical stage from 04/03/2019: ycT1, cN0, cM0 - Signed by [name], MD on 04/03/2019       HISTORY OF PRESENTING ILLNESS:  Glenda Gomez 70 y.o.  female with right upper lobe stage I lung cancer is here for follow up and continued surveillance of lung cancer.     Review of Systems  Constitutional:  Positive for malaise/fatigue. Negative for chills, diaphoresis, fever and weight loss.  HENT:  Negative for nosebleeds and sore throat.   Eyes:  Negative for double vision.  Respiratory:  Negative for hemoptysis, sputum production, shortness of breath and wheezing.   Cardiovascular:  Negative for chest pain, palpitations, orthopnea and leg swelling.  Gastrointestinal:  Negative for abdominal pain, blood  in stool, constipation, diarrhea, heartburn, melena, nausea and vomiting.  Genitourinary:  Negative for dysuria, frequency and urgency.  Musculoskeletal:  Positive for back pain and joint pain.  Skin: Negative.  Negative for itching and rash.  Neurological:  Negative for tingling and focal weakness.  Endo/Heme/Allergies:  Does not bruise/bleed easily.  Psychiatric/Behavioral:  Negative for depression. The patient is not nervous/anxious and does not have insomnia.     MEDICAL HISTORY:  Past Medical History:  Diagnosis Date   Adenocarcinoma ([place])    right upper lobe   Arthritis    Chicken pox    Dyspnea    Environmental and seasonal allergies    Fibromyalgia    GERD (gastroesophageal reflux disease)    Hemorrhoid    Hypersomnia    Hypertension    Hypothyroidism    Lung cancer ([place])    Malignant neoplasm of right upper lobe of lung ([place]) 03/08/2019   Myositis ossificans    Osteoarthritis    Thyroid disease     SURGICAL HISTORY: Past Surgical History:  Procedure Laterality Date    bengin tongue lesion     Benign   ABDOMINAL HYSTERECTOMY     BACK SURGERY     bladder tack     BREAST BIOPSY Left    neg   CHOLECYSTECTOMY     COLONOSCOPY WITH PROPOFOL N/A 03/24/2017   Procedure: COLONOSCOPY WITH PROPOFOL;  Surgeon: Lollie Sails, MD;  [facility]COPY;  Service: Endoscopy;  Laterality: N/A;   COLONOSCOPY WITH PROPOFOL N/A 07/22/2020   Procedure: COLONOSCOPY WITH PROPOFOL;  Surgeon: Alice Reichert, Benay Pike, MD;  Location: [facility]  ENDOSCOPY;  Service: Gastroenterology;  Laterality: N/A;   ESOPHAGOGASTRODUODENOSCOPY (EGD) WITH PROPOFOL N/A 03/24/2017   Procedure: ESOPHAGOGASTRODUODENOSCOPY (EGD) WITH PROPOFOL;  Surgeon: Lollie Sails, MD;  Location: [facility] ENDOSCOPY;  Service: Endoscopy;  Laterality: N/A;   ESOPHAGOGASTRODUODENOSCOPY (EGD) WITH PROPOFOL N/A 07/22/2020   Procedure: ESOPHAGOGASTRODUODENOSCOPY (EGD) WITH PROPOFOL;  Surgeon: [place], Benay Pike, MD;  Location: [facility]  ENDOSCOPY;  Service: Gastroenterology;  Laterality: N/A;   GASTRIC BYPASS OPEN     hemrrhoidectomy     INTERCOSTAL NERVE BLOCK Right 03/29/2019   Procedure: Intercostal Nerve Block;  Surgeon: [name], MD;  Location: [facility] OR;  Service: Thoracic;  Laterality: Right;   JOINT REPLACEMENT Bilateral    TKR   KNEE ARTHROPLASTY Right 07/06/2016   Procedure: COMPUTER ASSISTED TOTAL KNEE ARTHROPLASTY;  Surgeon: Dereck Leep, MD;  Location: [facility] ORS;  Service: Orthopedics;  Laterality: Right;   KNEE ARTHROSCOPY     LOBECTOMY Right 03/29/2019   XI ROBOTIC ASSISTED THORASCOPY- RIGHT UPPER LOBECTOMY (Right Chest)   NODE DISSECTION  03/29/2019   Procedure: Node Dissection;  Surgeon: [name], MD;  Location: [facility];  Service: Thoracic;;   plantar fascia Left    SHOULDER ARTHROSCOPY WITH DEBRIDEMENT AND BICEP TENDON REPAIR Left 09/25/2018   Procedure: SHOULDER ARTHROSCOPY WITH DEBRIDEMENT DECOMPRESSION AND BICEP TENDON REPAIR - SCRUB [place];  Surgeon: Corky Mull, MD;  Location: [facility] ORS;  Service: Orthopedics;  Laterality: Left;   TONSILLECTOMY     VIDEO BRONCHOSCOPY WITH ENDOBRONCHIAL NAVIGATION N/A 03/04/2019   Procedure: VIDEO BRONCHOSCOPY WITH ENDOBRONCHIAL NAVIGATION;  Surgeon: Ottie Glazier, MD;  Location: [facility] ORS;  Service: Thoracic;  Laterality: N/A;   VIDEO BRONCHOSCOPY WITH ENDOBRONCHIAL ULTRASOUND N/A 03/04/2019   Procedure: VIDEO BRONCHOSCOPY WITH ENDOBRONCHIAL ULTRASOUND;  Surgeon: Ottie Glazier, MD;  Location: [facility] ORS;  Service: Thoracic;  Laterality: N/A;   SOCIAL HISTORY: Social History   Socioeconomic History   Marital status: Divorced    Spouse name: Not on file   Number of children: Not on file   Years of education: Not on file   Highest education level: Not on file  Occupational History   Not on file  Tobacco Use   Smoking status: Former    Packs/day: 2.00    Years: 10.00    Pack years: 20.00    Types: Cigarettes    Quit date: 07/17/1993    Years  since quitting: 27.5   Smokeless tobacco: Never  Vaping Use   Vaping Use: Never used  Substance and Sexual Activity   Alcohol use: No   Drug use: No   Sexual activity: Not on file  Other Topics Concern   Not on file  Social History Narrative   1ppd- quit > 20 years ago; no alcohol; in [place]; lives self. retd- cook.    Social Determinants of Health   Financial Resource Strain: Not on file  Food Insecurity: Not on file  Transportation Needs: Not on file  Physical Activity: Not on file  Stress: Not on file  Social Connections: Not on file  Intimate Partner Violence: Not on file   FAMILY HISTORY: Family History  Problem Relation Age of Onset   Breast cancer Sister 22   Breast cancer Maternal Aunt 47   Lung cancer Maternal Aunt    Breast cancer Sister 68   Alzheimer's disease Mother    Arthritis Mother    Diabetes Mother    Heart failure Brother    Pancreatic cancer Brother 19   Lung cancer Brother  Liver cancer Father    ALLERGIES:  is allergic to flagyl [metronidazole], phentermine, and pregabalin.  MEDICATIONS:  Current Outpatient Medications  Medication Sig Dispense Refill   ascorbic acid (VITAMIN C) 1000 MG tablet Take 1,000 mg by mouth daily.     celecoxib (CELEBREX) 200 MG capsule Take 1 capsule (200 mg total) by mouth 2 (two) times daily with a meal. (Patient taking differently: Take 200-400 mg by mouth daily.) 60 capsule 1   estradiol (ESTRACE) 0.5 MG tablet Take 0.5 mg by mouth daily.     fluticasone (FLONASE) 50 MCG/ACT nasal spray Place 2 sprays into both nostrils daily as needed for allergies or rhinitis.      furosemide (LASIX) 20 MG tablet Take 20 mg by mouth daily as needed for fluid or edema.      levothyroxine (SYNTHROID) 88 MCG tablet Take 88 mcg by mouth daily.     omeprazole (PRILOSEC) 20 MG capsule Take 20 mg by mouth 2 (two) times daily before a meal.      Semaglutide (OZEMPIC, 0.25 OR 0.5 MG/DOSE, [place]) Inject into the skin.      triamterene-hydrochlorothiazide (MAXZIDE-25) 37.5-25 MG tablet Take 1 tablet by mouth daily.     Vitamin D, Ergocalciferol, (DRISDOL) 1.25 MG (50000 UNIT) CAPS capsule Take 50,000 Units by mouth once a week.      zolpidem (AMBIEN) 5 MG tablet Take 5-7.5 mg by mouth at bedtime.     No current facility-administered medications for this visit.  Marland Kitchen  PHYSICAL EXAMINATION: ECOG PERFORMANCE STATUS: 0 - Asymptomatic  Vitals:   02/01/21 1436  BP: (!) 161/80  Pulse: 65  Resp: 16  Temp: 98 F (36.7 C)  SpO2: 99%   Filed Weights   02/01/21 1436  Weight: 240 lb (108.9 kg)    Physical Exam Constitutional:      Comments: Obese. She is walking herself.  HENT:     Head: Normocephalic and atraumatic.     Mouth/Throat:     Pharynx: No oropharyngeal exudate.  Eyes:     Pupils: Pupils are equal, round, and reactive to light.  Cardiovascular:     Rate and Rhythm: Normal rate and regular rhythm.  Pulmonary:     Effort: Pulmonary effort is normal. No respiratory distress.     Breath sounds: Normal breath sounds. No wheezing.  Abdominal:     General: Bowel sounds are normal. There is no distension.     Palpations: Abdomen is soft. There is no mass.     Tenderness: There is no abdominal tenderness. There is no guarding or rebound.  Musculoskeletal:        General: No tenderness. Normal range of motion.     Cervical back: Normal range of motion and neck supple.  Skin:    General: Skin is warm.  Neurological:     Mental Status: She is alert and oriented to person, place, and time.  Psychiatric:        Mood and Affect: Affect normal.   LABORATORY DATA:  I have reviewed the data as listed Lab Results  Component Value Date   WBC 9.2 02/01/2021   HGB 14.2 02/01/2021   HCT 45.3 02/01/2021   MCV 82.7 02/01/2021   PLT 319 02/01/2021   Recent Labs    03/05/20 1021 07/03/20 1056 07/31/20 0937  NA 137  --  137  K 3.2*  --  3.8  CL 99  --  103  CO2 29  --  26  GLUCOSE 97  --  87  BUN  11  --  12  CREATININE 0.91 0.90 0.82  CALCIUM 8.9  --  9.2  GFRNONAA >60  --  >60  PROT 7.1  --  7.5  ALBUMIN 3.9  --  3.9  AST 25  --  25  ALT 15  --  15  ALKPHOS 70  --  70  BILITOT 0.7  --  0.6    RADIOGRAPHIC STUDIES: I have personally reviewed the radiological images as listed and agreed with the findings in the report. CT Chest Wo Contrast  Result Date: 01/29/2021 CLINICAL DATA:  Non-small-cell lung cancer.  Restaging. EXAM: CT CHEST WITHOUT CONTRAST TECHNIQUE: Multidetector CT imaging of the chest was performed following the standard protocol without IV contrast. COMPARISON:  07/03/2020 FINDINGS: Cardiovascular: The heart size is normal. No substantial pericardial effusion. Mild atherosclerotic calcification is noted in the wall of the thoracic aorta. Mediastinum/Nodes: No mediastinal lymphadenopathy. No evidence for gross hilar lymphadenopathy although assessment is limited by the lack of intravenous contrast on the current study. The esophagus has normal imaging features. There is no axillary lymphadenopathy. Lungs/Pleura: Stable volume loss and postsurgical change right hemithorax 3 mm right lower lobe nodule on 65/3 is stable. 3 mm subpleural nodule in the lingula (82/3 is unchanged. Additional tiny subpleural nodules noted left hemithorax, no new suspicious pulmonary nodule or mass. No focal airspace consolidation. No pleural effusion. Upper Abdomen: 2.5 cm exophytic cyst upper pole right kidney is stable patient is status post gastric bypass. Musculoskeletal: No worrisome lytic or sclerotic osseous abnormality. IMPRESSION: 1. Stable exam. No new or progressive findings to suggest recurrent or metastatic disease. 2. Stable volume loss and postsurgical change right hemithorax. 3. Tiny bilateral pulmonary nodules, unchanged in the interval. 4. Aortic Atherosclerosis (ICD10-I70.0). Electronically Signed   By: Misty Stanley M.D.   On: 01/29/2021 10:45     ASSESSMENT & PLAN:   Right  upper lobe lung cancer- stage I. S/p resection with clear margins May 22nd 2022 s/p right upper lobectomy. I independently reviewed ct chest from 01/28/21 and agree with findings of no lymphadenopathy or evidence of malignancy. 68mm right lower lobe nodule is unchanged. Continue surveillance with CT scan prior in 6 months.  Hx of gastric bypass; no anemia. B12 pending today Barrets- EGD with Dr. Alice Reichert June 2022. On PPI. EGD every 3 years.  Hx of covid- unvaccinated.   DISPOSITION: 6 mo- ct chest wo contrast then day to week later, labs (cbc, cmp) & Dr. Jacinto Reap- la  No problem-specific Assessment & Plan notes found for this encounter.  All questions were answered. The patient knows to call the clinic with any problems, questions or concerns.    Verlon Au, NP 02/01/2021

## 2021-02-02 ENCOUNTER — Encounter: Payer: Self-pay | Admitting: Internal Medicine

## 2021-02-02 ENCOUNTER — Ambulatory Visit
Admission: RE | Admit: 2021-02-02 | Discharge: 2021-02-02 | Disposition: A | Discharge status: Home / Self Care | Payer: Medicare HMO | Source: Ambulatory Visit | Attending: Internal Medicine | Admitting: Internal Medicine

## 2021-02-02 ENCOUNTER — Other Ambulatory Visit: Payer: Self-pay

## 2021-02-02 DIAGNOSIS — Z1231 Encounter for screening mammogram for malignant neoplasm of breast: Secondary | ICD-10-CM | POA: Diagnosis present

## 2021-02-09 ENCOUNTER — Other Ambulatory Visit: Payer: Self-pay | Admitting: Internal Medicine

## 2021-02-09 DIAGNOSIS — N6310 Unspecified lump in the right breast, unspecified quadrant: Secondary | ICD-10-CM

## 2021-02-09 DIAGNOSIS — R928 Other abnormal and inconclusive findings on diagnostic imaging of breast: Secondary | ICD-10-CM

## 2021-02-11 ENCOUNTER — Ambulatory Visit
Admission: RE | Admit: 2021-02-11 | Discharge: 2021-02-11 | Disposition: A | Discharge status: Home / Self Care | Payer: Medicare HMO | Source: Ambulatory Visit | Attending: Internal Medicine | Admitting: Internal Medicine

## 2021-02-11 ENCOUNTER — Other Ambulatory Visit: Payer: Self-pay

## 2021-02-11 DIAGNOSIS — R928 Other abnormal and inconclusive findings on diagnostic imaging of breast: Secondary | ICD-10-CM | POA: Insufficient documentation

## 2021-02-11 DIAGNOSIS — N6310 Unspecified lump in the right breast, unspecified quadrant: Secondary | ICD-10-CM

## 2021-02-16 ENCOUNTER — Other Ambulatory Visit: Payer: Self-pay | Admitting: Internal Medicine

## 2021-02-16 DIAGNOSIS — N6310 Unspecified lump in the right breast, unspecified quadrant: Secondary | ICD-10-CM

## 2021-02-16 DIAGNOSIS — R928 Other abnormal and inconclusive findings on diagnostic imaging of breast: Secondary | ICD-10-CM

## 2021-02-22 ENCOUNTER — Other Ambulatory Visit: Payer: Self-pay

## 2021-02-22 ENCOUNTER — Ambulatory Visit
Admission: RE | Admit: 2021-02-22 | Discharge: 2021-02-22 | Disposition: A | Discharge status: Home / Self Care | Payer: Medicare HMO | Source: Ambulatory Visit | Attending: Internal Medicine | Admitting: Internal Medicine

## 2021-02-22 DIAGNOSIS — R928 Other abnormal and inconclusive findings on diagnostic imaging of breast: Secondary | ICD-10-CM

## 2021-02-22 DIAGNOSIS — N6310 Unspecified lump in the right breast, unspecified quadrant: Secondary | ICD-10-CM

## 2021-02-22 HISTORY — PX: BREAST BIOPSY: SHX20

## 2021-02-24 NOTE — Progress Notes (Signed)
Navigation initiated for newly diagnosed breast cancer.  Patient already established with Dr. Rogue Bussing.  Consult scheduled with Dr. Bary Castilla on 02/25/21.

## 2021-02-25 ENCOUNTER — Other Ambulatory Visit: Payer: Self-pay | Admitting: General Surgery

## 2021-02-25 DIAGNOSIS — C50511 Malignant neoplasm of lower-outer quadrant of right female breast: Secondary | ICD-10-CM

## 2021-02-25 NOTE — Progress Notes (Signed)
Subjective:     Patient ID: Glenda Gomez is a 71 y.o. female.   HPI   The following portions of the patient's history were reviewed and updated as appropriate.   This an established patient is here today for: office visit. The patient has been referred by Glenda Gomez for evaluation of a newly diagnosed right breast cancer. Patient reports that 8 months ago she noticed her right breast had increased in size.  This was not associated with any pain or discomfort.   The patient reports it took about 6-9 months for the postthoracotomy pain to resolve.     Patient reports her sister, Glenda Gomez, is a patient of ours who was treated for breast cancer.    The patient reports her bra size is a 42 DD.    The patient reports that both of her sisters had breast cancer, her older sister dying from same.  She had 4 brothers, 2 of whom have had lung cancer.        Chief Complaint  Patient presents with   Treatment Plan Discussion      BP (!) 142/80    Pulse 70    Temp 36.7 C (98 F)    Ht 154.9 cm (5\' 1" )    Wt (!) 106.6 kg (235 lb)    LMP  (LMP Unknown)    SpO2 96%    BMI 44.40 kg/m        Past Medical History:  Diagnosis Date   Arthritis     Calculus of gallbladder with other cholecystitis, without mention of obstruction     Chicken pox     Colon polyp     Environmental allergies     Essential hypertension, benign     Fibromyalgia     GERD (gastroesophageal reflux disease)     Glaucoma (increased eye pressure)     Hemorrhoid     Lung cancer (CMS-HCC) 03/08/2019   Myositis ossificans      chronic pain, both feet   Osteoarthritis     Other and unspecified angina pectoris (CMS-HCC)     Thyroid disease     Unspecified hypothyroidism             Past Surgical History:  Procedure Laterality Date   ABDOMINAL HYSTERECTOMY   1979   Left total knee arthroplasty   11/17/2004    [facility]   Left knee arthrotomy, extensive synovectomy, and polyethylene exchange   10/19/2006    Dr 12/19/2006    CHOLECYSTECTOMY   2010   BLADDER TAC   2011   COLONOSCOPY   12/22/2011   LAMINECTOMY POSTERIOR LUMBAR FACETECTOMY & FORAMINOTOMY W/DECOMP Bilateral 06/24/2015    Procedure: Lumbar two-three laminectomy;  Surgeon: 08/24/2015, MD;  Location: [facility];  Service: Neurosurgery;  Laterality: Bilateral;   back surgery   07/17/2015   Right total knee arthroplasty using computer-assisted navigation   07/06/2016    Dr 07/08/2016   COLONOSCOPY   03/24/2017    Tubular adenoma of the colon/Hyperplastic colon polyp/Repeat 39yrs/MUS   EGD   03/24/2017    GERD/Gastritis/PHx BE/Repeat 72yrs/MUS   Extensive arthroscopic debridement with biceps tenolysis and arthroscopic subacromial decompression, left shoulder. Left 09/25/2018    Dr. 11/25/2018   video bronchoscopy with endobronchial navigation   03/04/2019   thorascopy and lobectomy Right 03/29/2019   COLONOSCOPY   07/22/2020    Tubular adenomas/PHx CP/Repeat 60yrs/TKT   EGD   07/22/2020    Barrett's Esophagus/Gastritis/Repeat 57yrs/TKT  ultrasound guided breast biopsy Right 02/22/2021   BREAST EXCISIONAL BIOPSY Left      negative   COLONOSCOPY       EXCISION OF BENIGN TONGUE LESION    1980s   GASTRIC BYPASS OPEN   0865H    COMPLICATED BY HERNIATED STOMACH REQUIRING REVERSAL WITH MESH   HEMRRHOIDECTOMY       JOINT REPLACEMENT       Left knee arthroscopy        x 3   LEFT PLANTAR FASCIA RELEASE Left     Right knee arthroscopy         x 2   Right plantar fascia release         x 3   SPINE SURGERY       TONSILLECTOMY        CHILDHOOD   UPPER GASTROINTESTINAL ENDOSCOPY                    OB History     Gravida  2   Para  2   Term      Preterm      AB      Living         SAB      IAB      Ectopic      Molar      Multiple      Live Births           Obstetric Comments  Age at first period 31 Age of first pregnancy 23             Social History           Socioeconomic History   Marital status:  Divorced   Number of children: 2   Years of education: 10  Occupational History   Occupation: Disabled  Tobacco Use   Smoking status: Former      Packs/day: 2.50      Years: 8.00      Pack years: 20.00      Types: Cigarettes      Quit date: 07/17/1993      Years since quitting: 27.6   Smokeless tobacco: Never  Vaping Use   Vaping Use: Never used  Substance and Sexual Activity   Alcohol use: Yes      Comment: occassionally   Drug use: No   Sexual activity: Not Currently      Partners: Male             Allergies  Allergen Reactions   Phentermine Abdominal Pain      Constipation, Bloating   Flagyl [Metronidazole Hcl] Diarrhea, Nausea and Vomiting   Lyrica [Pregabalin] Dizziness      Current Medications        Current Outpatient Medications  Medication Sig Dispense Refill   albuterol 90 mcg/actuation inhaler INHALE 2 PUFFS BY MOUTH EVERY 6 HOURS AS NEEDED FOR WHEEZING 54 g 1   ALPRAZolam (XANAX) 0.25 MG tablet Take 1 tablet (0.25 mg total) by mouth once daily as needed for Sleep for up to 30 days 30 tablet 1   ascorbic acid, vitamin C, (VITAMIN C) 1000 MG tablet Take 1,000 mg by mouth once daily       aspirin 81 MG EC tablet Take 81 mg by mouth once daily       busPIRone (BUSPAR) 5 MG tablet Take 1 tablet (5 mg total) by mouth 2 (two) times daily 60 tablet 4   celecoxib (CELEBREX) 200  MG capsule TAKE 1 CAPSULE(200 MG) BY MOUTH TWICE DAILY 180 capsule 1   cyanocobalamin (VITAMIN B12) 1000 MCG tablet Take 1 tablet (1,000 mcg total) by mouth once daily 30 tablet 5   ergocalciferol, vitamin D2, 1,250 mcg (50,000 unit) capsule Take 1 capsule (50,000 Units total) by mouth every 7 (seven) days 4 capsule 5   estradioL (ESTRACE) 0.5 MG tablet Take 1 tablet (0.5 mg total) by mouth once daily 90 tablet 1   FUROsemide (LASIX) 20 MG tablet Take 1 tablet (20 mg total) by mouth once daily 30 tablet 3   levothyroxine (SYNTHROID) 100 MCG tablet Take 1 tablet (100 mcg total) by mouth once  daily Take on an empty stomach with a glass of water at least 30-60 minutes before breakfast. (Patient taking differently: Take 88 mcg by mouth once daily Take on an empty stomach with a glass of water at least 30-60 minutes before breakfast.) 90 tablet 3   omeprazole (PRILOSEC) 20 MG DR capsule Take 2 capsules (40 mg total) by mouth once daily 180 capsule 1   predniSONE (DELTASONE) 10 MG tablet Take 1 tablet (10 mg total) by mouth once daily take 3 tablets 3 days, 2 tablets for 3 days and 1 tablet for 3 days then stop 18 tablet 0   semaglutide (OZEMPIC) 0.25 mg or 0.5 mg(2 mg/1.5 mL) pen injector Inject 0.1875 mLs (0.25 mg total) subcutaneously once a week for 30 days 0.75 mL 4   triamterene-hydrochlorothiazide (MAXZIDE-25) 37.5-25 mg tablet Take 1 tablet by mouth once daily 90 tablet 1   zolpidem (AMBIEN) 5 MG tablet TAKE 1 AND 1/2 TABLETS(7.5 MG) BY MOUTH EVERY NIGHT AS NEEDED FOR SLEEP 45 tablet 1    No current facility-administered medications for this visit.             Family History  Problem Relation Age of Onset   Alzheimer's disease Mother     Arthritis Mother     Diabetes Mother     Dementia Mother     Cancer Father     Liver cancer Father     Breast cancer Sister 4   Cancer Sister     Breast cancer Sister 62   Myocardial Infarction (Heart attack) Brother     Cancer Brother     Pancreatic cancer Brother     Lung cancer Brother     Breast cancer Maternal Aunt 80   Lung cancer Maternal Aunt     Anesthesia problems Neg Hx          Labs and Radiology:      February 22, 2021 pathology:   . RIGHT BREAST, 8:00 8CMFN; ULTRASOUND-GUIDED BIOPSY:  - INVASIVE MAMMARY CARCINOMA, NO SPECIAL TYPE.   Size of invasive carcinoma: 7 mm in this sample  Histologic grade of invasive carcinoma: Grade 1                       Glandular/tubular differentiation score: 1                       Nuclear pleomorphism score: 2                       Mitotic rate score: 1                        Total score: 4  Ductal carcinoma in situ: Present, intermediate grade  Lymphovascular invasion: Not  identified    The definitive grade will be assigned on the excisional specimen.  ER/PR/HER2 immunohistochemistry will be performed on block A1, with  reflex to [place] for HER2 2+. The results will be reported in an addendum.    Mammogram/ultrasound review:   Imaging studies January 01, 2018 through February 19, 2020 were independently reviewed.  New lesion developing in the 8 o'clock position of the right breast.   The patient did not have mammograms in 2020/2021 as she was dealing with lung cancer.   Ultrasound exam February 25, 2021:   Imaging of the right breast was completed to determine if preoperative wire localization would be required.  The lesion is evident in the 730 o'clock position of the right breast at a depth of about 1.5 cm.  The biopsy clip is evident.   Ultrasound of a focal thickening in the upper outer quadrant, 10:00, 14 cm from the nipple showed a subcutaneous mass measuring up to 2 cm in diameter consistent with a lipoma.   Laboratory reviewed February 01, 2021:   WBC 4.0 - 10.5 K/uL 9.2   RBC 3.87 - 5.11 MIL/uL 5.48 High    Hemoglobin 12.0 - 15.0 g/dL 14.2   HCT 36.0 - 46.0 % 45.3   MCV 80.0 - 100.0 fL 82.7   MCH 26.0 - 34.0 pg 25.9 Low    MCHC 30.0 - 36.0 g/dL 31.3   RDW 11.5 - 15.5 % 16.2 High    Platelets 150 - 400 K/uL 319   nRBC 0.0 - 0.2 % 0.0   Neutrophils Relative % % 54   Neutro Abs 1.7 - 7.7 K/uL 5.0   Lymphocytes Relative % 35   Lymphs Abs 0.7 - 4.0 K/uL 3.2   Monocytes Relative % 7   Monocytes Absolute 0.1 - 1.0 K/uL 0.7   Eosinophils Relative % 3   Eosinophils Absolute 0.0 - 0.5 K/uL 0.3   Basophils Relative % 1   Basophils Absolute 0.0 - 0.1 K/uL 0.1   Immature Granulocytes % 0   Abs Immature Granulocytes 0.00 - 0.07 K/uL 0.03     Sodium 135 - 145 mmol/L 136   Potassium 3.5 - 5.1 mmol/L 3.3 Low    Chloride 98 - 111 mmol/L 99   CO2 22 -  32 mmol/L 28   Glucose, Bld 70 - 99 mg/dL 110 High    Comment: Glucose reference range applies only to samples taken after fasting for at least 8 hours.  BUN 8 - 23 mg/dL 17   Creatinine, Ser 0.44 - 1.00 mg/dL 0.85   Calcium 8.9 - 10.3 mg/dL 8.9   Total Protein 6.5 - 8.1 g/dL 7.3   Albumin 3.5 - 5.0 g/dL 3.9   AST 15 - 41 U/L 24   ALT 0 - 44 U/L 13   Alkaline Phosphatase 38 - 126 U/L 73   Total Bilirubin 0.3 - 1.2 mg/dL 0.5   GFR, Estimated >60 mL/min >60         Review of Systems  Constitutional: Negative for chills and fever.  Respiratory: Negative for cough.          Objective:   Physical Exam Exam conducted with a chaperone present.  Constitutional:      Appearance: Normal appearance.  Cardiovascular:     Rate and Rhythm: Normal rate and regular rhythm.     Pulses: Normal pulses.     Heart sounds: Normal heart sounds.  Pulmonary:     Effort: Pulmonary effort is normal.  Breath sounds: Normal breath sounds.  Chest:  Breasts:    Right: Normal.     Left: Inverted nipple: 10 oclock, 1.5 cm firm mass consistent with lipoma.        Comments: Minimal visible asymmetry between the right and the left breast.  Right chest wall port sites have healed very well.  None of the thoracoscope port sites are in the breast area. Musculoskeletal:     Cervical back: Neck supple.  Lymphadenopathy:     Upper Body:     Right upper body: No supraclavicular or axillary adenopathy.     Left upper body: No supraclavicular or axillary adenopathy.  Skin:    General: Skin is warm and dry.  Neurological:     Mental Status: She is alert and oriented to person, place, and time.  Psychiatric:        Mood and Affect: Mood normal.        Behavior: Behavior normal.           Assessment:     Clinical stage I carcinoma of the right breast.    Plan:     Approximately an hour was spent reviewing options for management including breast conservation versus mastectomy.  Equivalency of  the procedures was reviewed.  Presently, without receptors data's report it is hard to predict whether she will be a candidate for adjuvant treatment.  Considering the low-grade lesion, this is likely due to be ER positive, HER2 negative and wide excision, radiation and antiestrogen blockade will be appropriate.   While the patient is presently on estrogen supplementation, I would not discontinue this until after surgery to minimize sleeplessness or irritability in the preoperative timeframe.   She has had good results from her lung surgery without clinical evidence of pulmonary compromise.   Informational brochure as well as appropriate length website information was provided.   Opportunity for second surgical opinion was reviewed.    This note is partially prepared by Ledell Noss, CMA acting as a scribe in the presence of Dr. Hervey Ard, MD.    The documentation recorded by the scribe accurately reflects the service I personally performed and the decisions made by me.    Robert Bellow, MD FACS

## 2021-02-26 ENCOUNTER — Inpatient Hospital Stay: Payer: Medicare HMO | Attending: Internal Medicine | Admitting: Internal Medicine

## 2021-02-26 ENCOUNTER — Other Ambulatory Visit: Payer: Self-pay

## 2021-02-26 DIAGNOSIS — Z85118 Personal history of other malignant neoplasm of bronchus and lung: Secondary | ICD-10-CM | POA: Diagnosis present

## 2021-02-26 DIAGNOSIS — C3411 Malignant neoplasm of upper lobe, right bronchus or lung: Secondary | ICD-10-CM | POA: Diagnosis not present

## 2021-02-26 NOTE — Progress Notes (Signed)
Patient here for follow up. Patient wishes to discuss radiation and the effects it will have on her lungs. Patient reports she recently had mammograms, [place], and Biopsy completed. States she had appt. With surgeon yesterday. Surgery is scheduled for Friday, 03/05/21.

## 2021-02-26 NOTE — Assessment & Plan Note (Addendum)
#  Lower outer quadrant RIGHT BREAST, 8:00 8CMFN; ULTRASOUND-GUIDED BIOPSY/Lower outer- Receptor-Pending- - INVASIVE MAMMARY CARCINOMA, NO SPECIAL TYPE; grade 1;    #Patient currently awaiting surgery next week with Dr. Bary Castilla.   #I discussed the treatment options of breast cancer including-surgery; adjuvant radiation/chemotherapy/antiestrogen therapy.  Given the grade 1/small size of the tumor-unlikely patient will benefit from systemic chemotherapy.  However final decisions to be made after surgery/pathology available.  With regards to radiation-options include MammoSite/WBRT-awaiting breast profile.  Discussed with Dr. Bary Castilla.  #  RIGHT UPPER LOBE LUNG CA-stage I-status post resection clear margins-DEC 2022-stable exam no evidence of any progressive disease/recurrent disease.  Reasonable to switch over to annual imaging.  # Hx of gastric Bypass-no anemia- STABLE.   # mild hypokalemia: K 3.3-likely secondary to triamterene hydrochlorothiazide.  Recommend increased dietary intake of potassium rich foods.  # Barrets [EGD; Dr.Toledo- June 2022]  On PP1 EGD q 3 years- STABLE.  # Obesity: on Ozmepic- lost weight 25 pounds.   # DISPOSITION: # follow up in 3 weeks-  MD;NO labs-r.B

## 2021-02-26 NOTE — Progress Notes (Signed)
[place] NOTE  Patient Care Team: Tracie Harrier, MD as PCP - General (Internal Medicine) Telford Nab, RN as Oncology Nurse Navigator [name], MD as Consulting Physician (Cardiothoracic Surgery) Cammie Sickle, MD as Consulting Physician (Internal Medicine) Nestor Lewandowsky, MD (Inactive) as Referring Physician (Cardiothoracic Surgery) Theodore Demark, RN as Oncology Nurse Navigator  CHIEF COMPLAINTS/PURPOSE OF CONSULTATION: lung cancer  #  Oncology History Overview Note  # JAN 2021- ADENOCA [Dr.A; [place] 2021-]DEC 2020- 13 mm lobular somewhat branching right upper lobe lesion is not hypermetabolic.Number of Lymph Nodes Examined: 17 ; Pathologic Stage Classification (pTNM, AJCC 8th Edition): pT1c, pN0 [Dr.Henderickson]; STAGE I-adenocarcinoma; no adjuvant therapy  # Hx of barrets [[facility] -[facility]]  # Hx of garstic bypass [1970s]  # SURVIVORSHIP: P  # GENETICS: NA  DIAGNOSIS: lung cancer  STAGE:  I       ;  GOALS: cure  CURRENT/MOST RECENT THERAPY : Surveillance    Malignant neoplasm of right upper lobe of lung ([place])  03/08/2019 Initial Diagnosis   Malignant neoplasm of right upper lobe of lung ([place] [facility])   04/03/2019 Cancer Staging   Staging form: Lung, AJCC 8th Edition - Clinical stage from 04/03/2019: ycT1, cN0, cM0 - Signed by [name], MD on 04/03/2019     HISTORY OF PRESENTING ILLNESS:  Glenda Gomez 71 y.o.  female with right upper lobe stage I lung cancer is here for follow-up/review results of the CT scan.  In the interim patient noted to have an abnormal mammogram s/p evaluation with diagnostic mammogram/ultrasound-s/p biopsy.  Patient s/p evaluation with Dr. Bary Castilla.  Awaiting surgery next week.  Patient currently on hormonal replacement given the history of hot flashes. Denies any worsening shortness of breath or cough.  Appetite is good.  No weight loss no headaches  Review of Systems  Constitutional:  Positive  for malaise/fatigue. Negative for chills, diaphoresis, fever and weight loss.  HENT:  Negative for nosebleeds and sore throat.   Eyes:  Negative for double vision.  Respiratory:  Negative for hemoptysis, sputum production, shortness of breath and wheezing.   Cardiovascular:  Negative for chest pain, palpitations, orthopnea and leg swelling.  Gastrointestinal:  Negative for abdominal pain, blood in stool, constipation, diarrhea, heartburn, melena, nausea and vomiting.  Genitourinary:  Negative for dysuria, frequency and urgency.  Musculoskeletal:  Positive for back pain and joint pain.  Skin: Negative.  Negative for itching and rash.  Neurological:  Negative for tingling and focal weakness.  Endo/Heme/Allergies:  Does not bruise/bleed easily.  Psychiatric/Behavioral:  Negative for depression. The patient is not nervous/anxious and does not have insomnia.     MEDICAL HISTORY:  Past Medical History:  Diagnosis Date   Adenocarcinoma ([place])    right upper lobe   Arthritis    Chicken pox    Dyspnea    Environmental and seasonal allergies    Fibromyalgia    GERD (gastroesophageal reflux disease)    Hemorrhoid    Hypersomnia    Hypertension    Hypothyroidism    Lung cancer ([place])    Malignant neoplasm of right upper lobe of lung ([place]) 03/08/2019   Myositis ossificans    Osteoarthritis    Thyroid disease     SURGICAL HISTORY: Past Surgical History:  Procedure Laterality Date    bengin tongue lesion     Benign   ABDOMINAL HYSTERECTOMY     BACK SURGERY     bladder tack     BREAST BIOPSY Left  neg   BREAST BIOPSY Right 02/22/2021   u/s bc ribbon 8:00 path pending   CHOLECYSTECTOMY     COLONOSCOPY WITH PROPOFOL N/A 03/24/2017   Procedure: COLONOSCOPY WITH PROPOFOL;  Surgeon: Lollie Sails, MD;  Location: [facility] ENDOSCOPY;  Service: Endoscopy;  Laterality: N/A;   COLONOSCOPY WITH PROPOFOL N/A 07/22/2020   Procedure: COLONOSCOPY WITH PROPOFOL;  Surgeon: Toledo, Benay Pike, MD;   Location: [facility] ENDOSCOPY;  Service: Gastroenterology;  Laterality: N/A;   ESOPHAGOGASTRODUODENOSCOPY (EGD) WITH PROPOFOL N/A 03/24/2017   Procedure: ESOPHAGOGASTRODUODENOSCOPY (EGD) WITH PROPOFOL;  Surgeon: Lollie Sails, MD;  Location[facility] ENDOSCOPY;  Service: Endoscopy;  Laterality: N/A;   ESOPHAGOGASTRODUODENOSCOPY (EGD) WITH PROPOFOL N/A 07/22/2020   Procedure: ESOPHAGOGASTRODUODENOSCOPY (EGD) WITH PROPOFOL;  Surgeon: Toledo, Benay Pike, MD;  Location: [facility] ENDOSCOPY;  Service: Gastroenterology;  Laterality: N/A;   GASTRIC BYPASS OPEN     hemrrhoidectomy     INTERCOSTAL NERVE BLOCK Right 03/29/2019   Procedure: Intercostal Nerve Block;  Surgeon: [name], MD;  Location: [facility];  Service: Thoracic;  Laterality: Right;   JOINT REPLACEMENT Bilateral    TKR   KNEE ARTHROPLASTY Right 07/06/2016   Procedure: COMPUTER ASSISTED TOTAL KNEE ARTHROPLASTY;  Surgeon: Dereck Leep, MD;  Location: [facility] ORS;  Service: Orthopedics;  Laterality: Right;   KNEE ARTHROSCOPY     LOBECTOMY Right 03/29/2019   XI ROBOTIC ASSISTED THORASCOPY- RIGHT UPPER LOBECTOMY (Right Chest)   NODE DISSECTION  03/29/2019   Procedure: Node Dissection;  Surgeon: [name], MD;  Location: [facility];  Service: Thoracic;;   plantar fascia Left    SHOULDER ARTHROSCOPY WITH DEBRIDEMENT AND BICEP TENDON REPAIR Left 09/25/2018   Procedure: SHOULDER ARTHROSCOPY WITH DEBRIDEMENT DECOMPRESSION AND BICEP TENDON REPAIR - SCRUB [place];  Surgeon: Corky Mull, MD;  Location: [facility] ORS;  Service: Orthopedics;  Laterality: Left;   TONSILLECTOMY     VIDEO BRONCHOSCOPY WITH ENDOBRONCHIAL NAVIGATION N/A 03/04/2019   Procedure: VIDEO BRONCHOSCOPY WITH ENDOBRONCHIAL NAVIGATION;  Surgeon: Ottie Glazier, MD;  Location: [facility] ORS;  Service: Thoracic;  Laterality: N/A;   VIDEO BRONCHOSCOPY WITH ENDOBRONCHIAL ULTRASOUND N/A 03/04/2019   Procedure: VIDEO BRONCHOSCOPY WITH ENDOBRONCHIAL ULTRASOUND;  Surgeon: Ottie Glazier, MD;   Location: [facility] ORS;  Service: Thoracic;  Laterality: N/A;    SOCIAL HISTORY: Social History   Socioeconomic History   Marital status: Divorced    Spouse name: Not on file   Number of children: Not on file   Years of education: Not on file   Highest education level: Not on file  Occupational History   Not on file  Tobacco Use   Smoking status: Former    Packs/day: 2.00    Years: 10.00    Pack years: 20.00    Types: Cigarettes    Quit date: 07/17/1993    Years since quitting: 27.6   Smokeless tobacco: Never  Vaping Use   Vaping Use: Never used  Substance and Sexual Activity   Alcohol use: No   Drug use: No   Sexual activity: Not on file  Other Topics Concern   Not on file  Social History Narrative   1ppd- quit > 20 years ago; no alcohol; in [place]; lives self. retd- cook.    Social Determinants of Health   Financial Resource Strain: Not on file  Food Insecurity: Not on file  Transportation Needs: Not on file  Physical Activity: Not on file  Stress: Not on file  Social Connections: Not on file  Intimate Partner Violence: Not on file  FAMILY HISTORY: Family History  Problem Relation Age of Onset   Breast cancer Sister 51   Breast cancer Maternal Aunt 37   Lung cancer Maternal Aunt    Breast cancer Sister 11   Alzheimer's disease Mother    Arthritis Mother    Diabetes Mother    Heart failure Brother    Pancreatic cancer Brother 55   Lung cancer Brother    Liver cancer Father     ALLERGIES:  is allergic to flagyl [metronidazole], phentermine, and pregabalin.  MEDICATIONS:  Current Outpatient Medications  Medication Sig Dispense Refill   ALPRAZolam (XANAX) 0.25 MG tablet Take 0.25 mg by mouth daily as needed for anxiety.     ascorbic acid (VITAMIN C) 1000 MG tablet Take 1,000 mg by mouth daily.     estradiol (ESTRACE) 0.5 MG tablet Take 0.5 mg by mouth daily.     levothyroxine (SYNTHROID) 88 MCG tablet Take 88 mcg by mouth daily before breakfast.      lidocaine-prilocaine (EMLA) cream Apply to areola one hour prior to arrival day of procedure. Cover with saran wrap.     omeprazole (PRILOSEC) 20 MG capsule Take 20 mg by mouth 2 (two) times daily before a meal.      Semaglutide (OZEMPIC, 0.25 OR 0.5 MG/DOSE, May) Inject 0.25 mg into the skin every Sunday.     triamterene-hydrochlorothiazide (MAXZIDE-25) 37.5-25 MG tablet Take 1 tablet by mouth daily.     Vitamin D, Ergocalciferol, (DRISDOL) 1.25 MG (50000 UNIT) CAPS capsule Take 50,000 Units by mouth once a week.      zolpidem (AMBIEN) 5 MG tablet Take 5-7.5 mg by mouth at bedtime.     celecoxib (CELEBREX) 200 MG capsule Take 1 capsule (200 mg total) by mouth 2 (two) times daily with a meal. (Patient not taking: Reported on 02/26/2021) 60 capsule 1   furosemide (LASIX) 20 MG tablet Take 20 mg by mouth daily as needed for fluid or edema.     Multiple Vitamins-Minerals (PRESERVISION AREDS 2 PO) Take 1 capsule by mouth in the morning and at bedtime.     No current facility-administered medications for this visit.      Marland Kitchen  PHYSICAL EXAMINATION: ECOG PERFORMANCE STATUS: 0 - Asymptomatic  Vitals:   02/26/21 0850  BP: (!) 151/83  Pulse: 65  Resp: 18  Temp: 97.8 F (36.6 C)   Filed Weights   02/26/21 0850  Weight: 235 lb 12.8 oz (107 kg)    Physical Exam Constitutional:      Comments: Obese. She is walking herself.  HENT:     Head: Normocephalic and atraumatic.     Mouth/Throat:     Pharynx: No oropharyngeal exudate.  Eyes:     Pupils: Pupils are equal, round, and reactive to light.  Cardiovascular:     Rate and Rhythm: Normal rate and regular rhythm.  Pulmonary:     Effort: Pulmonary effort is normal. No respiratory distress.     Breath sounds: Normal breath sounds. No wheezing.  Abdominal:     General: Bowel sounds are normal. There is no distension.     Palpations: Abdomen is soft. There is no mass.     Tenderness: There is no abdominal tenderness. There is no guarding or  rebound.  Musculoskeletal:        General: No tenderness. Normal range of motion.     Cervical back: Normal range of motion and neck supple.  Skin:    General: Skin is warm.  Neurological:  Mental Status: She is alert and oriented to person, place, and time.  Psychiatric:        Mood and Affect: Affect normal.   LABORATORY DATA:  I have reviewed the data as listed Lab Results  Component Value Date   WBC 9.2 02/01/2021   HGB 14.2 02/01/2021   HCT 45.3 02/01/2021   MCV 82.7 02/01/2021   PLT 319 02/01/2021   Recent Labs    03/05/20 1021 07/03/20 1056 07/31/20 0937 02/01/21 1432  NA 137  --  137 136  K 3.2*  --  3.8 3.3*  CL 99  --  103 99  CO2 29  --  26 28  GLUCOSE 97  --  87 110*  BUN 11  --  12 17  CREATININE 0.91 0.90 0.82 0.85  CALCIUM 8.9  --  9.2 8.9  GFRNONAA >60  --  >60 >60  PROT 7.1  --  7.5 7.3  ALBUMIN 3.9  --  3.9 3.9  AST 25  --  25 24  ALT 15  --  15 13  ALKPHOS 70  --  70 73  BILITOT 0.7  --  0.6 0.5    RADIOGRAPHIC STUDIES: I have personally reviewed the radiological images as listed and agreed with the findings in the report. CT Chest Wo Contrast  Result Date: 01/29/2021 CLINICAL DATA:  Non-small-cell lung cancer.  Restaging. EXAM: CT CHEST WITHOUT CONTRAST TECHNIQUE: Multidetector CT imaging of the chest was performed following the standard protocol without IV contrast. COMPARISON:  07/03/2020 FINDINGS: Cardiovascular: The heart size is normal. No substantial pericardial effusion. Mild atherosclerotic calcification is noted in the wall of the thoracic aorta. Mediastinum/Nodes: No mediastinal lymphadenopathy. No evidence for gross hilar lymphadenopathy although assessment is limited by the lack of intravenous contrast on the current study. The esophagus has normal imaging features. There is no axillary lymphadenopathy. Lungs/Pleura: Stable volume loss and postsurgical change right hemithorax 3 mm right lower lobe nodule on 65/3 is stable. 3 mm  subpleural nodule in the lingula (82/3 is unchanged. Additional tiny subpleural nodules noted left hemithorax, no new suspicious pulmonary nodule or mass. No focal airspace consolidation. No pleural effusion. Upper Abdomen: 2.5 cm exophytic cyst upper pole right kidney is stable patient is status post gastric bypass. Musculoskeletal: No worrisome lytic or sclerotic osseous abnormality. IMPRESSION: 1. Stable exam. No new or progressive findings to suggest recurrent or metastatic disease. 2. Stable volume loss and postsurgical change right hemithorax. 3. Tiny bilateral pulmonary nodules, unchanged in the interval. 4. Aortic Atherosclerosis (ICD10-I70.0). Electronically Signed   By: Misty Stanley M.D.   On: 01/29/2021 10:45   US BREAST LTD UNI RIGHT INC AXILLA  Result Date: 02/11/2021 CLINICAL DATA:  Patient returns after screening study for evaluation of possible RIGHT breast mass. EXAM: DIGITAL DIAGNOSTIC UNILATERAL RIGHT MAMMOGRAM WITH TOMOSYNTHESIS AND CAD; ULTRASOUND RIGHT BREAST LIMITED TECHNIQUE: Right digital diagnostic mammography and breast tomosynthesis was performed. The images were evaluated with computer-aided detection.; Targeted ultrasound examination of the right breast was performed COMPARISON:  Previous exam(s). ACR Breast Density Category b: There are scattered areas of fibroglandular density. FINDINGS: Additional 2-D and 3-D images are performed. These views confirm presence of a spiculated mass in the LOWER OUTER QUADRANT of RIGHT breast. Targeted ultrasound is performed, showing an oval mass with spiculated margins in the 8 o'clock location of the RIGHT breast 8 centimeters from the nipple. Mass is taller than wide and measures 0.6 x 0.7 x 0.5 centimeters. No internal blood flow  identified on Doppler evaluation. Evaluation of the RIGHT axilla is negative for adenopathy. IMPRESSION: Suspicious mass in the 8 o'clock location of the RIGHT breast. RECOMMENDATION: Ultrasound-guided core biopsy of  RIGHT breast mass. I have discussed the findings and recommendations with the patient. If applicable, a reminder letter will be sent to the patient regarding the next appointment. BI-RADS CATEGORY  4: Suspicious. Electronically Signed   By: Nolon Nations M.D.   On: 02/11/2021 10:33  MM DIAG BREAST TOMO UNI RIGHT  Result Date: 02/11/2021 CLINICAL DATA:  Patient returns after screening study for evaluation of possible RIGHT breast mass. EXAM: DIGITAL DIAGNOSTIC UNILATERAL RIGHT MAMMOGRAM WITH TOMOSYNTHESIS AND CAD; ULTRASOUND RIGHT BREAST LIMITED TECHNIQUE: Right digital diagnostic mammography and breast tomosynthesis was performed. The images were evaluated with computer-aided detection.; Targeted ultrasound examination of the right breast was performed COMPARISON:  Previous exam(s). ACR Breast Density Category b: There are scattered areas of fibroglandular density. FINDINGS: Additional 2-D and 3-D images are performed. These views confirm presence of a spiculated mass in the LOWER OUTER QUADRANT of RIGHT breast. Targeted ultrasound is performed, showing an oval mass with spiculated margins in the 8 o'clock location of the RIGHT breast 8 centimeters from the nipple. Mass is taller than wide and measures 0.6 x 0.7 x 0.5 centimeters. No internal blood flow identified on Doppler evaluation. Evaluation of the RIGHT axilla is negative for adenopathy. IMPRESSION: Suspicious mass in the 8 o'clock location of the RIGHT breast. RECOMMENDATION: Ultrasound-guided core biopsy of RIGHT breast mass. I have discussed the findings and recommendations with the patient. If applicable, a reminder letter will be sent to the patient regarding the next appointment. BI-RADS CATEGORY  4: Suspicious. Electronically Signed   By: Nolon Nations M.D.   On: 02/11/2021 10:33  MM 3D SCREEN BREAST BILATERAL  Result Date: 02/02/2021 CLINICAL DATA:  Screening. EXAM: DIGITAL SCREENING BILATERAL MAMMOGRAM WITH TOMOSYNTHESIS AND CAD  TECHNIQUE: Bilateral screening digital craniocaudal and mediolateral oblique mammograms were obtained. Bilateral screening digital breast tomosynthesis was performed. The images were evaluated with computer-aided detection. COMPARISON:  Previous exam(s). ACR Breast Density Category b: There are scattered areas of fibroglandular density. FINDINGS: In the right breast, a possible mass warrants further evaluation. In the left breast, no findings suspicious for malignancy. IMPRESSION: Further evaluation is suggested for a possible mass in the right breast. RECOMMENDATION: Diagnostic mammogram and possibly ultrasound of the right breast. (Code:[id number]) The patient will be contacted regarding the findings, and additional imaging will be scheduled. BI-RADS CATEGORY  0: Incomplete. Need additional imaging evaluation and/or prior mammograms for comparison. Electronically Signed   By: Audie Pinto M.D.   On: 02/02/2021 15:54  MM CLIP PLACEMENT RIGHT  Result Date: 02/22/2021 CLINICAL DATA:  Patient is post ultrasound-guided core needle biopsy of a 7 mm spiculated mass over the 8 o'clock position of the right breast. EXAM: 3D DIAGNOSTIC RIGHT MAMMOGRAM POST ULTRASOUND BIOPSY COMPARISON:  Previous exam(s). FINDINGS: 3D Mammographic images were obtained following ultrasound guided biopsy of the targeted mass at the 8 o'clock position. The biopsy marking clip is in expected position at the site of biopsy. IMPRESSION: Appropriate positioning of the ribbon shaped biopsy marking clip at the site of biopsy in the outer lower right breast. Final Assessment: Post Procedure Mammograms for Marker Placement Electronically Signed   By: Marin Olp M.D.   On: 02/22/2021 08:47  [place] RT BREAST BX W LOC DEV 1ST LESION IMG BX SPEC US GUIDE  Addendum Date: 02/23/2021   ADDENDUM REPORT: 02/23/2021 14:09  ADDENDUM: PATHOLOGY revealed: A. RIGHT BREAST, 8:00 o'clock, 8 CMFN; ULTRASOUND-GUIDED BIOPSY: - INVASIVE MAMMARY CARCINOMA, NO  SPECIAL TYPE. Size of invasive carcinoma: 7 mm in this sample. Grade 1. Ductal carcinoma in situ: Present, intermediate grade. Lymphovascular invasion: Not identified. Comment: The definitive grade will be assigned on the excisional specimen. Pathology results are CONCORDANT with imaging findings, per Dr. Marin Olp. Pathology results and recommendations were discussed with patient via telephone on 02/23/2021. Patient reported biopsy site doing well with no adverse symptoms, and only slight tenderness at the site. Post biopsy care instructions were reviewed, questions were answered and my direct phone number was provided. Patient was instructed to call [facility] for any additional questions or concerns related to biopsy site. RECOMMENDATION: Surgical consultation. Patient requested surgical consultation with Dr. Ollen Bowl, and oncology consultation with Dr. Charlaine Dalton. Patient's request was relayed to Al Pimple RN and Tanya Nones RN at [facility] by Electa Sniff RN on 02/23/2021. Pathology results reported by Electa Sniff RN on 02/23/2021. Electronically Signed   By: Marin Olp M.D.   On: 02/23/2021 14:09   Result Date: 02/23/2021 CLINICAL DATA:  Since for ultrasound-guided core needle biopsy of an 7 mm spiculated mass over the 8 o'clock position of the right breast 8 cm from the nipple. EXAM: ULTRASOUND GUIDED RIGHT BREAST CORE NEEDLE BIOPSY COMPARISON:  Previous exam(s). PROCEDURE: I met with the patient and we discussed the procedure of ultrasound-guided biopsy, including benefits and alternatives. We discussed the high likelihood of a successful procedure. We discussed the risks of the procedure, including infection, bleeding, tissue injury, clip migration, and inadequate sampling. Informed written consent was given. The usual time-out protocol was performed immediately prior to the procedure. Lesion quadrant: Right outer lower quadrant. Using sterile  technique and 1% Lidocaine as local anesthetic, under direct ultrasound visualization, a 12 gauge spring-loaded device was used to perform biopsy of the targeted mass at the 8 o'clock position using a appear to superior approach. At the conclusion of the procedure a ribbon shaped tissue marker clip was deployed into the biopsy cavity. Follow up 2 view mammogram was performed and dictated separately. IMPRESSION: Ultrasound guided biopsy of a suspicious 7 mm mass over the 8 o'clock position of the right breast. No apparent complications. Electronically Signed: By: Marin Olp M.D. On: 02/22/2021 08:29     ASSESSMENT & PLAN:   Malignant neoplasm of right upper lobe of lung ([facility]) #Lower outer quadrant RIGHT BREAST, 8:00 8CMFN; ULTRASOUND-GUIDED BIOPSY/Lower outer- Receptor-Pending- - INVASIVE MAMMARY CARCINOMA, NO SPECIAL TYPE; grade 1;    #Patient currently awaiting surgery next week with Dr. Bary Castilla.   #I discussed the treatment options of breast cancer including-surgery; adjuvant radiation/chemotherapy/antiestrogen therapy.  Given the grade 1/small size of the tumor-unlikely patient will benefit from systemic chemotherapy.  However final decisions to be made after surgery/pathology available.  With regards to radiation-options include MammoSite/WBRT-awaiting breast profile.  Discussed with Dr. Bary Castilla.  #  RIGHT UPPER LOBE LUNG CA-stage I-status post resection clear margins-DEC 2022-stable exam no evidence of any progressive disease/recurrent disease.  Reasonable to switch over to annual imaging.  # Hx of gastric Bypass-no anemia.  Check B12 at next visit  # mild hypokalemia: K 3.3-likely secondary to triamterene hydrochlorothiazide.  Recommend increased dietary intake of potassium rich foods.    # Barrets [EGD; Dr.Toledo- June 2022]  On PP1 EGD q 3 years- STABLE.  # Obesity: on Ozmepic- lost weight 25 pounds.   # DISPOSITION: # follow up  in 3 weeks-  MD;NO labs-r.B       All questions  were answered. The patient knows to call the clinic with any problems, questions or concerns.    Cammie Sickle, MD 02/26/2021 1:21 PM

## 2021-03-02 LAB — SURGICAL PATHOLOGY

## 2021-03-03 ENCOUNTER — Other Ambulatory Visit: Payer: Self-pay | Admitting: General Surgery

## 2021-03-03 ENCOUNTER — Other Ambulatory Visit: Payer: Self-pay

## 2021-03-03 ENCOUNTER — Encounter
Admission: RE | Admit: 2021-03-03 | Discharge: 2021-03-03 | Disposition: A | Discharge status: Home / Self Care | Payer: Medicare HMO | Source: Ambulatory Visit | Attending: General Surgery | Admitting: General Surgery

## 2021-03-03 VITALS — Ht 62.0 in | Wt 235.0 lb

## 2021-03-03 DIAGNOSIS — Z8639 Personal history of other endocrine, nutritional and metabolic disease: Secondary | ICD-10-CM

## 2021-03-03 DIAGNOSIS — Z01812 Encounter for preprocedural laboratory examination: Secondary | ICD-10-CM

## 2021-03-03 DIAGNOSIS — C50511 Malignant neoplasm of lower-outer quadrant of right female breast: Secondary | ICD-10-CM

## 2021-03-03 NOTE — Patient Instructions (Addendum)
Your procedure is scheduled on: Friday, January 20 Report to the Registration Desk on the 1st floor of the [facility] at 7:45.  REMEMBER: Instructions that are not followed completely may result in serious medical risk, up to and including death; or upon the discretion of your surgeon and anesthesiologist your surgery may need to be rescheduled.  Do not eat food after midnight the night before surgery.  No gum chewing, lozengers or hard candies.  You may however, drink CLEAR liquids up to 2 hours before you are scheduled to arrive for your surgery. Do not drink anything within 2 hours of your scheduled arrival time.  Clear liquids include: - water  - apple juice without pulp - gatorade (not RED, PURPLE, OR BLUE) - black coffee or tea (Do NOT add milk or creamers to the coffee or tea) Do NOT drink anything that is not on this list.  TAKE THESE MEDICATIONS THE MORNING OF SURGERY WITH A SIP OF WATER:  Alprazolam (xanax) if needed for anxiety Levothyroxine Omeprazole - (take one the night before and one on the morning of surgery - helps to prevent nausea after surgery.)  One week prior to surgery: Stop Anti-inflammatories (NSAIDS) such as Advil, Aleve, Ibuprofen, Motrin, Naproxen, Naprosyn and Aspirin based products such as Excedrin, Goodys Powder, BC Powder. Stop ANY OVER THE COUNTER supplements until after surgery. You may however, continue to take Tylenol if needed for pain up until the day of surgery.  No Alcohol for 24 hours before or after surgery.  No Smoking including e-cigarettes for 24 hours prior to surgery.  No chewable tobacco products for at least 6 hours prior to surgery.  No nicotine patches on the day of surgery.  Do not use any "recreational" drugs for at least a week prior to your surgery.  Please be advised that the combination of cocaine and anesthesia may have negative outcomes, up to and including death. If you test positive for cocaine, your surgery will be  cancelled.  On the morning of surgery brush your teeth with toothpaste and water, you may rinse your mouth with mouthwash if you wish. Do not swallow any toothpaste or mouthwash.  Use CHG Soap as directed on instruction sheet.  Do not wear jewelry, make-up, hairpins, clips or nail polish.  Do not wear lotions, powders, or perfumes.   Do not shave body from the neck down 48 hours prior to surgery just in case you cut yourself which could leave a site for infection.  Also, freshly shaved skin may become irritated if using the CHG soap.  Contact lenses, hearing aids and dentures may not be worn into surgery.  Do not bring valuables to the hospital. [facility] is not responsible for any missing/lost belongings or valuables.   Notify your doctor if there is any change in your medical condition (cold, fever, infection).  Wear comfortable clothing (specific to your surgery type) to the hospital.  After surgery, you can help prevent lung complications by doing breathing exercises.  Take deep breaths and cough every 1-2 hours. Your doctor may order a device called an Incentive Spirometer to help you take deep breaths.  If you are being discharged the day of surgery, you will not be allowed to drive home. You will need a responsible adult (18 years or older) to drive you home and stay with you that night.   If you are taking public transportation, you will need to have a responsible adult (18 years or older) with you. Please  confirm with your physician that it is acceptable to use public transportation.   Please call the [facility] Dept. at [phone] if you have any questions about these instructions.  Surgery Visitation Policy:  Patients undergoing a surgery or procedure may have one family member or support person with them as long as that person is not COVID-19 positive or experiencing its symptoms.  That person may remain in the waiting area during the procedure and  may rotate out with other people.

## 2021-03-04 ENCOUNTER — Encounter
Admission: RE | Admit: 2021-03-04 | Discharge: 2021-03-04 | Disposition: A | Discharge status: Home / Self Care | Payer: Medicare HMO | Source: Ambulatory Visit | Attending: General Surgery | Admitting: General Surgery

## 2021-03-04 DIAGNOSIS — Z01810 Encounter for preprocedural cardiovascular examination: Secondary | ICD-10-CM

## 2021-03-04 DIAGNOSIS — I10 Essential (primary) hypertension: Secondary | ICD-10-CM | POA: Diagnosis not present

## 2021-03-04 DIAGNOSIS — Z01818 Encounter for other preprocedural examination: Secondary | ICD-10-CM | POA: Insufficient documentation

## 2021-03-04 DIAGNOSIS — Z8639 Personal history of other endocrine, nutritional and metabolic disease: Secondary | ICD-10-CM

## 2021-03-04 DIAGNOSIS — Z01812 Encounter for preprocedural laboratory examination: Secondary | ICD-10-CM

## 2021-03-04 LAB — POTASSIUM: Potassium: 3.2 mmol/L — ABNORMAL LOW (ref 3.5–5.1)

## 2021-03-04 MED ORDER — CHLORHEXIDINE GLUCONATE CLOTH 2 % EX PADS: 6.0000 | MEDICATED_PAD | Freq: Once | CUTANEOUS | Status: DC

## 2021-03-04 MED ORDER — CHLORHEXIDINE GLUCONATE 0.12 % MT SOLN: 15.0000 mL | Freq: Once | OROMUCOSAL | Status: AC

## 2021-03-04 MED ORDER — LACTATED RINGERS IV SOLN: INTRAVENOUS | Status: DC

## 2021-03-04 MED ORDER — ORAL CARE MOUTH RINSE: 15.0000 mL | Freq: Once | OROMUCOSAL | Status: AC

## 2021-03-05 ENCOUNTER — Encounter
Admission: RE | Disposition: A | Discharge status: Home / Self Care | Payer: Self-pay | Source: Home / Self Care | Attending: General Surgery

## 2021-03-05 ENCOUNTER — Ambulatory Visit: Payer: Medicare HMO | Admitting: Anesthesiology

## 2021-03-05 ENCOUNTER — Ambulatory Visit
Admission: RE | Admit: 2021-03-05 | Discharge: 2021-03-05 | Disposition: A | Discharge status: Home / Self Care | Payer: Medicare HMO | Attending: General Surgery | Admitting: General Surgery

## 2021-03-05 ENCOUNTER — Other Ambulatory Visit: Payer: Self-pay

## 2021-03-05 ENCOUNTER — Ambulatory Visit
Admission: RE | Admit: 2021-03-05 | Discharge: 2021-03-05 | Disposition: A | Discharge status: Home / Self Care | Payer: Medicare HMO | Source: Ambulatory Visit | Attending: General Surgery | Admitting: General Surgery

## 2021-03-05 DIAGNOSIS — E039 Hypothyroidism, unspecified: Secondary | ICD-10-CM | POA: Diagnosis not present

## 2021-03-05 DIAGNOSIS — Z85118 Personal history of other malignant neoplasm of bronchus and lung: Secondary | ICD-10-CM | POA: Insufficient documentation

## 2021-03-05 DIAGNOSIS — Z87891 Personal history of nicotine dependence: Secondary | ICD-10-CM | POA: Diagnosis not present

## 2021-03-05 DIAGNOSIS — C50511 Malignant neoplasm of lower-outer quadrant of right female breast: Secondary | ICD-10-CM | POA: Diagnosis present

## 2021-03-05 DIAGNOSIS — Z79890 Hormone replacement therapy (postmenopausal): Secondary | ICD-10-CM | POA: Diagnosis not present

## 2021-03-05 DIAGNOSIS — Z9889 Other specified postprocedural states: Secondary | ICD-10-CM

## 2021-03-05 DIAGNOSIS — Z79899 Other long term (current) drug therapy: Secondary | ICD-10-CM | POA: Diagnosis not present

## 2021-03-05 HISTORY — DX: Other specified postprocedural states: Z98.890

## 2021-03-05 HISTORY — PX: BREAST LUMPECTOMY WITH SENTINEL LYMPH NODE BIOPSY: SHX5597

## 2021-03-05 SURGERY — BREAST LUMPECTOMY WITH SENTINEL LYMPH NODE BX
Anesthesia: General | Laterality: Right

## 2021-03-05 MED ORDER — PROPOFOL 10 MG/ML IV BOLUS
INTRAVENOUS | Status: DC | PRN
Start: 2021-03-05 — End: 2021-03-05
  Administered 2021-03-05: 120 mg via INTRAVENOUS

## 2021-03-05 MED ORDER — TECHNETIUM TC 99M TILMANOCEPT KIT
1.1000 | PACK | Freq: Once | INTRAVENOUS | Status: AC | PRN
  Administered 2021-03-05: 1.1 via INTRADERMAL

## 2021-03-05 MED ORDER — FENTANYL CITRATE (PF) 100 MCG/2ML IJ SOLN
INTRAMUSCULAR | Status: AC
  Filled 2021-03-05: qty 2

## 2021-03-05 MED ORDER — STERILE WATER FOR IRRIGATION IR SOLN
Status: DC | PRN
  Administered 2021-03-05: 250 mL

## 2021-03-05 MED ORDER — BUPIVACAINE-EPINEPHRINE (PF) 0.5% -1:200000 IJ SOLN
INTRAMUSCULAR | Status: AC
  Filled 2021-03-05: qty 30

## 2021-03-05 MED ORDER — ONDANSETRON HCL 4 MG/2ML IJ SOLN: 4.0000 mg | Freq: Once | INTRAMUSCULAR | Status: DC | PRN

## 2021-03-05 MED ORDER — LIDOCAINE HCL (CARDIAC) PF 100 MG/5ML IV SOSY
PREFILLED_SYRINGE | INTRAVENOUS | Status: DC | PRN
  Administered 2021-03-05: 100 mg via INTRAVENOUS

## 2021-03-05 MED ORDER — ROCURONIUM BROMIDE 100 MG/10ML IV SOLN
INTRAVENOUS | Status: DC | PRN
  Administered 2021-03-05: 50 mg via INTRAVENOUS

## 2021-03-05 MED ORDER — ACETAMINOPHEN 10 MG/ML IV SOLN
INTRAVENOUS | Status: AC
  Filled 2021-03-05: qty 100

## 2021-03-05 MED ORDER — DEXAMETHASONE SODIUM PHOSPHATE 10 MG/ML IJ SOLN
INTRAMUSCULAR | Status: DC | PRN
  Administered 2021-03-05: 10 mg via INTRAVENOUS

## 2021-03-05 MED ORDER — ACETAMINOPHEN 10 MG/ML IV SOLN
INTRAVENOUS | Status: DC | PRN
  Administered 2021-03-05: 1000 mg via INTRAVENOUS

## 2021-03-05 MED ORDER — LABETALOL HCL 5 MG/ML IV SOLN: 10.0000 mg | INTRAVENOUS | Status: DC | PRN

## 2021-03-05 MED ORDER — ACETAMINOPHEN 10 MG/ML IV SOLN: 1000.0000 mg | Freq: Once | INTRAVENOUS | Status: DC | PRN

## 2021-03-05 MED ORDER — ONDANSETRON HCL 4 MG/2ML IJ SOLN
INTRAMUSCULAR | Status: DC | PRN
Start: 2021-03-05 — End: 2021-03-05
  Administered 2021-03-05 (×2): 4 mg via INTRAVENOUS

## 2021-03-05 MED ORDER — OXYCODONE HCL 5 MG PO TABS
5.0000 mg | ORAL_TABLET | Freq: Once | ORAL | Status: AC | PRN
  Administered 2021-03-05: 5 mg via ORAL

## 2021-03-05 MED ORDER — METHYLENE BLUE 0.5 % INJ SOLN
INTRAVENOUS | Status: DC | PRN
  Administered 2021-03-05: 5 mL via INTRADERMAL

## 2021-03-05 MED ORDER — OXYCODONE HCL 5 MG PO TABS
ORAL_TABLET | ORAL | Status: AC
  Filled 2021-03-05: qty 1

## 2021-03-05 MED ORDER — GLYCOPYRROLATE 0.2 MG/ML IJ SOLN
INTRAMUSCULAR | Status: DC | PRN
  Administered 2021-03-05: .2 mg via INTRAVENOUS

## 2021-03-05 MED ORDER — FENTANYL CITRATE (PF) 100 MCG/2ML IJ SOLN
25.0000 ug | INTRAMUSCULAR | Status: DC | PRN
  Administered 2021-03-05 (×3): 50 ug via INTRAVENOUS

## 2021-03-05 MED ORDER — HYDROCODONE-ACETAMINOPHEN 5-325 MG PO TABS: 1.0000 | ORAL_TABLET | ORAL | 0 refills | Status: DC | PRN

## 2021-03-05 MED ORDER — FENTANYL CITRATE (PF) 100 MCG/2ML IJ SOLN
INTRAMUSCULAR | Status: DC | PRN
  Administered 2021-03-05 (×2): 50 ug via INTRAVENOUS

## 2021-03-05 MED ORDER — MIDAZOLAM HCL 2 MG/2ML IJ SOLN
INTRAMUSCULAR | Status: AC
  Filled 2021-03-05: qty 2

## 2021-03-05 MED ORDER — BUPIVACAINE-EPINEPHRINE (PF) 0.5% -1:200000 IJ SOLN
INTRAMUSCULAR | Status: DC | PRN
  Administered 2021-03-05: 30 mL

## 2021-03-05 MED ORDER — LABETALOL HCL 5 MG/ML IV SOLN
10.0000 mg | Freq: Once | INTRAVENOUS | Status: AC
  Administered 2021-03-05: 10 mg via INTRAVENOUS

## 2021-03-05 MED ORDER — METHYLENE BLUE 0.5 % INJ SOLN
INTRAVENOUS | Status: AC
  Filled 2021-03-05: qty 10

## 2021-03-05 MED ORDER — KETOROLAC TROMETHAMINE 30 MG/ML IJ SOLN
INTRAMUSCULAR | Status: DC | PRN
  Administered 2021-03-05: 15 mg via INTRAVENOUS

## 2021-03-05 MED ORDER — MIDAZOLAM HCL 2 MG/2ML IJ SOLN
INTRAMUSCULAR | Status: DC | PRN
  Administered 2021-03-05: 2 mg via INTRAVENOUS

## 2021-03-05 MED ORDER — OXYCODONE HCL 5 MG/5ML PO SOLN: 5.0000 mg | Freq: Once | ORAL | Status: AC | PRN

## 2021-03-05 MED ORDER — LABETALOL HCL 5 MG/ML IV SOLN
INTRAVENOUS | Status: AC
  Filled 2021-03-05: qty 4

## 2021-03-05 MED ORDER — SUGAMMADEX SODIUM 500 MG/5ML IV SOLN
INTRAVENOUS | Status: DC | PRN
  Administered 2021-03-05: 200 mg via INTRAVENOUS

## 2021-03-05 MED ORDER — CHLORHEXIDINE GLUCONATE 0.12 % MT SOLN
OROMUCOSAL | Status: AC
  Administered 2021-03-05: 15 mL via OROMUCOSAL
  Filled 2021-03-05: qty 15

## 2021-03-05 SURGICAL SUPPLY — 57 items
APL PRP STRL LF DISP 70% ISPRP (MISCELLANEOUS) ×1
BINDER BREAST XLRG (GAUZE/BANDAGES/DRESSINGS) ×1 IMPLANT
BLADE BOVIE TIP EXT 4 (BLADE) ×1 IMPLANT
BLADE SURG 15 STRL SS SAFETY (BLADE) ×4 IMPLANT
BULB RESERV EVAC DRAIN JP 100C (MISCELLANEOUS) IMPLANT
CHLORAPREP W/TINT 26 (MISCELLANEOUS) ×2 IMPLANT
CNTNR SPEC 2.5X3XGRAD LEK (MISCELLANEOUS)
CONT SPEC 4OZ STER OR WHT (MISCELLANEOUS)
CONT SPEC 4OZ STRL OR WHT (MISCELLANEOUS)
CONTAINER SPEC 2.5X3XGRAD LEK (MISCELLANEOUS) IMPLANT
COVER PROBE FLX POLY STRL (MISCELLANEOUS) ×2 IMPLANT
DEVICE DUBIN SPECIMEN MAMMOGRA (MISCELLANEOUS) ×2 IMPLANT
DRAIN CHANNEL JP 15F RND 16 (MISCELLANEOUS) IMPLANT
DRAPE LAPAROTOMY TRNSV 106X77 (MISCELLANEOUS) ×2 IMPLANT
DRSG GAUZE FLUFF 36X18 (GAUZE/BANDAGES/DRESSINGS) ×4 IMPLANT
DRSG TELFA 3X8 NADH (GAUZE/BANDAGES/DRESSINGS) ×2 IMPLANT
ELECT CAUTERY BLADE TIP 2.5 (TIP) ×2
ELECT REM PT RETURN 9FT ADLT (ELECTROSURGICAL) ×2
ELECTRODE CAUTERY BLDE TIP 2.5 (TIP) ×1 IMPLANT
ELECTRODE REM PT RTRN 9FT ADLT (ELECTROSURGICAL) ×1 IMPLANT
GAUZE 4X4 16PLY ~~LOC~~+RFID DBL (SPONGE) ×2 IMPLANT
GLOVE SURG ENC MOIS LTX SZ7.5 (GLOVE) ×2 IMPLANT
GLOVE SURG UNDER LTX SZ8 (GLOVE) ×2 IMPLANT
GOWN STRL REUS W/ TWL LRG LVL3 (GOWN DISPOSABLE) ×2 IMPLANT
GOWN STRL REUS W/TWL LRG LVL3 (GOWN DISPOSABLE) ×4
KIT TURNOVER KIT A (KITS) ×2 IMPLANT
LABEL OR SOLS (LABEL) ×2 IMPLANT
MANIFOLD NEPTUNE II (INSTRUMENTS) ×2 IMPLANT
MARGIN MAP 10MM (MISCELLANEOUS) ×2 IMPLANT
NDL HYPO 25X1 1.5 SAFETY (NEEDLE) ×2 IMPLANT
NEEDLE HYPO 22GX1.5 SAFETY (NEEDLE) ×2 IMPLANT
NEEDLE HYPO 25X1 1.5 SAFETY (NEEDLE) ×4 IMPLANT
PACK BASIN MINOR ARMC (MISCELLANEOUS) ×2 IMPLANT
PAD DRESSING TELFA 3X8 NADH (GAUZE/BANDAGES/DRESSINGS) ×1 IMPLANT
PENCIL ELECTRO HAND CTR (MISCELLANEOUS) ×2 IMPLANT
RETRACTOR RING XSMALL (MISCELLANEOUS) IMPLANT
RTRCTR WOUND ALEXIS 13CM XS SH (MISCELLANEOUS) ×2
SLEVE PROBE SENORX GAMMA FIND (MISCELLANEOUS) ×1 IMPLANT
STRIP CLOSURE SKIN 1/2X4 (GAUZE/BANDAGES/DRESSINGS) ×2 IMPLANT
SUT ETHILON 3-0 FS-10 30 BLK (SUTURE) ×2
SUT SILK 2 0 (SUTURE) ×2
SUT SILK 2-0 18XBRD TIE 12 (SUTURE) ×1 IMPLANT
SUT VIC AB 2-0 CT1 (SUTURE) ×1 IMPLANT
SUT VIC AB 2-0 CT1 27 (SUTURE) ×4
SUT VIC AB 2-0 CT1 TAPERPNT 27 (SUTURE) ×2 IMPLANT
SUT VIC AB 3-0 54X BRD REEL (SUTURE) ×1 IMPLANT
SUT VIC AB 3-0 BRD 54 (SUTURE) ×2
SUT VIC AB 3-0 SH 27 (SUTURE) ×4
SUT VIC AB 3-0 SH 27X BRD (SUTURE) ×2 IMPLANT
SUT VIC AB 4-0 FS2 27 (SUTURE) ×4 IMPLANT
SUTURE EHLN 3-0 FS-10 30 BLK (SUTURE) ×1 IMPLANT
SWABSTK COMLB BENZOIN TINCTURE (MISCELLANEOUS) ×2 IMPLANT
SYR 10ML LL (SYRINGE) ×2 IMPLANT
SYR BULB IRRIG 60ML STRL (SYRINGE) ×2 IMPLANT
TAPE TRANSPORE STRL 2 31045 (GAUZE/BANDAGES/DRESSINGS) ×2 IMPLANT
TRAP NEPTUNE SPECIMEN COLLECT (MISCELLANEOUS) ×2 IMPLANT
WATER STERILE IRR 1000ML POUR (IV SOLUTION) ×2 IMPLANT

## 2021-03-05 NOTE — H&P (Signed)
Glenda Gomez [id number] 03-02-1950     HPI: Healthy woman with early stage breast cancer who desires breast conservation. Tolerated SLN injection well.   Medications Prior to Admission  Medication Sig Dispense Refill Last Dose   ascorbic acid (VITAMIN C) 1000 MG tablet Take 1,000 mg by mouth daily.   Past Week   estradiol (ESTRACE) 0.5 MG tablet Take 0.5 mg by mouth daily.   03/04/2021   furosemide (LASIX) 20 MG tablet Take 20 mg by mouth daily as needed for fluid or edema.      levothyroxine (SYNTHROID) 88 MCG tablet Take 88 mcg by mouth daily before breakfast.   03/05/2021   Multiple Vitamins-Minerals (PRESERVISION AREDS 2 PO) Take 1 capsule by mouth in the morning and at bedtime.   Past Week   omeprazole (PRILOSEC) 20 MG capsule Take 20 mg by mouth 2 (two) times daily before a meal.    03/05/2021   Semaglutide (OZEMPIC, 0.25 OR 0.5 MG/DOSE, [place]) Inject 0.25 mg into the skin every Sunday.   Past Week   triamterene-hydrochlorothiazide (MAXZIDE-25) 37.5-25 MG tablet Take 1 tablet by mouth daily.   03/04/2021   Vitamin D, Ergocalciferol, (DRISDOL) 1.25 MG (50000 UNIT) CAPS capsule Take 50,000 Units by mouth once a week. Friday   Past Week   zolpidem (AMBIEN) 5 MG tablet Take 5-7.5 mg by mouth at bedtime.   03/04/2021   celecoxib (CELEBREX) 200 MG capsule Take 1 capsule (200 mg total) by mouth 2 (two) times daily with a meal. (Patient not taking: Reported on 03/03/2021) 60 capsule 1    lidocaine-prilocaine (EMLA) cream Apply to areola one hour prior to arrival day of procedure. Cover with saran wrap.      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   Arthritis    Chicken pox    Dyspnea    Environmental and seasonal allergies    Fibromyalgia    GERD  (gastroesophageal reflux disease)    Hemorrhoid    Hypersomnia    Hypertension    Hypothyroidism    Lung cancer ([place])    Malignant neoplasm of right upper lobe of lung ([place]) 03/08/2019   Myositis ossificans    Osteoarthritis    Thyroid disease    Past Surgical History:  Procedure Laterality Date    bengin tongue lesion     Benign 1980's   ABDOMINAL HYSTERECTOMY  1979   BACK SURGERY     bladder tack  2011   BREAST BIOPSY Left    neg   BREAST BIOPSY Right 02/22/2021   u/s bc ribbon 8:00 path pending   CHOLECYSTECTOMY  2010   COLONOSCOPY  12/22/2011   COLONOSCOPY WITH PROPOFOL N/A 03/24/2017   Procedure: COLONOSCOPY WITH PROPOFOL;  Surgeon: Lollie Sails, MD;  Location[facility] ENDOSCOPY;  Service: Endoscopy;  Laterality: N/A;   COLONOSCOPY WITH PROPOFOL N/A 07/22/2020   Procedure: COLONOSCOPY WITH PROPOFOL;  Surgeon: Toledo, Benay Pike, MD;  Location: [facility] ENDOSCOPY;  Service: Gastroenterology;  Laterality: N/A;   ESOPHAGOGASTRODUODENOSCOPY (EGD) WITH PROPOFOL N/A 03/24/2017   Procedure: ESOPHAGOGASTRODUODENOSCOPY (EGD) WITH PROPOFOL;  Surgeon: Lollie Sails, MD;  Location: [facility] ENDOSCOPY;  Service: Endoscopy;  Laterality: N/A;   ESOPHAGOGASTRODUODENOSCOPY (EGD) WITH PROPOFOL N/A  07/22/2020   Procedure: ESOPHAGOGASTRODUODENOSCOPY (EGD) WITH PROPOFOL;  Surgeon: Toledo, Benay Pike, MD;  Location: [facility] ENDOSCOPY;  Service: Gastroenterology;  Laterality: N/A;   GASTRIC BYPASS OPEN     4098'[id number] complicated by herniated stomach requiring reversal with mesh   HEMORRHOID SURGERY     INTERCOSTAL NERVE BLOCK Right 03/29/2019   Procedure: Intercostal Nerve Block;  Surgeon: [name], MD;  Location: [facility];  Service: Thoracic;  Laterality: Right;   JOINT REPLACEMENT Bilateral    TKR   KNEE ARTHROPLASTY Right 07/06/2016   Procedure: COMPUTER ASSISTED TOTAL KNEE ARTHROPLASTY;  Surgeon: Dereck Leep, MD;  Location: [facility] ORS;  Service: Orthopedics;  Laterality: Right;   KNEE  ARTHROSCOPY Left 10/19/2006   x 3   KNEE ARTHROSCOPY Right    x2   LOBECTOMY Right 03/29/2019   XI ROBOTIC ASSISTED THORASCOPY- RIGHT UPPER LOBECTOMY (Right Chest)   NODE DISSECTION  03/29/2019   Procedure: Node Dissection;  Surgeon: [name], MD;  Location: [facility];  Service: Thoracic;;   plantar fascia Left    PLANTAR FASCIA RELEASE Right    x 3   POSTERIOR LAMINECTOMY / DECOMPRESSION LUMBAR SPINE  06/24/2015   SHOULDER ARTHROSCOPY WITH DEBRIDEMENT AND BICEP TENDON REPAIR Left 09/25/2018   Procedure: SHOULDER ARTHROSCOPY WITH DEBRIDEMENT DECOMPRESSION AND BICEP TENDON REPAIR - SCRUB TECH;  Surgeon: Corky Mull, MD;  Location: [facility] ORS;  Service: Orthopedics;  Laterality: Left;   TONSILLECTOMY     childhood   TOTAL KNEE ARTHROPLASTY Left 11/17/2004   VIDEO BRONCHOSCOPY WITH ENDOBRONCHIAL NAVIGATION N/A 03/04/2019   Procedure: VIDEO BRONCHOSCOPY WITH ENDOBRONCHIAL NAVIGATION;  Surgeon: Ottie Glazier, MD;  Location: [facility] ORS;  Service: Thoracic;  Laterality: N/A;   VIDEO BRONCHOSCOPY WITH ENDOBRONCHIAL ULTRASOUND N/A 03/04/2019   Procedure: VIDEO BRONCHOSCOPY WITH ENDOBRONCHIAL ULTRASOUND;  Surgeon: Ottie Glazier, MD;  Location: [facility] ORS;  Service: Thoracic;  Laterality: N/A;   Social History   Socioeconomic History   Marital status: Divorced    Spouse name: Not on file   Number of children: Not on file   Years of education: Not on file   Highest education level: Not on file  Occupational History   Not on file  Tobacco Use   Smoking status: Former    Packs/day: 2.00    Years: 10.00    Pack years: 20.00    Types: Cigarettes    Quit date: 07/17/1993    Years since quitting: 27.6   Smokeless tobacco: Never  Vaping Use   Vaping Use: Never used  Substance and Sexual Activity   Alcohol use: No   Drug use: No   Sexual activity: Not on file  Other Topics Concern   Not on file  Social History Narrative   1ppd- quit > 20 years ago; no alcohol; in [place]; 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      ROS: Negative.     PE: HEENT: Negative. Lungs: Clear. Cardio: RR.  Assessment/Plan:  Proceed with planned right breast wide excision and SLN biopsy.   [facility] Doc [place] 03/05/2021

## 2021-03-05 NOTE — Op Note (Addendum)
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  Postoperative diagnosis: Same.  Operative procedure: Right breast wide excision with ultrasound guidance, sentinel node biopsy.  Operating Surgeon: Hervey Ard, MD.  Anesthesia: General endotracheal.  Marcaine 0.5% with 1: 200,000 units of epinephrine, 30 cc.  Estimated blood loss: 5 cc.  Clinical note: This 71 year old woman was recently identified with an abnormal mammogram and biopsy showed evidence of invasive mammary carcinoma.  She desired breast conservation.  She underwent injection with technetium sulfur colloid prior to the procedure.  She had SCD stockings for DVT prevention.  Antibiotic prophylaxis was not indicated.  Operative note: After the induction of general endotracheal anesthesia the breast was cleansed with alcohol and a total of 5 cc of 0.5% methylene blue was injected in subareolar plexus.  The breast was then cleansed with ChloraPrep and draped.  The patient was rolled to the left to provide better exposure of the lower outer quadrant.  Ultrasound was used to identify the tumor mass in the 730 o'clock position.  Image obtained and placed on the chart for permanent record.  Local anesthesia was infiltrated and a radial incision made at this location.  The skin was incised sharply and remaining dissection completed electrocautery.  A 3 x 3 x 4 cm block of primarily the adipose tissue was resected orientated and specimen radiograph showed the clip at the inferior margin of the resection field.  Subsequently reported as clear margins by pathology.  While the breast specimen was being processed attention was turned to the axilla.  The node seeker device was used and showed increased uptake on the anterior aspect of the lower limit of the axilla.  Local anesthesia was infiltrated.  A transverse incision was made in the skin incised.  Dissection was  carried down to the area just above the axillary envelope where an extra small Alexis wound protector was placed.  There was a 4-5 cm depth of adipose tissue in this area.  3 hot, nonblue nodes were identified with counts ranging between 2000 and 5000.  After these were removed no counts were above 300.  No blue lymphatics were seen.  Hemostasis was electrocautery.  The axillary envelope was closed with 2-0 Vicryl figure-of-eight sutures.  The adipose layer was closed in layers in similar fashion.  The skin was closed with a running 4-0 Vicryl subcuticular suture.  Attention was turned to closing the breast wound.  This was easily approximated with multiple layers of 2-0 Vicryl figure-of-eight sutures to obliterate dead space.  The deep dermis was approximated with interrupted 2-0 Vicryl sutures.  The skin closed with a running 4-0 Vicryl subcuticular suture.  Benzoin, Steri-Strips, followed by Telfa, fluff gauze and a compressive wrap was applied.  The patient tolerated procedure well and was taken to the PACU in stable condition.

## 2021-03-05 NOTE — Anesthesia Preprocedure Evaluation (Signed)
Anesthesia Evaluation  Patient identified by MRN, date of birth, ID band Patient awake    Reviewed: Allergy & Precautions, H&P , NPO status , Patient's Chart, lab work & pertinent test results, reviewed documented beta blocker date and time   History of Anesthesia Complications Negative for: history of anesthetic complications  Airway Mallampati: II  TM Distance: >3 FB Neck ROM: full    Dental  (+) Partial Lower, Caps, Implants   Pulmonary shortness of breath and with exertion, neg sleep apnea, neg COPD, Patient abstained from smoking.Not current smoker, former smoker,  Prior pulmonary lobectomy for lung cancer    Pulmonary exam normal breath sounds clear to auscultation       Cardiovascular Exercise Tolerance: Poor METS: 3 - Mets hypertension, On Medications (-) CAD and (-) Past MI Normal cardiovascular exam+ dysrhythmias Atrial Fibrillation  Rhythm:regular Rate:Normal - Systolic murmurs Not on anticoagulation, in sinus rhythm   Neuro/Psych  Neuromuscular disease negative psych ROS   GI/Hepatic Neg liver ROS, GERD  Medicated and Controlled,  Endo/Other  neg diabetesHypothyroidism   Renal/GU negative Renal ROS  negative genitourinary   Musculoskeletal  (+) Arthritis , Fibromyalgia -  Abdominal   Peds  Hematology negative hematology ROS (+)   Anesthesia Other Findings Past Medical History: No date: Adenocarcinoma ([place])     Comment:  right upper lobe No date: Arthritis No date: Chicken pox No date: Dyspnea No date: Environmental and seasonal allergies No date: Fibromyalgia No date: GERD (gastroesophageal reflux disease) No date: Hemorrhoid No date: Hypersomnia No date: Hypertension No date: Hypothyroidism No date: Lung cancer ([place]) 03/08/2019: Malignant neoplasm of right upper lobe of lung ([facility]) 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 No date: Osteoarthritis No date: Thyroid disease Past Surgical History: No date:   bengin tongue lesion     Comment:  Benign No date: ABDOMINAL HYSTERECTOMY No date: BACK SURGERY No date: bladder tack No date: BREAST BIOPSY; Left     Comment:  neg No date: CHOLECYSTECTOMY 03/24/2017: COLONOSCOPY WITH PROPOFOL; N/A     Comment:  Procedure: COLONOSCOPY WITH PROPOFOL;  Surgeon:               Lollie Sails, MD;  Location: [facility] ENDOSCOPY;                Service: Endoscopy;  Laterality: N/A; 03/24/2017: ESOPHAGOGASTRODUODENOSCOPY (EGD) WITH PROPOFOL; N/A     Comment:  Procedure: ESOPHAGOGASTRODUODENOSCOPY (EGD) WITH               PROPOFOL;  Surgeon: Lollie Sails, MD;  Location:               [facility] ENDOSCOPY;  Service: Endoscopy;  Laterality: N/A; No date: GASTRIC BYPASS OPEN No date: hemrrhoidectomy 03/29/2019: INTERCOSTAL NERVE BLOCK; Right     Comment:  Procedure: Intercostal Nerve Block;  Surgeon:               [name], MD;  Location: [place];  Service:               Thoracic;  Laterality: Right; No date: JOINT REPLACEMENT; Bilateral     Comment:  TKR 07/06/2016: KNEE ARTHROPLASTY; Right     Comment:  Procedure: COMPUTER ASSISTED TOTAL KNEE ARTHROPLASTY;                Surgeon: Dereck Leep, MD;  Location: [facility] ORS;                Service: Orthopedics;  Laterality: Right; No date: KNEE  ARTHROSCOPY 03/29/2019: LOBECTOMY; Right     Comment:  XI ROBOTIC ASSISTED THORASCOPY- RIGHT UPPER LOBECTOMY               (Right Chest) 03/29/2019: NODE DISSECTION     Comment:  Procedure: Node Dissection;  Surgeon: [name], MD;  Location: [place];  Service: Thoracic;; No date: plantar fascia; Left 09/25/2018: SHOULDER ARTHROSCOPY WITH DEBRIDEMENT AND BICEP TENDON  REPAIR; Left     Comment:  Procedure: SHOULDER ARTHROSCOPY WITH DEBRIDEMENT               DECOMPRESSION AND BICEP TENDON REPAIR - SCRUB TECH;                Surgeon: Corky Mull, MD;  Location: [facility] ORS;                Service: Orthopedics;  Laterality: Left; No date:  TONSILLECTOMY 03/04/2019: VIDEO BRONCHOSCOPY WITH ENDOBRONCHIAL NAVIGATION; N/A     Comment:  Procedure: VIDEO BRONCHOSCOPY WITH ENDOBRONCHIAL               NAVIGATION;  Surgeon: Ottie Glazier, MD;  Location:               [facility] ORS;  Service: Thoracic;  Laterality: N/A; 03/04/2019: VIDEO BRONCHOSCOPY WITH ENDOBRONCHIAL ULTRASOUND; N/A     Comment:  Procedure: VIDEO BRONCHOSCOPY WITH ENDOBRONCHIAL               ULTRASOUND;  Surgeon: Ottie Glazier, MD;  Location:               [facility] ORS;  Service: Thoracic;  Laterality: N/A; BMI    Body Mass Index: 43.07 kg/m     Reproductive/Obstetrics negative OB ROS                             Anesthesia Physical  Anesthesia Plan  ASA: 3  Anesthesia Plan: General   Post-op Pain Management: Ofirmev IV (intra-op)   Induction: Intravenous  PONV Risk Score and Plan: 3 and Ondansetron, Dexamethasone and Treatment may vary due to age or medical condition  Airway Management Planned: LMA and Oral ETT  Additional Equipment: None  Intra-op Plan:   Post-operative Plan: Extubation in OR  Informed Consent: I have reviewed the patients History and Physical, chart, labs and discussed the procedure including the risks, benefits and alternatives for the proposed anesthesia with the patient or authorized representative who has indicated his/her understanding and acceptance.     Dental Advisory Given  Plan Discussed with: CRNA  Anesthesia Plan Comments: (Discussed risks of anesthesia with patient, including PONV, sore throat, lip/dental/eye damage. Rare risks discussed as well, such as cardiorespiratory and neurological sequelae, and allergic reactions. Discussed the role of CRNA in patient's perioperative care. Patient understands.)        Anesthesia Quick Evaluation

## 2021-03-05 NOTE — Transfer of Care (Signed)
Immediate Anesthesia Transfer of Care Note  Patient: Glenda Gomez  Procedure(s) Performed: BREAST LUMPECTOMY WITH SENTINEL LYMPH NODE BX (Right)  Patient Location: PACU  Anesthesia Type:General  Level of Consciousness: drowsy  Airway & Oxygen Therapy: Patient Spontanous Breathing and Patient connected to face mask oxygen  Post-op Assessment: Report given to RN  Post vital signs: stable  Last Vitals:  Vitals Value Taken Time  BP 181/72 03/05/21 1111  Temp    Pulse 64 03/05/21 1114  Resp 27 03/05/21 1114  SpO2 100 % 03/05/21 1114  Vitals shown include unvalidated device data.  Last Pain:  Vitals:   03/05/21 0840  TempSrc: Oral  PainSc: 0-No pain         Complications: No notable events documented.

## 2021-03-05 NOTE — Anesthesia Procedure Notes (Signed)
Procedure Name: Intubation Date/Time: 03/05/2021 9:40 AM Performed by: Kelton Pillar, CRNA Pre-anesthesia Checklist: Patient identified, Emergency Drugs available, Suction available and Patient being monitored Patient Re-evaluated:Patient Re-evaluated prior to induction Oxygen Delivery Method: Circle system utilized Preoxygenation: Pre-oxygenation with 100% oxygen Induction Type: IV induction Ventilation: Mask ventilation without difficulty Laryngoscope Size: McGraph and 3 Grade View: Grade I Tube type: Oral Tube size: 7.0 mm Number of attempts: 1 Airway Equipment and Method: Stylet and Oral airway Placement Confirmation: ETT inserted through vocal cords under direct vision, positive ETCO2, breath sounds checked- equal and bilateral and CO2 detector Secured at: 21 cm Tube secured with: Tape Dental Injury: Teeth and Oropharynx as per pre-operative assessment

## 2021-03-05 NOTE — Progress Notes (Signed)
Patient has missed blood pressure po medications at home for two days. Pressures have been elevated in PACU up to 200 SBP. Dr. Bertell Maria ordered 10 mg iv Labetolol and was administered by this nurse. BP is now 181/97 (119). Called Dr. Bertell Maria, per MD patient is okay to go home.

## 2021-03-05 NOTE — Discharge Instructions (Signed)

## 2021-03-05 NOTE — Anesthesia Postprocedure Evaluation (Addendum)
Anesthesia Post Note  Patient: Glenda Gomez  Procedure(s) Performed: BREAST LUMPECTOMY WITH SENTINEL LYMPH NODE BX (Right)  Patient location during evaluation: PACU Anesthesia Type: General Level of consciousness: awake and alert Pain management: pain level controlled Vital Signs Assessment: post-procedure vital signs reviewed and stable Respiratory status: spontaneous breathing, nonlabored ventilation, respiratory function stable and patient connected to nasal cannula oxygen Cardiovascular status: blood pressure returned to baseline and stable (Patient s/p labetalol 10mg . Hypertensive. however patient did not take her antihypertensives this AM, and she is asymptomatic. OK for d/c. Advised to go to ER if chest pain, vision cahnges, headache) Postop Assessment: no apparent nausea or vomiting Anesthetic complications: no   No notable events documented.   Last Vitals:  Vitals:   03/05/21 1230 03/05/21 1241  BP: (!) 180/82 (!) 180/80  Pulse: (!) 56 (!) 52  Resp: 10 18  Temp:    SpO2: 99% 97%    Last Pain:  Vitals:   03/05/21 1241  TempSrc: Temporal  PainSc: 0-No pain                 Arita Miss

## 2021-03-06 ENCOUNTER — Encounter: Payer: Self-pay | Admitting: General Surgery

## 2021-03-09 ENCOUNTER — Other Ambulatory Visit: Payer: Self-pay | Admitting: Internal Medicine

## 2021-03-09 LAB — SURGICAL PATHOLOGY

## 2021-03-10 IMAGING — MR MRI OF THE LEFT SHOULDER WITHOUT CONTRAST
5 series · 33 of 40 positions shown · non-contrast
Comparison: None.

CLINICAL DATA: Intermittent left shoulder pain for 2-3 years which
has become constant over the past year. Decreased range of motion.

EXAM:
MRI OF THE LEFT SHOULDER WITHOUT CONTRAST
TECHNIQUE: Multiplanar, multisequence MR imaging of the shoulder was performed.
No intravenous contrast was administered.

[Series 5: PD fat-sat · axial · left · 4.0mm · 0.55mm/px · z∈[-68,+61]mm · 8 of 28 slices shown (1 of 2)]
[im 1/28]
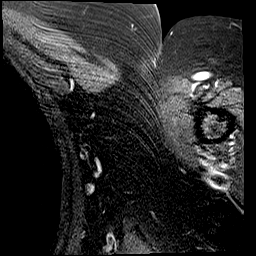
[im 4/28]
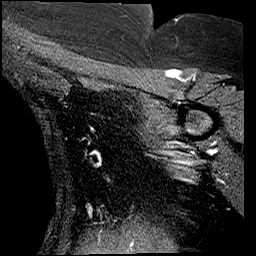
[im 10/28]
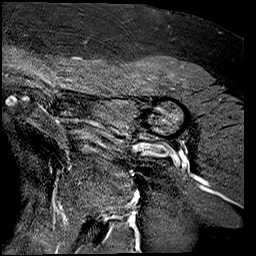
[im 13/28]
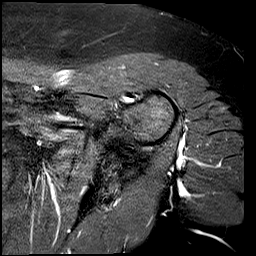
[im 16/28]
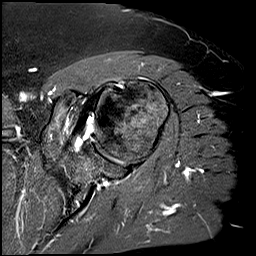
[im 19/28]
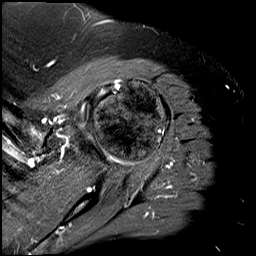
[im 25/28]
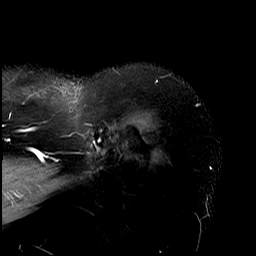
[im 28/28]
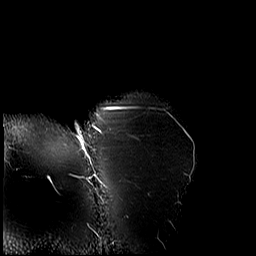

[Series 6: PD fat-sat · oblique · left · 4.0mm · 0.44mm/px · 8 of 26 slices shown (2 of 2)]
[im 1/26]
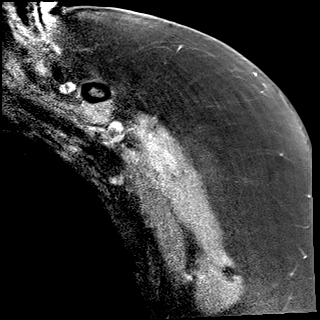
[im 4/26]
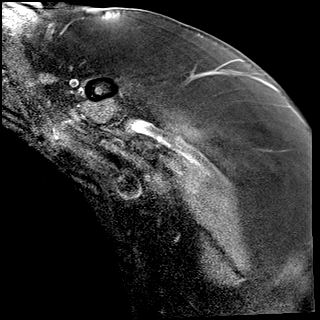
[im 8/26]
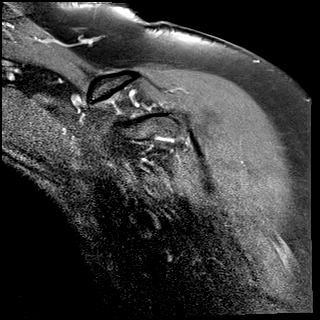
[im 11/26]
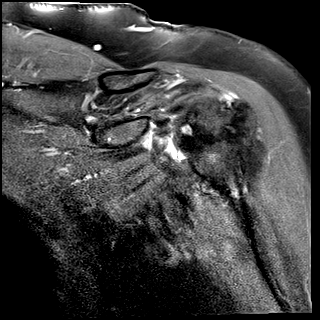
[im 15/26]
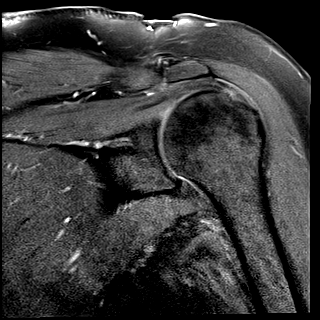
[im 18/26]
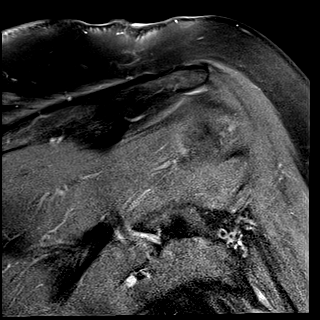
[im 22/26]
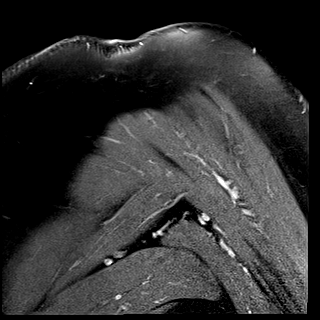
[im 26/26]
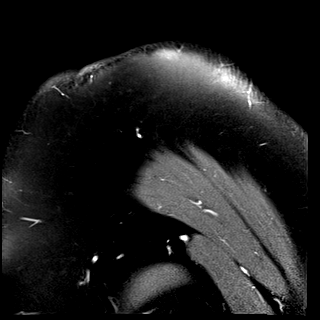

[Series 7: T2 fat-sat · oblique · left · 4.0mm · 0.44mm/px · 8 of 26 slices shown (1 of 2)]
[im 1/26]
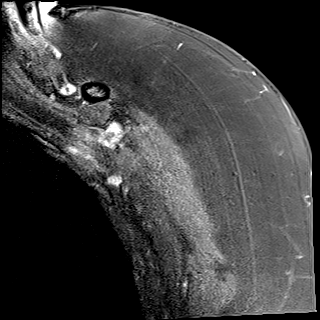
[im 4/26]
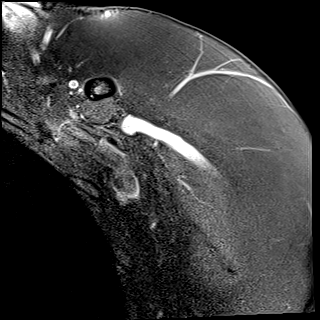
[im 8/26]
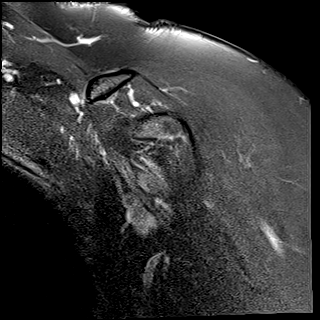
[im 11/26]
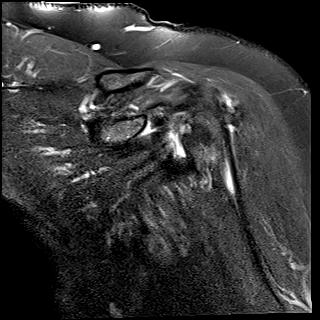
[im 15/26]
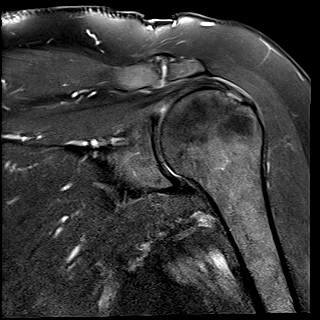
[im 18/26]
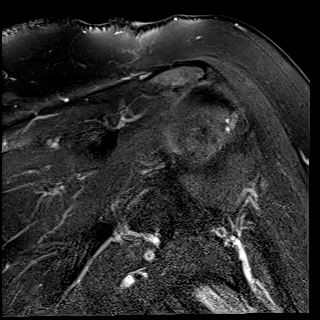
[im 22/26]
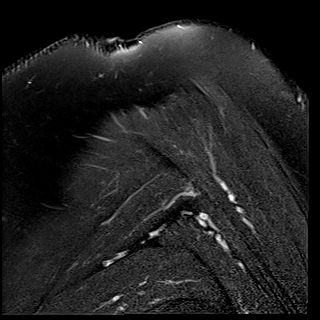
[im 26/26]
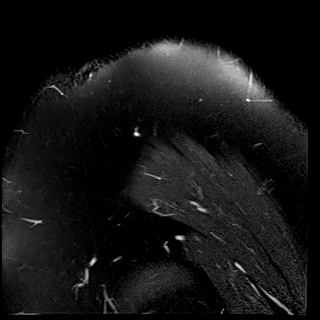

[Series 8: T2 fat-sat · oblique · left · 4.0mm · 0.23mm/px · 7 of 22 slices shown (2 of 2)]
[im 1/22]
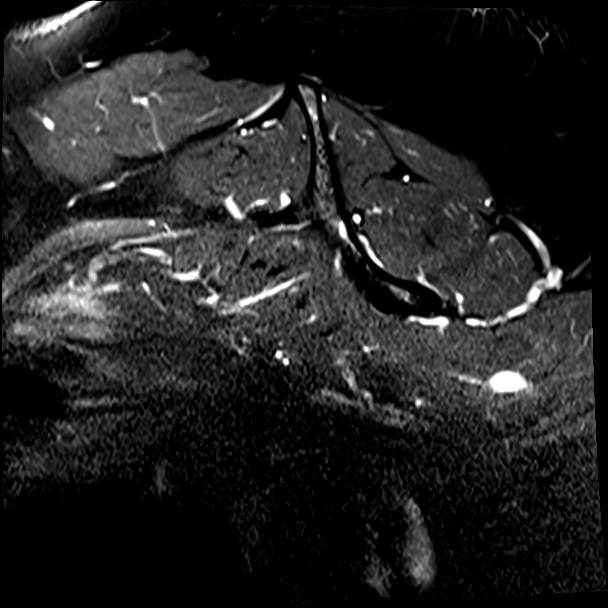
[im 4/22]
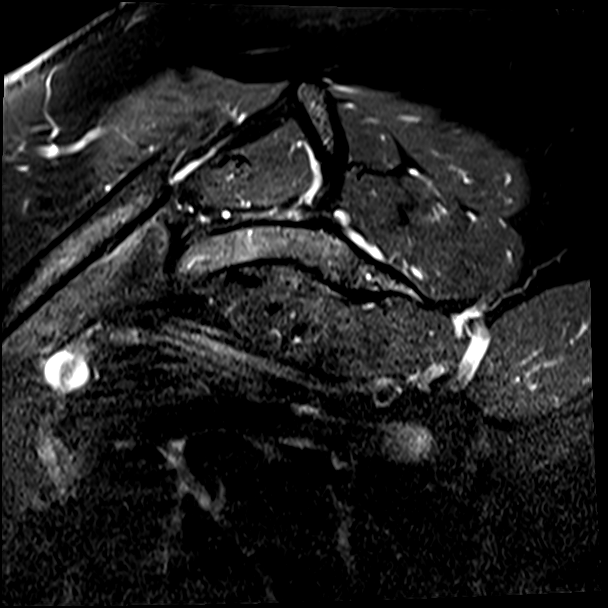
[im 8/22]
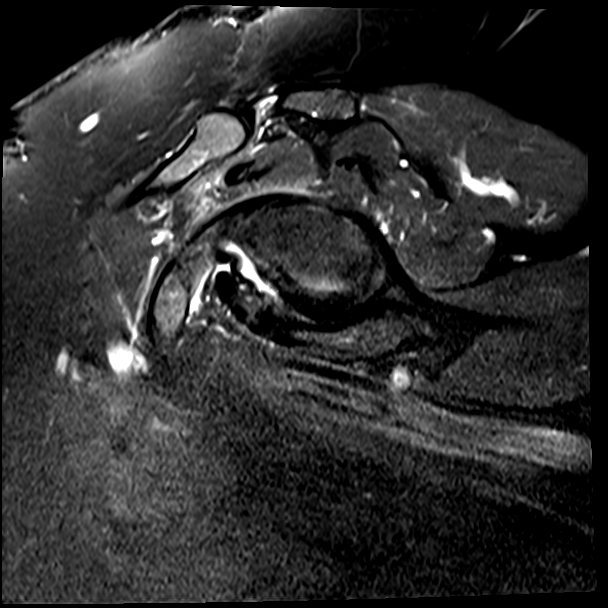
[im 11/22]
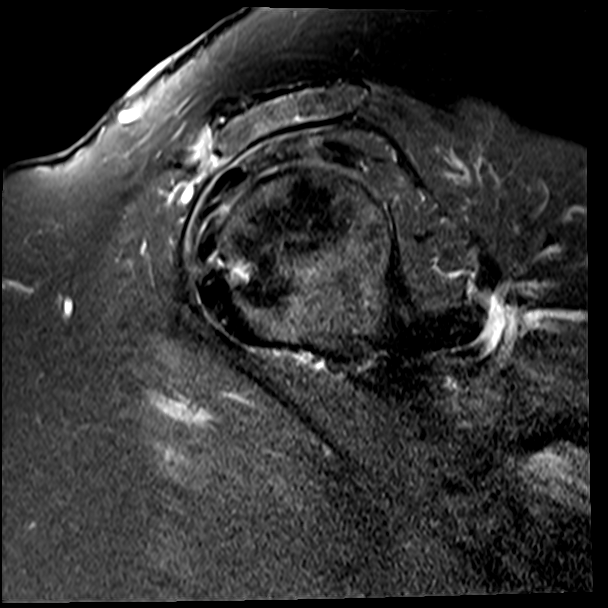
[im 15/22]
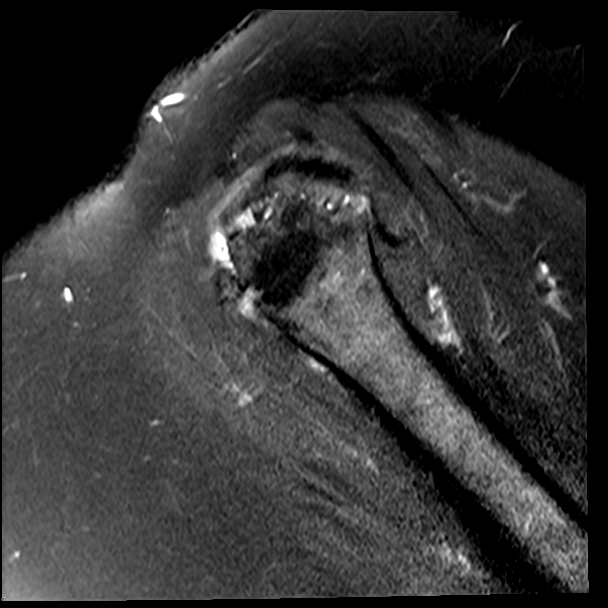
[im 18/22]
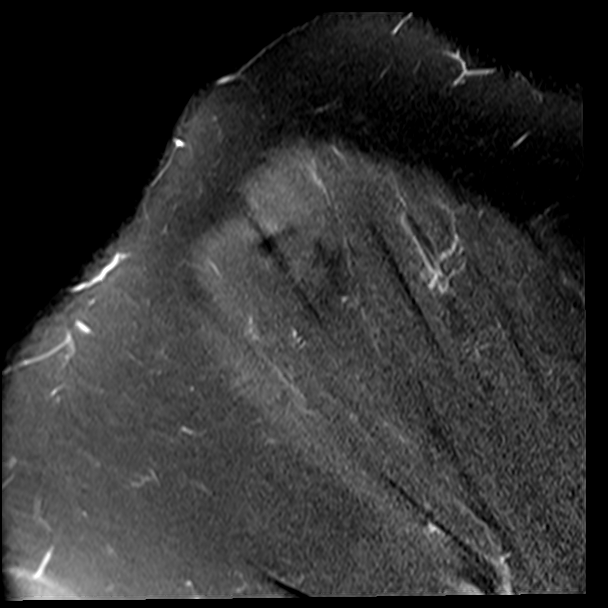
[im 22/22]
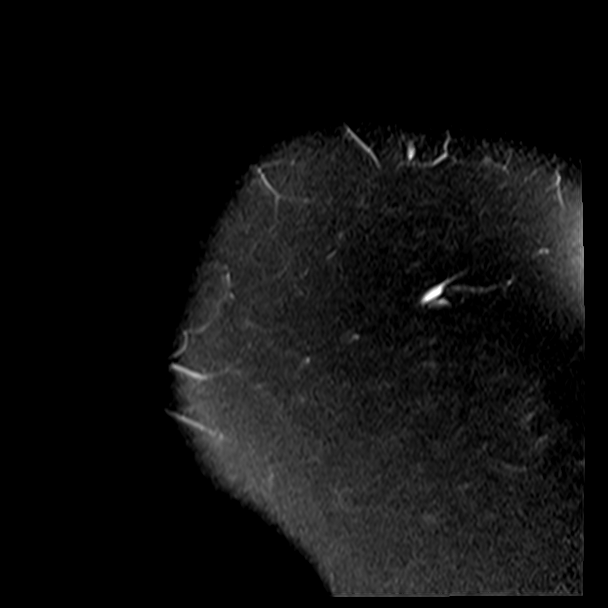

[Series 9: T1 · oblique · left · 4.0mm · 0.36mm/px · 2 of 22 slices shown]
[im 1/22]
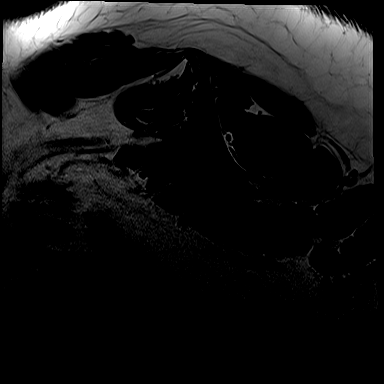
[im 4/22]
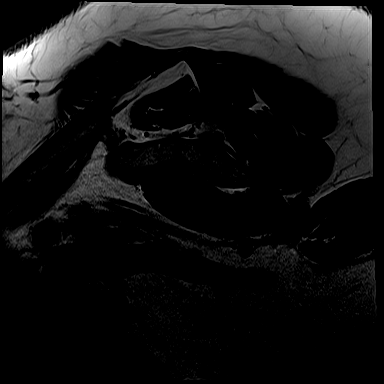

[33 of 40 positions shown; findings below may reference images not displayed]

FINDINGS: Rotator cuff: Heterogeneously increased T2 signal in the rotator
cuff tendons consistent with tendinopathy is noted. 2-3 tiny
fissures are seen in the supraspinatus but no full-thickness tear or
tendon retraction.

Muscles:  Preserved without atrophy or focal lesion.

Biceps long head:  Intact.

Acromioclavicular Joint: Moderate degenerative change is present.
Type 2 acromion. Small subacromial spur is noted.

Glenohumeral Joint: Appears normal.

Labrum:  Intact.

Bones:  No fracture or focal lesion.

Other: None.
IMPRESSION: Rotator cuff tendinopathy with 2-3 tiny fissures in the
supraspinatus but no focal tear.

Moderate acromioclavicular osteoarthritis. Small subacromial spur
also noted.

## 2021-03-11 ENCOUNTER — Other Ambulatory Visit: Payer: Self-pay | Admitting: General Surgery

## 2021-03-11 DIAGNOSIS — C50511 Malignant neoplasm of lower-outer quadrant of right female breast: Secondary | ICD-10-CM

## 2021-03-11 DIAGNOSIS — C3411 Malignant neoplasm of upper lobe, right bronchus or lung: Secondary | ICD-10-CM

## 2021-03-11 DIAGNOSIS — Z170 Estrogen receptor positive status [ER+]: Secondary | ICD-10-CM

## 2021-03-18 ENCOUNTER — Ambulatory Visit
Admission: RE | Admit: 2021-03-18 | Discharge: 2021-03-18 | Disposition: A | Discharge status: Home / Self Care | Payer: Medicare HMO | Source: Ambulatory Visit | Attending: Radiation Oncology | Admitting: Radiation Oncology

## 2021-03-18 ENCOUNTER — Other Ambulatory Visit: Payer: Self-pay

## 2021-03-18 VITALS — BP 162/77 | HR 68 | Temp 96.9°F | Ht 62.0 in | Wt 239.6 lb

## 2021-03-18 DIAGNOSIS — M797 Fibromyalgia: Secondary | ICD-10-CM | POA: Diagnosis not present

## 2021-03-18 DIAGNOSIS — M129 Arthropathy, unspecified: Secondary | ICD-10-CM | POA: Diagnosis not present

## 2021-03-18 DIAGNOSIS — Z87891 Personal history of nicotine dependence: Secondary | ICD-10-CM | POA: Insufficient documentation

## 2021-03-18 DIAGNOSIS — Z170 Estrogen receptor positive status [ER+]: Secondary | ICD-10-CM | POA: Diagnosis not present

## 2021-03-18 DIAGNOSIS — M1990 Unspecified osteoarthritis, unspecified site: Secondary | ICD-10-CM | POA: Insufficient documentation

## 2021-03-18 DIAGNOSIS — Z801 Family history of malignant neoplasm of trachea, bronchus and lung: Secondary | ICD-10-CM | POA: Insufficient documentation

## 2021-03-18 DIAGNOSIS — Z800 Family history of malignant neoplasm of digestive organs: Secondary | ICD-10-CM | POA: Diagnosis not present

## 2021-03-18 DIAGNOSIS — Z79899 Other long term (current) drug therapy: Secondary | ICD-10-CM | POA: Diagnosis not present

## 2021-03-18 DIAGNOSIS — C50511 Malignant neoplasm of lower-outer quadrant of right female breast: Secondary | ICD-10-CM | POA: Diagnosis present

## 2021-03-18 DIAGNOSIS — Z803 Family history of malignant neoplasm of breast: Secondary | ICD-10-CM | POA: Insufficient documentation

## 2021-03-18 DIAGNOSIS — Z85118 Personal history of other malignant neoplasm of bronchus and lung: Secondary | ICD-10-CM | POA: Diagnosis not present

## 2021-03-18 DIAGNOSIS — I10 Essential (primary) hypertension: Secondary | ICD-10-CM | POA: Diagnosis not present

## 2021-03-18 DIAGNOSIS — K219 Gastro-esophageal reflux disease without esophagitis: Secondary | ICD-10-CM | POA: Insufficient documentation

## 2021-03-18 DIAGNOSIS — E039 Hypothyroidism, unspecified: Secondary | ICD-10-CM | POA: Diagnosis not present

## 2021-03-18 NOTE — Consult Note (Signed)
NEW PATIENT EVALUATION  Name: Glenda Gomez  MRN: [id number]  Date:   03/18/2021     DOB: [birth year]   This 71 y.o. 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  REFERRING PHYSICIAN: Tracie Harrier, MD  CHIEF COMPLAINT:  Chief Complaint  Patient presents with   Breast Cancer    Initial consultation    DIAGNOSIS: The encounter diagnosis was Malignant neoplasm of lower-outer quadrant of right breast of female, estrogen receptor positive ([place]).   PREVIOUS INVESTIGATIONS:  Mammogram and ultrasound reviewed Pathology report reviewed Clinical notes reviewed  HPI: Patient is a 71 year old female who presented with an abnormal mammogram of the right breast showing a suspicious lesion at the 8 o'clock position.  Tumor measured approximately 7 mm in greatest dimension.  M ultrasound showed normal axilla with no evidence of adenopathy.  She underwent ultrasound-guided biopsy which was positive for invasive mammary carcinoma.  Went on to have a wide local excision for a 7 mm invasive mammary carcinoma overall grade 1 with margins clear 2 mm.  6 sentinel lymph nodes were all negative for metastatic disease.  Tumor was ER/PR positive HER2/neu not overexpressed.  Patient has a remote history of stage I lung cancer status post resection in complete remission for that.  She seen today for radiation oncology opinion.  She specifically denies breast tenderness cough or bone pain.  PLANNED TREATMENT REGIMEN: Hypofractionated whole breast radiation  PAST MEDICAL HISTORY:  has a past medical history of Adenocarcinoma ([place]), Arthritis, Chicken pox, Dyspnea, Environmental and seasonal allergies, Fibromyalgia, GERD (gastroesophageal reflux disease), Hemorrhoid, Hypersomnia, Hypertension, Hypothyroidism, Lung cancer ([place]), Malignant  neoplasm of right upper lobe of lung ([place]) (03/08/2019), Myositis ossificans, Osteoarthritis, and Thyroid disease.    PAST SURGICAL HISTORY:  Past Surgical History:  Procedure Laterality Date    bengin tongue lesion     Benign 1980's   ABDOMINAL HYSTERECTOMY  1979   BACK SURGERY     bladder tack  2011   BREAST BIOPSY Left    neg   BREAST BIOPSY Right 02/22/2021   u/s bc ribbon 8:00 path pending   BREAST LUMPECTOMY WITH SENTINEL LYMPH NODE BIOPSY Right 03/05/2021   Procedure: BREAST LUMPECTOMY WITH SENTINEL LYMPH NODE BX;  Surgeon: Robert Bellow, MD;  Location: [facility];  Service: General;  Laterality: Right;   CHOLECYSTECTOMY  2010   COLONOSCOPY  12/22/2011   COLONOSCOPY WITH PROPOFOL N/A 03/24/2017   Procedure: COLONOSCOPY WITH PROPOFOL;  Surgeon: Lollie Sails, MD;  Location: [facility] END[facility];  Service: Endoscopy;  Laterality: N/A;   COLONOSCOPY WITH PROPOFOL N/A 07/22/2020   Procedure: COLONOSCOPY WITH PROPOFOL;  Surgeon: Toledo, Benay Pike, MD;  Location: [facility] END[facility];  Service: Gastroenterology;  Laterality: N/A;   ESOPHAGOGASTRODUODENOSCOPY (EGD) WITH PROPOFOL N/A 03/24/2017   Procedure: ESOPHAGOGASTRODUODENOSCOPY (EGD) WITH PROPOFOL;  Surgeon: Lollie Sails, MD;  Location: [facility];  Service: Endoscopy;  Laterality: N/A;   ESOPHAGOGASTRODUODENOSCOPY (EGD) WITH PROPOFOL N/A 07/22/2020   Procedure: ESOPHAGOGASTRODUODENOSCOPY (EGD) WITH PROPOFOL;  Surgeon: Toledo, Benay Pike, MD;  Location: [facility] ENDOSCOPY;  Service: Gastroenterology;  Laterality: N/A;   GASTRIC BYPASS OPEN     7903'Y complicated by herniated stomach requiring reversal with mesh   HEMORRHOID SURGERY     INTERCOSTAL NERVE BLOCK Right 03/29/2019   Procedure: Intercostal Nerve Block;  Surgeon: [name], MD;  Location: [facility]  OR;  Service: Thoracic;  Laterality: Right;   JOINT REPLACEMENT Bilateral    TKR   KNEE ARTHROPLASTY Right 07/06/2016   Procedure: COMPUTER ASSISTED TOTAL KNEE  ARTHROPLASTY;  Surgeon: Dereck Leep, MD;  Location: [facility] ORS;  Service: Orthopedics;  Laterality: Right;   KNEE ARTHROSCOPY Left 10/19/2006   x 3   KNEE ARTHROSCOPY Right    x2   LOBECTOMY Right 03/29/2019   XI ROBOTIC ASSISTED THORASCOPY- RIGHT UPPER LOBECTOMY (Right Chest)   NODE DISSECTION  03/29/2019   Procedure: Node Dissection;  Surgeon: [name], MD;  Location: [place] [facility];  Service: Thoracic;;   plantar fascia Left    PLANTAR FASCIA RELEASE Right    x 3   POSTERIOR LAMINECTOMY / DECOMPRESSION LUMBAR SPINE  06/24/2015   SHOULDER ARTHROSCOPY WITH DEBRIDEMENT AND BICEP TENDON REPAIR Left 09/25/2018   Procedure: SHOULDER ARTHROSCOPY WITH DEBRIDEMENT DECOMPRESSION AND BICEP TENDON REPAIR - SCRUB TECH;  Surgeon: Corky Mull, MD;  Location: [facility];  Service: Orthopedics;  Laterality: Left;   TONSILLECTOMY     childhood   TOTAL KNEE ARTHROPLASTY Left 11/17/2004   VIDEO BRONCHOSCOPY WITH ENDOBRONCHIAL NAVIGATION N/A 03/04/2019   Procedure: VIDEO BRONCHOSCOPY WITH ENDOBRONCHIAL NAVIGATION;  Surgeon: Ottie Glazier, MD;  Location: [facility];  Service: Thoracic;  Laterality: N/A;   VIDEO BRONCHOSCOPY WITH ENDOBRONCHIAL ULTRASOUND N/A 03/04/2019   Procedure: VIDEO BRONCHOSCOPY WITH ENDOBRONCHIAL ULTRASOUND;  Surgeon: Ottie Glazier, MD;  Location: [facility];  Service: Thoracic;  Laterality: N/A;    FAMILY HISTORY: family history includes Alzheimer's disease in her mother; Arthritis in her mother; Breast cancer (age of onset: 85) in her sister; Breast cancer (age of onset: 41) in her sister; Breast cancer (age of onset: 23) in her maternal aunt; Diabetes in her mother; Heart failure in her brother; Liver cancer in her father; Lung cancer in her brother and maternal aunt; Pancreatic cancer (age of onset: 59) in her brother.  SOCIAL HISTORY:  reports that she quit smoking about 27 years ago. Her smoking use included cigarettes. She has a 20.00 pack-year smoking history. She has never  used smokeless tobacco. She reports that she does not drink alcohol and does not use drugs.  ALLERGIES: Flagyl [metronidazole], Phentermine, and Pregabalin  MEDICATIONS:  Current Outpatient Medications  Medication Sig Dispense Refill   ascorbic acid (VITAMIN C) 1000 MG tablet Take 1,000 mg by mouth daily.     celecoxib (CELEBREX) 200 MG capsule Take 1 capsule (200 mg total) by mouth 2 (two) times daily with a meal. 60 capsule 1   furosemide (LASIX) 20 MG tablet Take 20 mg by mouth daily as needed for fluid or edema.     levothyroxine (SYNTHROID) 88 MCG tablet Take 88 mcg by mouth daily before breakfast.     Multiple Vitamins-Minerals (PRESERVISION AREDS 2 PO) Take 1 capsule by mouth in the morning and at bedtime.     omeprazole (PRILOSEC) 20 MG capsule Take 20 mg by mouth 2 (two) times daily before a meal.      Semaglutide (OZEMPIC, 0.25 OR 0.5 MG/DOSE, [place]) Inject 0.25 mg into the skin every Sunday.     triamterene-hydrochlorothiazide (MAXZIDE-25) 37.5-25 MG tablet Take 1 tablet by mouth daily.     Vitamin D, Ergocalciferol, (DRISDOL) 1.25 MG (50000 UNIT) CAPS capsule Take 50,000 Units by mouth once a week. Friday     zolpidem (AMBIEN) 5 MG tablet Take 5-7.5 mg by mouth at bedtime.     HYDROcodone-acetaminophen (NORCO/VICODIN) 5-325 MG tablet Take 1 tablet  by mouth every 4 (four) hours as needed for moderate pain. (Patient not taking: Reported on 03/18/2021) 12 tablet 0   No current facility-administered medications for this encounter.    ECOG PERFORMANCE STATUS:  0 - Asymptomatic  REVIEW OF SYSTEMS: Patient denies any weight loss, fatigue, weakness, fever, chills or night sweats. Patient denies any loss of vision, blurred vision. Patient denies any ringing  of the ears or hearing loss. No irregular heartbeat. Patient denies heart murmur or history of fainting. Patient denies any chest pain or pain radiating to her upper extremities. Patient denies any shortness of breath, difficulty breathing  at night, cough or hemoptysis. Patient denies any swelling in the lower legs. Patient denies any nausea vomiting, vomiting of blood, or coffee ground material in the vomitus. Patient denies any stomach pain. Patient states has had normal bowel movements no significant constipation or diarrhea. Patient denies any dysuria, hematuria or significant nocturia. Patient denies any problems walking, swelling in the joints or loss of balance. Patient denies any skin changes, loss of hair or loss of weight. Patient denies any excessive worrying or anxiety or significant depression. Patient denies any problems with insomnia. Patient denies excessive thirst, polyuria, polydipsia. Patient denies any swollen glands, patient denies easy bruising or easy bleeding. Patient denies any recent infections, allergies or URI. Patient "s visual fields have not changed significantly in recent time.   PHYSICAL EXAM: BP (!) 162/77 (BP Location: Left Arm, Patient Position: Sitting)    Pulse 68    Temp (!) 96.9 F (36.1 C) (Tympanic)    Ht $R'5\' 2"'Gt$  (1.575 m)    Wt 239 lb 9.6 oz (108.7 kg)    BMI 43.82 kg/m  Right breast is wide local excision scar which is healing well no dominant masses noted in either breast.  No axillary or supraclavicular adenopathy is appreciated.  Well-developed well-nourished patient in NAD. HEENT reveals PERLA, EOMI, discs not visualized.  Oral cavity is clear. No oral mucosal lesions are identified. Neck is clear without evidence of cervical or supraclavicular adenopathy. Lungs are clear to A&P. Cardiac examination is essentially unremarkable with regular rate and rhythm without murmur rub or thrill. Abdomen is benign with no organomegaly or masses noted. Motor sensory and DTR levels are equal and symmetric in the upper and lower extremities. Cranial nerves II through XII are grossly intact. Proprioception is intact. No peripheral adenopathy or edema is identified. No motor or sensory levels are noted. Crude visual  fields are within normal range.  LABORATORY DATA: Pathology report reviewed    RADIOLOGY RESULTS: Mammogram and ultrasound reviewed compatible with above-stated findings   IMPRESSION: Stage Ia invasive mammary carcinoma of the right breast status post wide local excision and sentinel node biopsy ER/PR positive in 71 year old female  PLAN: At this time I have recommended a course of hypofractionated whole breast radiation over 3 weeks.  Would also boost her scar another 1000 cGy using electron.  Risks and benefits of treatment including skin reaction fatigue alteration of blood counts possible inclusion of superficial lung all were reviewed in detail with the patient.  She seems to comprehend our treatment plan well.  I have personally set up and ordered CT simulation for early next week.  Patient also will benefit from antiestrogen therapy after completion of radiation.  She has been evaluated by medical oncology not thought to be a candidate for systemic chemotherapy.  I would like to take this opportunity to thank you for allowing me to participate in the care  of your patient.Noreene Filbert, MD

## 2021-03-19 ENCOUNTER — Encounter: Payer: Self-pay | Admitting: Internal Medicine

## 2021-03-19 ENCOUNTER — Inpatient Hospital Stay: Payer: Medicare HMO | Attending: Internal Medicine | Admitting: Internal Medicine

## 2021-03-19 DIAGNOSIS — E669 Obesity, unspecified: Secondary | ICD-10-CM | POA: Insufficient documentation

## 2021-03-19 DIAGNOSIS — E876 Hypokalemia: Secondary | ICD-10-CM | POA: Insufficient documentation

## 2021-03-19 DIAGNOSIS — C3411 Malignant neoplasm of upper lobe, right bronchus or lung: Secondary | ICD-10-CM | POA: Insufficient documentation

## 2021-03-19 NOTE — Progress Notes (Signed)
[facility] NOTE  Patient Care Team: Tracie Harrier, MD as PCP - General (Internal Medicine) Telford Nab, RN as Oncology Nurse Navigator [name], MD as Consulting Physician (Cardiothoracic Surgery) Cammie Sickle, MD as Consulting Physician (Internal Medicine) Nestor Lewandowsky, MD (Inactive) as Referring Physician (Cardiothoracic Surgery) Theodore Demark, RN as Oncology Nurse Navigator  CHIEF COMPLAINTS/PURPOSE OF CONSULTATION: lung cancer  #  Oncology History Overview Note  # JAN 2021- ADENOCA [Dr.A; [place] 2021-]DEC 2020- 13 mm lobular somewhat branching right upper lobe lesion is not hypermetabolic.Number of Lymph Nodes Examined: 17 ; Pathologic Stage Classification (pTNM, AJCC 8th Edition): pT1c, pN0 [Dr.Henderickson]; STAGE I-adenocarcinoma; no adjuvant therapy  # Hx of barrets [[facility] -GI]  # Hx of garstic bypass [1970s]  SPECIMEN  Procedure: Lumpectomy  Specimen Laterality: Right   TUMOR  Histologic Type: Invasive mammary carcinoma, no special type (ductal)  Histologic Grade (Nottingham Histologic Score)                       Glandular (Acinar)/Tubular Differentiation: 1                       Nuclear Pleomorphism: 2                       Mitotic Rate: 1                       Overall Grade: 1  Tumor Size: 7 x 7 x 5 mm  Tumor Focality: Unifocal  Tumor Extent: Not applicable  Ductal Carcinoma In Situ (DCIS): Not identified  Lymphatic and/or Vascular Invasion: Not identified  Treatment Effect in the Breast: No known presurgical treatment         Malignant neoplasm of right upper lobe of lung ([place])  03/08/2019 Initial Diagnosis   Malignant neoplasm of right upper lobe of lung ([place])   04/03/2019 Cancer Staging   Staging form: Lung, AJCC 8th Edition - Clinical stage from 04/03/2019: ycT1, cN0, cM0 - Signed by [name], MD on 04/03/2019     HISTORY OF PRESENTING ILLNESS: Ambulating independently.  Alone. Glenda C  Gomez 71 y.o.  female with right upper lobe stage I lung cancer ; and early stage breast cancer is here s/p lumpectomy.  Patient in the interim evaluated by radiation oncology.  She is concerned about the side effects of radiation.  State that she is healing well from surgery.  No postoperative complications  noted.   Review of Systems  Constitutional:  Positive for malaise/fatigue. Negative for chills, diaphoresis, fever and weight loss.  HENT:  Negative for nosebleeds and sore throat.   Eyes:  Negative for double vision.  Respiratory:  Negative for hemoptysis, sputum production, shortness of breath and wheezing.   Cardiovascular:  Negative for chest pain, palpitations, orthopnea and leg swelling.  Gastrointestinal:  Negative for abdominal pain, blood in stool, constipation, diarrhea, heartburn, melena, nausea and vomiting.  Genitourinary:  Negative for dysuria, frequency and urgency.  Musculoskeletal:  Positive for back pain and joint pain.  Skin: Negative.  Negative for itching and rash.  Neurological:  Negative for tingling and focal weakness.  Endo/Heme/Allergies:  Does not bruise/bleed easily.  Psychiatric/Behavioral:  Negative for depression. The patient is not nervous/anxious and does not have insomnia.     MEDICAL HISTORY:  Past Medical History:  Diagnosis Date   Adenocarcinoma ([place])    right upper lobe  Arthritis    Chicken pox    Dyspnea    Environmental and seasonal allergies    Fibromyalgia    GERD (gastroesophageal reflux disease)    Hemorrhoid    Hypersomnia    Hypertension    Hypothyroidism    Lung cancer ([place])    Malignant neoplasm of right upper lobe of lung ([place]) 03/08/2019   Myositis ossificans    Osteoarthritis    Thyroid disease     SURGICAL HISTORY: Past Surgical History:  Procedure Laterality Date    bengin tongue lesion     Benign 1980's   ABDOMINAL HYSTERECTOMY  1979   BACK SURGERY     bladder tack  2011   BREAST BIOPSY Left    neg    BREAST BIOPSY Right 02/22/2021   u/s bc ribbon 8:00 path pending   BREAST LUMPECTOMY WITH SENTINEL LYMPH NODE BIOPSY Right 03/05/2021   Procedure: BREAST LUMPECTOMY WITH SENTINEL LYMPH NODE BX;  Surgeon: Robert Bellow, MD;  Location: [facility];  Service: General;  Laterality: Right;   CHOLECYSTECTOMY  2010   COLONOSCOPY  12/22/2011   COLONOSCOPY WITH PROPOFOL N/A 03/24/2017   Procedure: COLONOSCOPY WITH PROPOFOL;  Surgeon: Lollie Sails, MD;  Location: [facility] END[facility];  Service: Endoscopy;  Laterality: N/A;   COLONOSCOPY WITH PROPOFOL N/A 07/22/2020   Procedure: COLONOSCOPY WITH PROPOFOL;  Surgeon: Toledo, Benay Pike, MD;  Location: [facility] END[facility];  Service: Gastroenterology;  Laterality: N/A;   ESOPHAGOGASTRODUODENOSCOPY (EGD) WITH PROPOFOL N/A 03/24/2017   Procedure: ESOPHAGOGASTRODUODENOSCOPY (EGD) WITH PROPOFOL;  Surgeon: Lollie Sails, MD;  [facility];  Service: Endoscopy;  Laterality: N/A;   ESOPHAGOGASTRODUODENOSCOPY (EGD) WITH PROPOFOL N/A 07/22/2020   Procedure: ESOPHAGOGASTRODUODENOSCOPY (EGD) WITH PROPOFOL;  Surgeon: Toledo, Benay Pike, MD;  Location: [facility] END[facility];  Service: Gastroenterology;  Laterality: N/A;   GASTRIC BYPASS OPEN     6389'H complicated by herniated stomach requiring reversal with mesh   HEMORRHOID SURGERY     INTERCOSTAL NERVE BLOCK Right 03/29/2019   Procedure: Intercostal Nerve Block;  Surgeon: [name], MD;  Location: [facility];  Service: Thoracic;  Laterality: Right;   JOINT REPLACEMENT Bilateral    TKR   KNEE ARTHROPLASTY Right 07/06/2016   Procedure: COMPUTER ASSISTED TOTAL KNEE ARTHROPLASTY;  Surgeon: Dereck Leep, MD;  Location: [facility];  Service: Orthopedics;  Laterality: Right;   KNEE ARTHROSCOPY Left 10/19/2006   x 3   KNEE ARTHROSCOPY Right    x2   LOBECTOMY Right 03/29/2019   XI ROBOTIC ASSISTED THORASCOPY- RIGHT UPPER LOBECTOMY (Right Chest)   NODE DISSECTION  03/29/2019   Procedure: Node Dissection;   Surgeon: [name], MD;  Location: [facility];  Service: Thoracic;;   plantar fascia Left    PLANTAR FASCIA RELEASE Right    x 3   POSTERIOR LAMINECTOMY / DECOMPRESSION LUMBAR SPINE  06/24/2015   SHOULDER ARTHROSCOPY WITH DEBRIDEMENT AND BICEP TENDON REPAIR Left 09/25/2018   Procedure: SHOULDER ARTHROSCOPY WITH DEBRIDEMENT DECOMPRESSION AND BICEP TENDON REPAIR - SCRUB TECH;  Surgeon: Corky Mull, MD;  Location: [facility];  Service: Orthopedics;  Laterality: Left;   TONSILLECTOMY     childhood   TOTAL KNEE ARTHROPLASTY Left 11/17/2004   VIDEO BRONCHOSCOPY WITH ENDOBRONCHIAL NAVIGATION N/A 03/04/2019   Procedure: VIDEO BRONCHOSCOPY WITH ENDOBRONCHIAL NAVIGATION;  Surgeon: Ottie Glazier, MD;  Location: [facility];  Service: Thoracic;  Laterality: N/A;   VIDEO BRONCHOSCOPY WITH ENDOBRONCHIAL ULTRASOUND N/A 03/04/2019   Procedure: VIDEO BRONCHOSCOPY WITH ENDOBRONCHIAL ULTRASOUND;  Surgeon: Ottie Glazier,  MD;  Location: [facility];  Service: Thoracic;  Laterality: N/A;    SOCIAL HISTORY: Social History   Socioeconomic History   Marital status: Divorced    Spouse name: Not on file   Number of children: Not on file   Years of education: Not on file   Highest education level: Not on file  Occupational History   Not on file  Tobacco Use   Smoking status: Former    Packs/day: 2.00    Years: 10.00    Pack years: 20.00    Types: Cigarettes    Quit date: 07/17/1993    Years since quitting: 27.6   Smokeless tobacco: Never  Vaping Use   Vaping Use: Never used  Substance and Sexual Activity   Alcohol use: No   Drug use: No   Sexual activity: Not on file  Other Topics Concern   Not on file  Social History Narrative   1ppd- quit > 20 years ago; no alcohol; in [place]; lives self. retd- cook.    Social Determinants of Health   Financial Resource Strain: Not on file  Food Insecurity: Not on file  Transportation Needs: Not on file  Physical Activity: Not on file  Stress: Not on  file  Social Connections: Not on file  Intimate Partner Violence: Not on file    FAMILY HISTORY: Family History  Problem Relation Age of Onset   Breast cancer Sister 38   Breast cancer Maternal Aunt 1   Lung cancer Maternal Aunt    Breast cancer Sister 72   Alzheimer's disease Mother    Arthritis Mother    Diabetes Mother    Heart failure Brother    Pancreatic cancer Brother 67   Lung cancer Brother    Liver cancer Father     ALLERGIES:  is allergic to flagyl [metronidazole], phentermine, and pregabalin.  MEDICATIONS:  Current Outpatient Medications  Medication Sig Dispense Refill   ascorbic acid (VITAMIN C) 1000 MG tablet Take 1,000 mg by mouth daily.     celecoxib (CELEBREX) 200 MG capsule Take 1 capsule (200 mg total) by mouth 2 (two) times daily with a meal. 60 capsule 1   levothyroxine (SYNTHROID) 88 MCG tablet Take 88 mcg by mouth daily before breakfast.     omeprazole (PRILOSEC) 20 MG capsule Take 20 mg by mouth 2 (two) times daily before a meal.      Semaglutide (OZEMPIC, 0.25 OR 0.5 MG/DOSE, [place]) Inject 0.25 mg into the skin every Sunday.     triamterene-hydrochlorothiazide (MAXZIDE-25) 37.5-25 MG tablet Take 1 tablet by mouth daily.     Vitamin D, Ergocalciferol, (DRISDOL) 1.25 MG (50000 UNIT) CAPS capsule Take 50,000 Units by mouth once a week. Friday     zolpidem (AMBIEN) 5 MG tablet Take 5-7.5 mg by mouth at bedtime.     furosemide (LASIX) 20 MG tablet Take 20 mg by mouth daily as needed for fluid or edema. (Patient not taking: Reported on 03/19/2021)     HYDROcodone-acetaminophen (NORCO/VICODIN) 5-325 MG tablet Take 1 tablet by mouth every 4 (four) hours as needed for moderate pain. (Patient not taking: Reported on 03/18/2021) 12 tablet 0   Multiple Vitamins-Minerals (PRESERVISION AREDS 2 PO) Take 1 capsule by mouth in the morning and at bedtime. (Patient not taking: Reported on 03/19/2021)     No current facility-administered medications for this visit.       Marland Kitchen  PHYSICAL EXAMINATION: ECOG PERFORMANCE STATUS: 0 - Asymptomatic  Vitals:   03/19/21 1434  BP: Marland Kitchen)  152/81  Pulse: 75  Resp: 20  Temp: 97.8 F (36.6 C)  SpO2: 100%   Filed Weights   03/19/21 1434  Weight: 242 lb 3.2 oz (109.9 kg)    Physical Exam Constitutional:      Comments: Obese. She is walking herself.  HENT:     Head: Normocephalic and atraumatic.     Mouth/Throat:     Pharynx: No oropharyngeal exudate.  Eyes:     Pupils: Pupils are equal, round, and reactive to light.  Cardiovascular:     Rate and Rhythm: Normal rate and regular rhythm.  Pulmonary:     Effort: Pulmonary effort is normal. No respiratory distress.     Breath sounds: Normal breath sounds. No wheezing.  Abdominal:     General: Bowel sounds are normal. There is no distension.     Palpations: Abdomen is soft. There is no mass.     Tenderness: There is no abdominal tenderness. There is no guarding or rebound.  Musculoskeletal:        General: No tenderness. Normal range of motion.     Cervical back: Normal range of motion and neck supple.  Skin:    General: Skin is warm.  Neurological:     Mental Status: She is alert and oriented to person, place, and time.  Psychiatric:        Mood and Affect: Affect normal.   LABORATORY DATA:  I have reviewed the data as listed Lab Results  Component Value Date   WBC 9.2 02/01/2021   HGB 14.2 02/01/2021   HCT 45.3 02/01/2021   MCV 82.7 02/01/2021   PLT 319 02/01/2021   Recent Labs    07/03/20 1056 07/31/20 0937 02/01/21 1432 03/04/21 1100  NA  --  137 136  --   K  --  3.8 3.3* 3.2*  CL  --  103 99  --   CO2  --  26 28  --   GLUCOSE  --  87 110*  --   BUN  --  12 17  --   CREATININE 0.90 0.82 0.85  --   CALCIUM  --  9.2 8.9  --   GFRNONAA  --  >60 >60  --   PROT  --  7.5 7.3  --   ALBUMIN  --  3.9 3.9  --   AST  --  25 24  --   ALT  --  15 13  --   ALKPHOS  --  70 73  --   BILITOT  --  0.6 0.5  --     RADIOGRAPHIC STUDIES: I have  personally reviewed the radiological images as listed and agreed with the findings in the report. NM Sentinel Node Inj-No Rpt (Breast)  Result Date: 03/05/2021 Sulfur Colloid was injected by the Nuclear Medicine Technologist for sentinel lymph node localization.   MM Breast Surgical Specimen  Result Date: 03/08/2021 CLINICAL DATA:  Status post ultrasound-guided biopsy on February 22, 2021 which demonstrated invasive mammary carcinoma. RIBBON clip. EXAM: SPECIMEN RADIOGRAPH OF THE RIGHT BREAST. COMPARISON:  Previous exam(s). FINDINGS: Status post excision of the RIGHT breast. The RIBBON shaped biopsy marker clip is present in the specimen. IMPRESSION: Specimen radiograph of the RIGHT breast. Electronically Signed   By: Valentino Saxon M.D.   On: 03/08/2021 16:23  MM CLIP PLACEMENT RIGHT  Result Date: 02/22/2021 CLINICAL DATA:  Patient is post ultrasound-guided core needle biopsy of a 7 mm spiculated mass over the 8 o'clock position of  the right breast. EXAM: 3D DIAGNOSTIC RIGHT MAMMOGRAM POST ULTRASOUND BIOPSY COMPARISON:  Previous exam(s). FINDINGS: 3D Mammographic images were obtained following ultrasound guided biopsy of the targeted mass at the 8 o'clock position. The biopsy marking clip is in expected position at the site of biopsy. IMPRESSION: Appropriate positioning of the ribbon shaped biopsy marking clip at the site of biopsy in the outer lower right breast. Final Assessment: Post Procedure Mammograms for Marker Placement Electronically Signed   By: Marin Olp M.D.   On: 02/22/2021 08:47  [place] RT BREAST BX W LOC DEV 1ST LESION IMG BX SPEC US GUIDE  Addendum Date: 02/23/2021   ADDENDUM REPORT: 02/23/2021 14:09 ADDENDUM: PATHOLOGY revealed: A. RIGHT BREAST, 8:00 o'clock, 8 CMFN; ULTRASOUND-GUIDED BIOPSY: - INVASIVE MAMMARY CARCINOMA, NO SPECIAL TYPE. Size of invasive carcinoma: 7 mm in this sample. Grade 1. Ductal carcinoma in situ: Present, intermediate grade. Lymphovascular invasion: Not  identified. Comment: The definitive grade will be assigned on the excisional specimen. Pathology results are CONCORDANT with imaging findings, per Dr. Marin Olp. Pathology results and recommendations were discussed with patient via telephone on 02/23/2021. Patient reported biopsy site doing well with no adverse symptoms, and only slight tenderness at the site. Post biopsy care instructions were reviewed, questions were answered and my direct phone number was provided. Patient was instructed to call [facility] for any additional questions or concerns related to biopsy site. RECOMMENDATION: Surgical consultation. Patient requested surgical consultation with Dr. Ollen Bowl, and oncology consultation with Dr. Charlaine Dalton. Patient's request was relayed to Al Pimple RN and Tanya Nones RN at [facility] by Electa Sniff RN on 02/23/2021. Pathology results reported by Electa Sniff RN on 02/23/2021. Electronically Signed   By: Marin Olp M.D.   On: 02/23/2021 14:09   Result Date: 02/23/2021 CLINICAL DATA:  Since for ultrasound-guided core needle biopsy of an 7 mm spiculated mass over the 8 o'clock position of the right breast 8 cm from the nipple. EXAM: ULTRASOUND GUIDED RIGHT BREAST CORE NEEDLE BIOPSY COMPARISON:  Previous exam(s). PROCEDURE: I met with the patient and we discussed the procedure of ultrasound-guided biopsy, including benefits and alternatives. We discussed the high likelihood of a successful procedure. We discussed the risks of the procedure, including infection, bleeding, tissue injury, clip migration, and inadequate sampling. Informed written consent was given. The usual time-out protocol was performed immediately prior to the procedure. Lesion quadrant: Right outer lower quadrant. Using sterile technique and 1% Lidocaine as local anesthetic, under direct ultrasound visualization, a 12 gauge spring-loaded device was used to perform biopsy of the targeted  mass at the 8 o'clock position using a appear to superior approach. At the conclusion of the procedure a ribbon shaped tissue marker clip was deployed into the biopsy cavity. Follow up 2 view mammogram was performed and dictated separately. IMPRESSION: Ultrasound guided biopsy of a suspicious 7 mm mass over the 8 o'clock position of the right breast. No apparent complications. Electronically Signed: By: Marin Olp M.D. On: 02/22/2021 08:29     ASSESSMENT & PLAN:   Malignant neoplasm of right upper lobe of lung ([facility]) #Right breast invasive mammary carcinoma -PT1BN0 ER/PR positive HER2 negative- INVASIVE MAMMARY CARCINOMA, NO SPECIAL TYPE; grade 1; given low risk-okay to hold off Oncotype.  Would not recommend adjuvant chemotherapy.  #Patient had multiple questions regarding adjuvant radiation/toxicity; answered to the best of my knowledge.  Patient in agreement to proceed with radiation as planned.   # Discussed the mechanism of action of  aromatase inhibitors-with blocking of estrogen to prevent breast cancer.  Also discussed the potential side effects including but not limited to arthralgias hot flashes and increased risk of osteoporosis.  Adjuvant AI to start after finishing radiation.  Off estrogen replacement.   #  RIGHT UPPER LOBE LUNG CA-stage I-status post resection clear margins-DEC 2022-stable exam no evidence of any progressive disease/recurrent disease.  Reasonable to switch over to annual imaging.  Again reviewed that lung cancer is not related to new diagnosis of breast cancer.  # Hx of gastric Bypass-no anemia- STABLE.   # mild hypokalemia: K 3.3-likely secondary to triamterene hydrochlorothiazide.  Recommend increased dietary intake of potassium rich foods.    # Barrets [EGD; Dr.Toledo- June 2022]  On PP1 EGD q 3 years-stable.  Discussed the natural history of Barrett's esophagus.  # Obesity: on Ozmepic- lost weight 25 pounds.   # DISPOSITION: # follow up in 2 months-  MD;NO  labs-Dr..B       All questions were answered. The patient knows to call the clinic with any problems, questions or concerns.    Cammie Sickle, MD 03/19/2021 5:10 PM

## 2021-03-19 NOTE — Progress Notes (Signed)
Patient states she wants to discuss the radiation because she doesn't know what to do.

## 2021-03-19 NOTE — Assessment & Plan Note (Addendum)
#  Right breast invasive mammary carcinoma -PT1BN0 ER/PR positive HER2 negative- INVASIVE MAMMARY CARCINOMA, NO SPECIAL TYPE; grade 1; given low risk-okay to hold off Oncotype.  Would not recommend adjuvant chemotherapy.  #Patient had multiple questions regarding adjuvant radiation/toxicity; answered to the best of my knowledge.  Patient in agreement to proceed with radiation as planned.   # Discussed the mechanism of action of aromatase inhibitors-with blocking of estrogen to prevent breast cancer.  Also discussed the potential side effects including but not limited to arthralgias hot flashes and increased risk of osteoporosis.  Adjuvant AI to start after finishing radiation.  Off estrogen replacement.   #  RIGHT UPPER LOBE LUNG CA-stage I-status post resection clear margins-DEC 2022-stable exam no evidence of any progressive disease/recurrent disease.  Reasonable to switch over to annual imaging.  Again reviewed that lung cancer is not related to new diagnosis of breast cancer.  # Hx of gastric Bypass-no anemia- STABLE.   # mild hypokalemia: K 3.3-likely secondary to triamterene hydrochlorothiazide.  Recommend increased dietary intake of potassium rich foods.  # Barrets [EGD; Dr.Toledo- June 2022]  On PP1 EGD q 3 years-stable.  Discussed the natural history of Barrett's esophagus.  # Obesity: on Ozmepic- lost weight 25 pounds.   # DISPOSITION: # follow up in 2 months-  MD;NO labs-Dr..B

## 2021-03-22 ENCOUNTER — Ambulatory Visit: Payer: Medicare HMO

## 2021-03-23 ENCOUNTER — Telehealth: Payer: Self-pay | Admitting: *Deleted

## 2021-03-23 ENCOUNTER — Other Ambulatory Visit: Payer: Self-pay | Admitting: Internal Medicine

## 2021-03-23 MED ORDER — ANASTROZOLE 1 MG PO TABS: 1.0000 mg | ORAL_TABLET | Freq: Every day | ORAL | 1 refills | Status: DC

## 2021-03-23 NOTE — Telephone Encounter (Signed)
Please schedule patient to see MD in a few weeks to discuss start of medication.

## 2021-03-23 NOTE — Progress Notes (Signed)
Patient declined radiation; wants to start aromatase inhibitor.  I sent a prescription for anastrozole.  Patient will keep her appointment for follow-up in 2 months as planned.  HS-we will inform patient.   GB

## 2021-03-23 NOTE — Progress Notes (Signed)
Message left on voicemail and also sent MyChart message.

## 2021-03-23 NOTE — Telephone Encounter (Addendum)
Dr. B sent a another message with response (see progress note by MD from today 03/23/21) for f/u documentation.

## 2021-03-23 NOTE — Telephone Encounter (Signed)
Ms. Abdelaziz called and has decided to not pursue radiation.  She was asking about her next steps and getting her AI started.  I told her I would pass this information along and someone from Dr. Sharmaine Base team would be in contact with what she needs to do next.

## 2021-03-23 NOTE — Progress Notes (Signed)
Spoke to patient and she is informed of MD recommendation.

## 2021-03-29 ENCOUNTER — Ambulatory Visit: Payer: Medicare HMO

## 2021-03-30 ENCOUNTER — Ambulatory Visit: Payer: Medicare HMO

## 2021-03-31 ENCOUNTER — Ambulatory Visit: Payer: Medicare HMO

## 2021-04-01 ENCOUNTER — Ambulatory Visit: Payer: Medicare HMO

## 2021-04-02 ENCOUNTER — Ambulatory Visit: Payer: Medicare HMO

## 2021-04-05 ENCOUNTER — Ambulatory Visit: Payer: Medicare HMO

## 2021-04-06 ENCOUNTER — Ambulatory Visit: Payer: Medicare HMO

## 2021-04-07 ENCOUNTER — Ambulatory Visit: Payer: Medicare HMO

## 2021-04-08 ENCOUNTER — Ambulatory Visit: Payer: Medicare HMO

## 2021-04-09 ENCOUNTER — Ambulatory Visit: Payer: Medicare HMO

## 2021-04-12 ENCOUNTER — Ambulatory Visit: Payer: Medicare HMO

## 2021-04-13 ENCOUNTER — Ambulatory Visit: Payer: Medicare HMO

## 2021-04-14 ENCOUNTER — Ambulatory Visit: Payer: Medicare HMO

## 2021-04-15 ENCOUNTER — Ambulatory Visit: Payer: Medicare HMO

## 2021-04-16 ENCOUNTER — Ambulatory Visit: Payer: Medicare HMO

## 2021-04-19 ENCOUNTER — Ambulatory Visit: Payer: Medicare HMO

## 2021-04-20 ENCOUNTER — Ambulatory Visit: Payer: Medicare HMO

## 2021-04-21 ENCOUNTER — Ambulatory Visit: Payer: Medicare HMO

## 2021-04-22 ENCOUNTER — Ambulatory Visit: Payer: Medicare HMO

## 2021-04-23 ENCOUNTER — Ambulatory Visit: Payer: Medicare HMO

## 2021-04-26 ENCOUNTER — Ambulatory Visit: Payer: Medicare HMO

## 2021-04-27 ENCOUNTER — Ambulatory Visit: Payer: Medicare HMO

## 2021-05-25 ENCOUNTER — Encounter: Payer: Self-pay | Admitting: Internal Medicine

## 2021-05-25 ENCOUNTER — Inpatient Hospital Stay: Payer: Medicare HMO | Attending: Internal Medicine | Admitting: Internal Medicine

## 2021-05-25 DIAGNOSIS — Z79811 Long term (current) use of aromatase inhibitors: Secondary | ICD-10-CM | POA: Insufficient documentation

## 2021-05-25 DIAGNOSIS — C3411 Malignant neoplasm of upper lobe, right bronchus or lung: Secondary | ICD-10-CM | POA: Diagnosis not present

## 2021-05-25 DIAGNOSIS — C50811 Malignant neoplasm of overlapping sites of right female breast: Secondary | ICD-10-CM | POA: Diagnosis present

## 2021-05-25 DIAGNOSIS — Z85118 Personal history of other malignant neoplasm of bronchus and lung: Secondary | ICD-10-CM | POA: Diagnosis not present

## 2021-05-25 DIAGNOSIS — Z170 Estrogen receptor positive status [ER+]: Secondary | ICD-10-CM | POA: Insufficient documentation

## 2021-05-25 MED ORDER — ANASTROZOLE 1 MG PO TABS: 1.0000 mg | ORAL_TABLET | Freq: Every day | ORAL | 1 refills | Status: DC

## 2021-05-25 NOTE — Progress Notes (Signed)
[facility] ?CONSULT NOTE ? ?Patient Care Team: ?Tracie Harrier, MD as PCP - General (Internal Medicine) ?Telford Nab, RN as Sales executive ?[name], MD as Consulting Physician (Cardiothoracic Surgery) ?Cammie Sickle, MD as Consulting Physician (Internal Medicine) ?Nestor Lewandowsky, MD (Inactive) as Referring Physician (Cardiothoracic Surgery) ?Theodore Demark, RN as Oncology Nurse Navigator ? ?CHIEF COMPLAINTS/PURPOSE OF CONSULTATION: lung cancer ? ?#  ?Oncology History Overview Note  ?# JAN 2021- ADENOCA [Dr.A; [place] 2021-]DEC 2020- 13 mm lobular somewhat branching right upper lobe lesion is not hypermetabolic.Number of Lymph Nodes Examined: 17 ; Pathologic Stage Classification (pTNM, AJCC 8th Edition): pT1c, pN0 [Dr.Henderickson]; STAGE I-adenocarcinoma; no adjuvant therapy ? ?# Hx of barrets [[facility] -[facility]] ? ?# Hx of garstic bypass [1970s] ? ?SPECIMEN  ?Procedure: Lumpectomy  ?Specimen Laterality: Right  ? ?TUMOR  ?Histologic Type: Invasive mammary carcinoma, no special type (ductal)  ?Histologic Grade (Nottingham Histologic Score)  ?                     Glandular (Acinar)/Tubular Differentiation: 1  ?                     Nuclear Pleomorphism: 2  ?                     Mitotic Rate: 1  ?                     Overall Grade: 1  ?Tumor Size: 7 x 7 x 5 mm  ?Tumor Focality: Unifocal  ?Tumor Extent: Not applicable  ?Ductal Carcinoma In Situ (DCIS): Not identified  ?Lymphatic and/or Vascular Invasion: Not identified  ?Treatment Effect in the Breast: No known presurgical treatment  ? ? ? ? ? ?  ?Malignant neoplasm of right upper lobe of lung ([place])  ?03/08/2019 Initial Diagnosis  ? Malignant neoplasm of right upper lobe of lung ([place]) ?  ?04/03/2019 Cancer Staging  ? Staging form: Lung, AJCC 8th Edition ?- Clinical stage from 04/03/2019: ycT1, cN0, cM0 - Signed by [name], MD on 04/03/2019 ? ?  ?Carcinoma of overlapping sites of right breast in female, estrogen receptor  positive ([place])  ?05/25/2021 Initial Diagnosis  ? Carcinoma of overlapping sites of right breast in female, estrogen receptor positive ([place]) ?  ?05/25/2021 Cancer Staging  ? Staging form: Breast, AJCC 8th Edition ?- Pathologic: Stage IA (pT1b, pN0, cM0, G1, ER+, PR+, HER2-) - Signed by Cammie Sickle, MD on 05/25/2021 ?Histologic grading system: 3 grade system ? ?  ? ?HISTORY OF PRESENTING ILLNESS: Ambulating independently.  Alone. ?Glenda Gomez 71 y.o.  female with right upper lobe stage I lung cancer ; and stage I ER/PR positive breast cancer-currently on anastrozole is here for follow-up. ? ?Patient s/p evaluation with radiation oncology.  However she declined proceeding with radiation concerned about the side effects. ? ?Patient is on anastrozole [place]f hot flashes.  However overall improving.  Denies any worsening joint pains or back pain. ? ? ?Review of Systems  ?Constitutional:  Positive for malaise/fatigue. Negative for chills, diaphoresis, fever and weight loss.  ?HENT:  Negative for nosebleeds and sore throat.   ?Eyes:  Negative for double vision.  ?Respiratory:  Negative for hemoptysis, sputum production, shortness of breath and wheezing.   ?Cardiovascular:  Negative for chest pain, palpitations, orthopnea and leg swelling.  ?Gastrointestinal:  Negative for abdominal pain, blood in stool, constipation, diarrhea, heartburn, melena, nausea and  vomiting.  ?Genitourinary:  Negative for dysuria, frequency and urgency.  ?Musculoskeletal:  Positive for back pain and joint pain.  ?Skin: Negative.  Negative for itching and rash.  ?Neurological:  Negative for tingling and focal weakness.  ?Endo/Heme/Allergies:  Does not bruise/bleed easily.  ?Psychiatric/Behavioral:  Negative for depression. The patient is not nervous/anxious and does not have insomnia.    ? ?MEDICAL HISTORY:  ?Past Medical History:  ?Diagnosis Date  ? Adenocarcinoma ([place])   ? right upper lobe  ? Arthritis   ? Chicken pox   ? Dyspnea   ?  Environmental and seasonal allergies   ? Fibromyalgia   ? GERD (gastroesophageal reflux disease)   ? Hemorrhoid   ? Hypersomnia   ? Hypertension   ? Hypothyroidism   ? Lung cancer ([place])   ? Malignant neoplasm of right upper lobe of lung ([place]) 03/08/2019  ? Myositis ossificans   ? Osteoarthritis   ? Thyroid disease   ? ? ?SURGICAL HISTORY: ?Past Surgical History:  ?Procedure Laterality Date  ?  bengin tongue lesion    ? Benign 1980's  ? ABDOMINAL HYSTERECTOMY  1979  ? BACK SURGERY    ? bladder tack  2011  ? BREAST BIOPSY Left   ? neg  ? BREAST BIOPSY Right 02/22/2021  ? u/s bc ribbon 8:00 path pending  ? BREAST LUMPECTOMY WITH SENTINEL LYMPH NODE BIOPSY Right 03/05/2021  ? Procedure: BREAST LUMPECTOMY WITH SENTINEL LYMPH NODE BX;  Surgeon: Robert Bellow, MD;  Location: [facility] ORS;  Service: General;  Laterality: Right;  ? CHOLECYSTECTOMY  2010  ? COLONOSCOPY  12/22/2011  ? COLONOSCOPY WITH PROPOFOL N/A 03/24/2017  ? Procedure: COLONOSCOPY WITH PROPOFOL;  Surgeon: Lollie Sails, MD;  Location: [facility] ENDOSCOPY;  Service: Endoscopy;  Laterality: N/A;  ? COLONOSCOPY WITH PROPOFOL N/A 07/22/2020  ? Procedure: COLONOSCOPY WITH PROPOFOL;  Surgeon: Toledo, Benay Pike, MD;  Location: [facility] ENDOSCOPY;  Service: Gastroenterology;  Laterality: N/A;  ? ESOPHAGOGASTRODUODENOSCOPY (EGD) WITH PROPOFOL N/A 03/24/2017  ? Procedure: ESOPHAGOGASTRODUODENOSCOPY (EGD) WITH PROPOFOL;  Surgeon: Lollie Sails, MD;  Location: [facility] ENDOSCOPY;  Service: Endoscopy;  Laterality: N/A;  ? ESOPHAGOGASTRODUODENOSCOPY (EGD) WITH PROPOFOL N/A 07/22/2020  ? Procedure: ESOPHAGOGASTRODUODENOSCOPY (EGD) WITH PROPOFOL;  Surgeon: Toledo, Benay Pike, MD;  Location: [facility] ENDOSCOPY;  Service: Gastroenterology;  Laterality: N/A;  ? GASTRIC BYPASS OPEN    ? 7867'[id number] complicated by herniated stomach requiring reversal with mesh  ? HEMORRHOID SURGERY    ? INTERCOSTAL NERVE BLOCK Right 03/29/2019  ? Procedure: Intercostal Nerve Block;  Surgeon: [name], MD;  Location[facility];  Service: Thoracic;  Laterality: Right;  ? JOINT REPLACEMENT Bilateral   ? TKR  ? KNEE ARTHROPLASTY Right 07/06/2016  ? Procedure: COMPUTER ASSISTED TOTAL KNEE ARTHROPLASTY;  Surgeon: Dereck Leep, MD;  Location: [facility] ORS;  Service: Orthopedics;  Laterality: Right;  ? KNEE ARTHROSCOPY Left 10/19/2006  ? x 3  ? KNEE ARTHROSCOPY Right   ? x2  ? LOBECTOMY Right 03/29/2019  ? XI ROBOTIC ASSISTED THORASCOPY- RIGHT UPPER LOBECTOMY (Right Chest)  ? NODE DISSECTION  03/29/2019  ? Procedure: Node Dissection;  Surgeon: [name], MD;  Location: [facility];  Service: Thoracic;;  ? plantar fascia Left   ? PLANTAR FASCIA RELEASE Right   ? x 3  ? POSTERIOR LAMINECTOMY / DECOMPRESSION LUMBAR SPINE  06/24/2015  ? SHOULDER ARTHROSCOPY WITH DEBRIDEMENT AND BICEP TENDON REPAIR Left 09/25/2018  ? Procedure: SHOULDER ARTHROSCOPY WITH DEBRIDEMENT DECOMPRESSION AND BICEP TENDON REPAIR -  SCRUB TECH;  Surgeon: Corky Mull, MD;  Location: [facility] ORS;  Service: Orthopedics;  Laterality: Left;  ? TONSILLECTOMY    ? childhood  ? TOTAL KNEE ARTHROPLASTY Left 11/17/2004  ? VIDEO BRONCHOSCOPY WITH ENDOBRONCHIAL NAVIGATION N/A 03/04/2019  ? Procedure: VIDEO BRONCHOSCOPY WITH ENDOBRONCHIAL NAVIGATION;  Surgeon: Ottie Glazier, MD;  Location: [facility] ORS;  Service: Thoracic;  Laterality: N/A;  ? VIDEO BRONCHOSCOPY WITH ENDOBRONCHIAL ULTRASOUND N/A 03/04/2019  ? Procedure: VIDEO BRONCHOSCOPY WITH ENDOBRONCHIAL ULTRASOUND;  Surgeon: Ottie Glazier, MD;  Location: [facility] ORS;  Service: Thoracic;  Laterality: N/A;  ? ? ?SOCIAL HISTORY: ?Social History  ? ?Socioeconomic History  ? Marital status: Divorced  ?  Spouse name: Not on file  ? Number of children: Not on file  ? Years of education: Not on file  ? Highest education level: Not on file  ?Occupational History  ? Not on file  ?Tobacco Use  ? Smoking status: Former  ?  Packs/day: 2.00  ?  Years: 10.00  ?  Pack years: 20.00  ?  Types: Cigarettes  ?  Quit date:  07/17/1993  ?  Years since quitting: 27.8  ? Smokeless tobacco: Never  ?Vaping Use  ? Vaping Use: Never used  ?Substance and Sexual Activity  ? Alcohol use: No  ? Drug use: No  ? Sexual activity: Not on file  ?Oth

## 2021-05-25 NOTE — Assessment & Plan Note (Addendum)
#  Right breast invasive mammary carcinoma -PT1BN0 ER/PR positive HER2 negative- INVASIVE MAMMARY CARCINOMA, NO SPECIAL TYPE; grade 1; given low risk-okay to hold off Oncotype.  Would not recommend adjuvant chemotherapy; DECLINED Radiation. ? ?# Continue anastrozole at this time.  Patient tolerating fairly well except for hot flashes similar.  Again reviewed the potential side effects of anastrozole.  Recommend bone density test. ? ?#Hot flashes grade 1-2-second anastrozole.  Monitor for now.  If worse would recommend pharmacologic therapy. ? ?# RIGHT UPPER LOBE LUNG CA-stage I-status post resection clear margins-DEC 2022-stable exam no evidence of any progressive disease/recurrent disease.  Reasonable to switch over to annual imaging.  ? ?# Hx of gastric Bypass-no anemia- STABLE.  Patient awaiting repeat blood work including iron studies with PCP-[facility] in 1-2 months ? ?# mild hypokalemia: K 3.3-likely secondary to triamterene hydrochlorothiazide.  Recommend increased dietary intake of potassium rich foods. ??? ?# Barrets [EGD; Dr.Toledo- June 2022]  On PP1 EGD q 3 years-stable.  Discussed the natural history of Barrett's esophagus-0.12% risk per 1000-year annual progression.  Continue monitoring with GI. ? ?# Obesity: on Ozmepic- lost weight 25 pounds.  ? ? labs in june [PCP]  ?# DISPOSITION: ?# follow up in 2 months-  MD;BMD [if possible in [place]] prior-Dr..B ?

## 2021-05-25 NOTE — Progress Notes (Signed)
Would like to know what are some of the side effects of the Hormone treatment she is getting? ?

## 2021-06-07 ENCOUNTER — Encounter: Payer: Self-pay | Admitting: Internal Medicine

## 2021-06-10 ENCOUNTER — Other Ambulatory Visit: Payer: Self-pay | Admitting: Internal Medicine

## 2021-07-16 DIAGNOSIS — D751 Secondary polycythemia: Secondary | ICD-10-CM

## 2021-07-16 HISTORY — DX: Secondary polycythemia: D75.1

## 2021-07-21 ENCOUNTER — Ambulatory Visit
Admission: RE | Admit: 2021-07-21 | Discharge: 2021-07-21 | Disposition: A | Discharge status: Home / Self Care | Payer: Medicare HMO | Source: Ambulatory Visit | Attending: Internal Medicine | Admitting: Internal Medicine

## 2021-07-21 DIAGNOSIS — Z13820 Encounter for screening for osteoporosis: Secondary | ICD-10-CM | POA: Insufficient documentation

## 2021-07-21 DIAGNOSIS — Z780 Asymptomatic menopausal state: Secondary | ICD-10-CM | POA: Insufficient documentation

## 2021-07-21 DIAGNOSIS — Z853 Personal history of malignant neoplasm of breast: Secondary | ICD-10-CM | POA: Diagnosis not present

## 2021-07-21 DIAGNOSIS — Z170 Estrogen receptor positive status [ER+]: Secondary | ICD-10-CM

## 2021-07-21 DIAGNOSIS — E039 Hypothyroidism, unspecified: Secondary | ICD-10-CM | POA: Diagnosis not present

## 2021-07-26 ENCOUNTER — Ambulatory Visit: Payer: Medicare HMO | Admitting: Internal Medicine

## 2021-07-29 ENCOUNTER — Ambulatory Visit: Admission: RE | Admit: 2021-07-29 | Payer: Medicare HMO | Source: Ambulatory Visit

## 2021-08-02 ENCOUNTER — Ambulatory Visit: Payer: Medicare HMO

## 2021-08-03 ENCOUNTER — Ambulatory Visit
Admission: RE | Admit: 2021-08-03 | Discharge: 2021-08-03 | Disposition: A | Discharge status: Home / Self Care | Payer: Medicare HMO | Source: Ambulatory Visit | Attending: Nurse Practitioner | Admitting: Nurse Practitioner

## 2021-08-03 DIAGNOSIS — Z85118 Personal history of other malignant neoplasm of bronchus and lung: Secondary | ICD-10-CM | POA: Diagnosis present

## 2021-08-03 DIAGNOSIS — Z08 Encounter for follow-up examination after completed treatment for malignant neoplasm: Secondary | ICD-10-CM | POA: Diagnosis not present

## 2021-08-04 ENCOUNTER — Inpatient Hospital Stay: Payer: Medicare HMO | Attending: Internal Medicine

## 2021-08-04 ENCOUNTER — Inpatient Hospital Stay (HOSPITAL_BASED_OUTPATIENT_CLINIC_OR_DEPARTMENT_OTHER): Payer: Medicare HMO | Admitting: Internal Medicine

## 2021-08-04 ENCOUNTER — Other Ambulatory Visit: Payer: Self-pay

## 2021-08-04 ENCOUNTER — Encounter: Payer: Self-pay | Admitting: Internal Medicine

## 2021-08-04 VITALS — BP 154/103 | Temp 99.3°F | Ht 62.0 in | Wt 238.8 lb

## 2021-08-04 DIAGNOSIS — C3411 Malignant neoplasm of upper lobe, right bronchus or lung: Secondary | ICD-10-CM

## 2021-08-04 DIAGNOSIS — C50811 Malignant neoplasm of overlapping sites of right female breast: Secondary | ICD-10-CM

## 2021-08-04 DIAGNOSIS — Z170 Estrogen receptor positive status [ER+]: Secondary | ICD-10-CM | POA: Insufficient documentation

## 2021-08-04 DIAGNOSIS — E669 Obesity, unspecified: Secondary | ICD-10-CM | POA: Insufficient documentation

## 2021-08-04 DIAGNOSIS — Z85118 Personal history of other malignant neoplasm of bronchus and lung: Secondary | ICD-10-CM | POA: Insufficient documentation

## 2021-08-04 DIAGNOSIS — Z79899 Other long term (current) drug therapy: Secondary | ICD-10-CM | POA: Diagnosis not present

## 2021-08-04 DIAGNOSIS — N951 Menopausal and female climacteric states: Secondary | ICD-10-CM | POA: Insufficient documentation

## 2021-08-04 DIAGNOSIS — D751 Secondary polycythemia: Secondary | ICD-10-CM

## 2021-08-04 DIAGNOSIS — E876 Hypokalemia: Secondary | ICD-10-CM | POA: Diagnosis not present

## 2021-08-04 DIAGNOSIS — Z79811 Long term (current) use of aromatase inhibitors: Secondary | ICD-10-CM | POA: Diagnosis not present

## 2021-08-04 DIAGNOSIS — Z902 Acquired absence of lung [part of]: Secondary | ICD-10-CM | POA: Insufficient documentation

## 2021-08-04 DIAGNOSIS — H9319 Tinnitus, unspecified ear: Secondary | ICD-10-CM | POA: Insufficient documentation

## 2021-08-04 LAB — CBC WITH DIFFERENTIAL/PLATELET
Abs Immature Granulocytes: 0.02 10*3/uL (ref 0.00–0.07)
Basophils Absolute: 0.1 10*3/uL (ref 0.0–0.1)
Basophils Relative: 1 %
Eosinophils Absolute: 0.2 10*3/uL (ref 0.0–0.5)
Eosinophils Relative: 3 %
HCT: 46.1 % — ABNORMAL HIGH (ref 36.0–46.0)
Hemoglobin: 14.9 g/dL (ref 12.0–15.0)
Immature Granulocytes: 0 %
Lymphocytes Relative: 37 %
Lymphs Abs: 2.7 10*3/uL (ref 0.7–4.0)
MCH: 27 pg (ref 26.0–34.0)
MCHC: 32.3 g/dL (ref 30.0–36.0)
MCV: 83.7 fL (ref 80.0–100.0)
Monocytes Absolute: 0.5 10*3/uL (ref 0.1–1.0)
Monocytes Relative: 7 %
Neutro Abs: 3.8 10*3/uL (ref 1.7–7.7)
Neutrophils Relative %: 52 %
Platelets: 310 10*3/uL (ref 150–400)
RBC: 5.51 MIL/uL — ABNORMAL HIGH (ref 3.87–5.11)
RDW: 15.6 % — ABNORMAL HIGH (ref 11.5–15.5)
WBC: 7.2 10*3/uL (ref 4.0–10.5)
nRBC: 0 % (ref 0.0–0.2)

## 2021-08-04 LAB — VITAMIN D 25 HYDROXY (VIT D DEFICIENCY, FRACTURES): Vit D, 25-Hydroxy: 50.78 ng/mL (ref 30–100)

## 2021-08-04 LAB — COMPREHENSIVE METABOLIC PANEL
ALT: 13 U/L (ref 0–44)
AST: 25 U/L (ref 15–41)
Albumin: 3.8 g/dL (ref 3.5–5.0)
Alkaline Phosphatase: 76 U/L (ref 38–126)
Anion gap: 9 (ref 5–15)
BUN: 13 mg/dL (ref 8–23)
CO2: 27 mmol/L (ref 22–32)
Calcium: 9 mg/dL (ref 8.9–10.3)
Chloride: 101 mmol/L (ref 98–111)
Creatinine, Ser: 0.88 mg/dL (ref 0.44–1.00)
GFR, Estimated: >60 mL/min (ref >60)
Glucose, Bld: 100 mg/dL — ABNORMAL HIGH (ref 70–99)
Potassium: 3.7 mmol/L (ref 3.5–5.1)
Sodium: 137 mmol/L (ref 135–145)
Total Bilirubin: 0.7 mg/dL (ref 0.3–1.2)
Total Protein: 7.1 g/dL (ref 6.5–8.1)

## 2021-08-04 NOTE — Assessment & Plan Note (Addendum)
#  Right breast invasive mammary carcinoma -PT1BN0 ER/PR positive HER2 negative- INVASIVE MAMMARY CARCINOMA, NO SPECIAL TYPE; grade 1; given low risk-okay to hold off Oncotype.  Would not recommend adjuvant chemotherapy; DECLINED Radiation.  Stable  # Continue anastrozole at this time.  Patient tolerating fairly well except for hot flashes-not any worse.  # Hot flashes grade 1--second anastrozole. STABLE.   # Erythrocytosis-likely secondary- HCT- 16 [MAY 2023; PCP]; JUNE 2023- 14.9. will check JAK-2 next viist.   # RIGHT UPPER LOBE LUNG CA-stage I-status post resection clear margins-JUNE 2023--stable exam no evidence of any progressive disease/recurrent disease.    # screening for Osteoporosis: The BMD measured at Femur Neck Right is 1.042 g/cm2 with a T-scoreof 0.0. on Vit D; add calcium.   # mild hypokalemia: K 3.3-likely secondary to triamterene hydrochlorothiazide.  Recommend increased dietary intake of potassium rich foods.  # Tinnitus- [2 years; ENT]-ongoing headaches.  Defer to PCP/ENT  # Obesity: on Ozmepic- lost weight 25 pounds.   *ordered JAK-2 # DISPOSITION: # follow up in 4 months-  MD;labs- cbc/bmp; Dr.B  # I reviewed the blood work- with the patient in detail; also reviewed the imaging independently [as summarized above]; and with the patient in detail.

## 2021-08-04 NOTE — Progress Notes (Signed)
Wants more information on the polycythemia.

## 2021-08-04 NOTE — Progress Notes (Signed)
[place] NOTE  Patient Care Team: Tracie Harrier, MD as PCP - General (Internal Medicine) Telford Nab, RN as Oncology Nurse Navigator [name], MD as Consulting Physician (Cardiothoracic Surgery) Cammie Sickle, MD as Consulting Physician (Internal Medicine) Nestor Lewandowsky, MD (Inactive) as Referring Physician (Cardiothoracic Surgery) Theodore Demark, RN (Inactive) as Oncology Nurse Navigator  CHIEF COMPLAINTS/PURPOSE OF CONSULTATION: lung cancer/ Breast cancer  #  Oncology History Overview Note  # JAN 2021- ADENOCA [Dr.A; [place] 2021-]DEC 2020- 13 mm lobular somewhat branching right upper lobe lesion is not hypermetabolic.Number of Lymph Nodes Examined: 17 ; Pathologic Stage Classification (pTNM, AJCC 8th Edition): pT1c, pN0 [Dr.Henderickson]; STAGE I-adenocarcinoma; no adjuvant therapy  # Hx of barrets [[facility] -[facility]]  # Hx of garstic bypass [1970s]  SPECIMEN  Procedure: Lumpectomy  Specimen Laterality: Right   TUMOR  Histologic Type: Invasive mammary carcinoma, no special type (ductal)  Histologic Grade (Nottingham Histologic Score)                       Glandular (Acinar)/Tubular Differentiation: 1                       Nuclear Pleomorphism: 2                       Mitotic Rate: 1                       Overall Grade: 1  Tumor Size: 7 x 7 x 5 mm  Tumor Focality: Unifocal  Tumor Extent: Not applicable  Ductal Carcinoma In Situ (DCIS): Not identified  Lymphatic and/or Vascular Invasion: Not identified  Treatment Effect in the Breast: No known presurgical treatment         Malignant neoplasm of right upper lobe of lung ([place])  03/08/2019 Initial Diagnosis   Malignant neoplasm of right upper lobe of lung ([place])   04/03/2019 Cancer Staging   Staging form: Lung, AJCC 8th Edition - Clinical stage from 04/03/2019: ycT1, cN0, cM0 - Signed by [name], MD on 04/03/2019   Carcinoma of overlapping sites of right breast in  female, estrogen receptor positive ([place])  05/25/2021 Initial Diagnosis   Carcinoma of overlapping sites of right breast in female, estrogen receptor positive ([place])   05/25/2021 Cancer Staging   Staging form: Breast, AJCC 8th Edition - Pathologic: Stage IA (pT1b, pN0, cM0, G1, ER+, PR+, HER2-) - Signed by Cammie Sickle, MD on 05/25/2021 Histologic grading system: 3 grade system    HISTORY OF PRESENTING ILLNESS: Ambulating independently.  Alone. Glenda Gomez 71 y.o.  female with right upper lobe stage I lung cancer ; and stage I ER/PR positive breast cancer-currently on anastrozole is here for follow-up/review results of the CT scan chest and also bone density.  Patient told to have  polycythemia by PCP.  Very concerned about elevated red blood count.  Patient complains hot flashes.  However overall improving.  Denies any worsening joint pains or back pain.  Review of Systems  Constitutional:  Positive for malaise/fatigue. Negative for chills, diaphoresis, fever and weight loss.  HENT:  Negative for nosebleeds and sore throat.   Eyes:  Negative for double vision.  Respiratory:  Negative for hemoptysis, sputum production, shortness of breath and wheezing.   Cardiovascular:  Negative for chest pain, palpitations, orthopnea and leg swelling.  Gastrointestinal:  Negative for abdominal pain, blood in stool, constipation,  diarrhea, heartburn, melena, nausea and vomiting.  Genitourinary:  Negative for dysuria, frequency and urgency.  Musculoskeletal:  Positive for back pain and joint pain.  Skin: Negative.  Negative for itching and rash.  Neurological:  Negative for tingling and focal weakness.  Endo/Heme/Allergies:  Does not bruise/bleed easily.  Psychiatric/Behavioral:  Negative for depression. The patient is not nervous/anxious and does not have insomnia.      MEDICAL HISTORY:  Past Medical History:  Diagnosis Date   Adenocarcinoma ([place])    right upper lobe   Arthritis     Chicken pox    Dyspnea    Environmental and seasonal allergies    Fibromyalgia    GERD (gastroesophageal reflux disease)    Hemorrhoid    Hypersomnia    Hypertension    Hypothyroidism    Lung cancer ([place])    Malignant neoplasm of right upper lobe of lung ([place]) 03/08/2019   Myositis ossificans    Osteoarthritis    Polycythemia 07/16/2021   dx by Dr. Ginette Pitman   Thyroid disease     SURGICAL HISTORY: Past Surgical History:  Procedure Laterality Date    bengin tongue lesion     Benign 1980's   ABDOMINAL HYSTERECTOMY  1979   BACK SURGERY     bladder tack  2011   BREAST BIOPSY Left    neg   BREAST BIOPSY Right 02/22/2021   u/s bc ribbon 8:00 path pending   BREAST LUMPECTOMY WITH SENTINEL LYMPH NODE BIOPSY Right 03/05/2021   Procedure: BREAST LUMPECTOMY WITH SENTINEL LYMPH NODE BX;  Surgeon: Robert Bellow, MD;  Location: [facility];  Service: General;  Laterality: Right;   CHOLECYSTECTOMY  2010   COLONOSCOPY  12/22/2011   COLONOSCOPY WITH PROPOFOL N/A 03/24/2017   Procedure: COLONOSCOPY WITH PROPOFOL;  Surgeon: Lollie Sails, MD;  Location: [facility];  Service: Endoscopy;  Laterality: N/A;   COLONOSCOPY WITH PROPOFOL N/A 07/22/2020   Procedure: COLONOSCOPY WITH PROPOFOL;  Surgeon: Toledo, Benay Pike, MD;  Location: [facility];  Service: Gastroenterology;  Laterality: N/A;   ESOPHAGOGASTRODUODENOSCOPY (EGD) WITH PROPOFOL N/A 03/24/2017   Procedure: ESOPHAGOGASTRODUODENOSCOPY (EGD) WITH PROPOFOL;  Surgeon: Lollie Sails, MD;  Location: [facility] END[facility]COPY;  Service: Endoscopy;  Laterality: N/A;   ESOPHAGOGASTRODUODENOSCOPY (EGD) WITH PROPOFOL N/A 07/22/2020   Procedure: ESOPHAGOGASTRODUODENOSCOPY (EGD) WITH PROPOFOL;  Surgeon: Toledo, Benay Pike, MD;  Location: [facility];  Service: Gastroenterology;  Laterality: N/A;   GASTRIC BYPASS OPEN     3212'Y complicated by herniated stomach requiring reversal with mesh   HEMORRHOID SURGERY     INTERCOSTAL NERVE BLOCK Right  03/29/2019   Procedure: Intercostal Nerve Block;  Surgeon: [name], MD;  Location: [facility];  Service: Thoracic;  Laterality: Right;   JOINT REPLACEMENT Bilateral    TKR   KNEE ARTHROPLASTY Right 07/06/2016   Procedure: COMPUTER ASSISTED TOTAL KNEE ARTHROPLASTY;  Surgeon: Dereck Leep, MD;  Location: [facility];  Service: Orthopedics;  Laterality: Right;   KNEE ARTHROSCOPY Left 10/19/2006   x 3   KNEE ARTHROSCOPY Right    x2   LOBECTOMY Right 03/29/2019   XI ROBOTIC ASSISTED THORASCOPY- RIGHT UPPER LOBECTOMY (Right Chest)   NODE DISSECTION  03/29/2019   Procedure: Node Dissection;  Surgeon: [name], MD;  Location: [facility];  Service: Thoracic;;   plantar fascia Left    PLANTAR FASCIA RELEASE Right    x 3   POSTERIOR LAMINECTOMY / DECOMPRESSION LUMBAR SPINE  06/24/2015   SHOULDER ARTHROSCOPY WITH DEBRIDEMENT AND BICEP TENDON  REPAIR Left 09/25/2018   Procedure: SHOULDER ARTHROSCOPY WITH DEBRIDEMENT DECOMPRESSION AND BICEP TENDON REPAIR - SCRUB TECH;  Surgeon: Corky Mull, MD;  Location: [facility];  Service: Orthopedics;  Laterality: Left;   TONSILLECTOMY     childhood   TOTAL KNEE ARTHROPLASTY Left 11/17/2004   VIDEO BRONCHOSCOPY WITH ENDOBRONCHIAL NAVIGATION N/A 03/04/2019   Procedure: VIDEO BRONCHOSCOPY WITH ENDOBRONCHIAL NAVIGATION;  Surgeon: Ottie Glazier, MD;  Location: [facility];  Service: Thoracic;  Laterality: N/A;   VIDEO BRONCHOSCOPY WITH ENDOBRONCHIAL ULTRASOUND N/A 03/04/2019   Procedure: VIDEO BRONCHOSCOPY WITH ENDOBRONCHIAL ULTRASOUND;  Surgeon: Ottie Glazier, MD;  Location: [facility];  Service: Thoracic;  Laterality: N/A;    SOCIAL HISTORY: Social History   Socioeconomic History   Marital status: Divorced    Spouse name: Not on file   Number of children: Not on file   Years of education: Not on file   Highest education level: Not on file  Occupational History   Not on file  Tobacco Use   Smoking status: Former    Packs/day: 2.00     Years: 10.00    Total pack years: 20.00    Types: Cigarettes    Quit date: 07/17/1993    Years since quitting: 28.0   Smokeless tobacco: Never  Vaping Use   Vaping Use: Never used  Substance and Sexual Activity   Alcohol use: No   Drug use: No   Sexual activity: Not on file  Other Topics Concern   Not on file  Social History Narrative   1ppd- quit > 20 years ago; no alcohol; in [place]; lives self. retd- cook.    Social Determinants of Health   Financial Resource Strain: Not on file  Food Insecurity: Not on file  Transportation Needs: Not on file  Physical Activity: Not on file  Stress: Not on file  Social Connections: Not on file  Intimate Partner Violence: Not on file    FAMILY HISTORY: Family History  Problem Relation Age of Onset   Breast cancer Sister 11   Breast cancer Maternal Aunt 98   Lung cancer Maternal Aunt    Breast cancer Sister 35   Alzheimer's disease Mother    Arthritis Mother    Diabetes Mother    Heart failure Brother    Pancreatic cancer Brother 48   Lung cancer Brother    Liver cancer Father     ALLERGIES:  is allergic to flagyl [metronidazole], phentermine, and pregabalin.  MEDICATIONS:  Current Outpatient Medications  Medication Sig Dispense Refill   anastrozole (ARIMIDEX) 1 MG tablet Take 1 tablet (1 mg total) by mouth daily. 90 tablet 1   ascorbic acid (VITAMIN C) 1000 MG tablet Take 1,000 mg by mouth daily.     celecoxib (CELEBREX) 200 MG capsule Take 1 capsule (200 mg total) by mouth 2 (two) times daily with a meal. 60 capsule 1   furosemide (LASIX) 20 MG tablet Take 20 mg by mouth daily as needed for fluid or edema.     levothyroxine (SYNTHROID) 88 MCG tablet Take 88 mcg by mouth daily before breakfast.     omeprazole (PRILOSEC) 20 MG capsule Take 20 mg by mouth 2 (two) times daily before a meal.      oxyCODONE-acetaminophen (PERCOCET/ROXICET) 5-325 MG tablet Take 1 tablet by mouth every 8 (eight) hours as needed.     Semaglutide  (OZEMPIC, 0.25 OR 0.5 MG/DOSE, ) Inject 0.25 mg into the skin every Sunday.     triamterene-hydrochlorothiazide (MAXZIDE-25) 37.5-25 MG tablet  Take 1 tablet by mouth daily.     Vitamin D, Ergocalciferol, (DRISDOL) 1.25 MG (50000 UNIT) CAPS capsule Take 50,000 Units by mouth once a week. Friday     zolpidem (AMBIEN) 5 MG tablet Take 5-7.5 mg by mouth at bedtime.     No current facility-administered medications for this visit.      Marland Kitchen  PHYSICAL EXAMINATION: ECOG PERFORMANCE STATUS: 0 - Asymptomatic  Vitals:   08/04/21 1007  BP: (!) 154/103  Temp: 99.3 F (37.4 C)  SpO2: 96%   Filed Weights   08/04/21 1007  Weight: 238 lb 12.8 oz (108.3 kg)    Physical Exam Constitutional:      Comments: Obese. She is walking herself.  HENT:     Head: Normocephalic and atraumatic.     Mouth/Throat:     Pharynx: No oropharyngeal exudate.  Eyes:     Pupils: Pupils are equal, round, and reactive to light.  Cardiovascular:     Rate and Rhythm: Normal rate and regular rhythm.  Pulmonary:     Effort: Pulmonary effort is normal. No respiratory distress.     Breath sounds: Normal breath sounds. No wheezing.  Abdominal:     General: Bowel sounds are normal. There is no distension.     Palpations: Abdomen is soft. There is no mass.     Tenderness: There is no abdominal tenderness. There is no guarding or rebound.  Musculoskeletal:        General: No tenderness. Normal range of motion.     Cervical back: Normal range of motion and neck supple.  Skin:    General: Skin is warm.  Neurological:     Mental Status: She is alert and oriented to person, place, and time.  Psychiatric:        Mood and Affect: Affect normal.    LABORATORY DATA:  I have reviewed the data as listed Lab Results  Component Value Date   WBC 7.2 08/04/2021   HGB 14.9 08/04/2021   HCT 46.1 (H) 08/04/2021   MCV 83.7 08/04/2021   PLT 310 08/04/2021   Recent Labs    02/01/21 1432 03/04/21 1100 08/04/21 1001  NA  136  --  137  K 3.3* 3.2* 3.7  CL 99  --  101  CO2 28  --  27  GLUCOSE 110*  --  100*  BUN 17  --  13  CREATININE 0.85  --  0.88  CALCIUM 8.9  --  9.0  GFRNONAA >60  --  >60  PROT 7.3  --  7.1  ALBUMIN 3.9  --  3.8  AST 24  --  25  ALT 13  --  13  ALKPHOS 73  --  76  BILITOT 0.5  --  0.7    RADIOGRAPHIC STUDIES: I have personally reviewed the radiological images as listed and agreed with the findings in the report. CT Chest Wo Contrast  Result Date: 08/03/2021 CLINICAL DATA:  Non-small cell lung cancer follow-up evaluation. * Tracking Code: BO * EXAM: CT CHEST WITHOUT CONTRAST TECHNIQUE: Multidetector CT imaging of the chest was performed following the standard protocol without IV contrast. RADIATION DOSE REDUCTION: This exam was performed according to the departmental dose-optimization program which includes automated exposure control, adjustment of the mA and/or kV according to patient size and/or use of iterative reconstruction technique. COMPARISON:  January 28, 2021. FINDINGS: Cardiovascular: Calcified aortic atherosclerosis. Normal caliber of the thoracic aorta. Normal heart size without pericardial effusion or nodularity. Normal caliber  of central pulmonary vasculature. Limited assessment of cardiovascular structures given lack of intravenous contrast. Mediastinum/Nodes: Postoperative changes about the RIGHT hilum. No signs of adenopathy in the mediastinum. Normal esophagus. Lungs/Pleura: Post partial lung resection in the RIGHT chest, signs of wedge resection and RIGHT upper lobectomy without change. No consolidation. Trace amount of pleural thickening in the RIGHT lung base similar to previous imaging. Tiny nodule along the pleural surface in the LEFT chest is stable at 3 mm (image 87/3) airways are patent. Stable RIGHT lower lobe pulmonary nodule 3 mm (image 63/3) Nodular appearing areas at the RIGHT lung base largest area approximately 9-10 mm favored to represent scarring and not  changed since May of 2022 (image 102/3). Upper Abdomen: Imaged portions of liver, pancreas, spleen, adrenal glands and kidneys without acute findings. Stable appearance of RIGHT upper pole renal lesion measuring approximately 2.3 cm with low-density likely representing sequela of decompressed renal cyst, cyst stable to slightly increased in size dating back to 2021. No dedicated follow-up recommended this finding. No upper abdominal lymphadenopathy. Post gastric bypass. Musculoskeletal: No acute bone finding. No destructive bone process. Spinal degenerative changes. IMPRESSION: 1. Post partial lung resection in the RIGHT chest, signs of wedge resection and RIGHT upper lobectomy without change. 2. Stable small areas of nodularity about the RIGHT and LEFT chest. 3. Nodular area at the RIGHT lung base favored to be related to pleural and parenchymal scarring at the RIGHT base, no change since May of 2022. Attention on follow-up. 4. Post gastric bypass. Aortic Atherosclerosis (ICD10-I70.0). Electronically Signed   By: Zetta Bills M.D.   On: 08/03/2021 09:43   DG Bone Density  Result Date: 07/21/2021 EXAM: DUAL X-RAY ABSORPTIOMETRY (DXA) FOR BONE MINERAL DENSITY IMPRESSION: Your patient Hien Cunliffe completed a BMD test on 07/21/2021 using the [employer] (software version: 14.10) manufactured by [employer]. The following summarizes the results of our evaluation. Technologist: [facility] PATIENT BIOGRAPHICAL: Name: Bryli, Mantey Patient ID: [id number] Birth Date: 1950/09/06 Height: 62.0 in. Gender: Female Exam Date: 07/21/2021 Weight: 236.0 lbs. Indications: Caucasian, History of Breast Cancer, Hypothyroid, Hysterectomy, Postmenopausal Fractures: Treatments: Synthroid, Vitamin D DENSITOMETRY RESULTS: Site         Region     Measured Date Measured Age WHO Classification Young Adult T-score BMD         %Change vs. Previous Significant Change (*) DualFemur Neck Right 07/21/2022 69.9 Normal 0.0 1.042  g/cm2 Left Forearm Radius 33% 07/21/2021 69.9 Normal 0.9 0.958 g/cm2 ASSESSMENT: The BMD measured at Femur Neck Right is 1.042 g/cm2 with a T-score of 0.0. This patient is considered normal according to [facility]) criteria. The scan quality is good. Lumbar spine was not utilized due to advanced degenerative changes. World Pharmacologist [facility]) criteria for post-menopausal, Caucasian Women: Normal:                   T-score at or above -1 SD Osteopenia/low bone mass: T-score between -1 and -2.5 SD Osteoporosis:             T-score at or below -2.5 SD RECOMMENDATIONS: 1. All patients should optimize calcium and vitamin D intake. 2. Consider FDA-approved medical therapies in postmenopausal women and men aged 50 years and older, based on the following: a. A hip or vertebral(clinical or morphometric) fracture b. T-score < -2.5 at the femoral neck or spine after appropriate evaluation to exclude secondary causes c. Low bone mass (T-score between -1.0 and -2.5 at the femoral  neck or spine) and a 10-year probability of a hip fracture > 3% or a 10-year probability of a major osteoporosis-related fracture > 20% based on the [place]-adapted WHO algorithm 3. Clinician judgment and/or patient preferences may indicate treatment for people with 10-year fracture probabilities above or below these levels FOLLOW-UP: People with diagnosed cases of osteoporosis or at high risk for fracture should have regular bone mineral density tests. For patients eligible for Medicare, routine testing is allowed once every 2 years. The testing frequency can be increased to one year for patients who have rapidly progressing disease, those who are receiving or discontinuing medical therapy to restore bone mass, or have additional risk factors. I have reviewed this report, and agree with the above findings. [facility] Electronically Signed   By: Elmer Picker M.D.   On: 07/21/2021 13:31     ASSESSMENT &  PLAN:   Carcinoma of overlapping sites of right breast in female, estrogen receptor positive ([place]) #Right breast invasive mammary carcinoma -PT1BN0 ER/PR positive HER2 negative- INVASIVE MAMMARY CARCINOMA, NO SPECIAL TYPE; grade 1; given low risk-okay to hold off Oncotype.  Would not recommend adjuvant chemotherapy; DECLINED Radiation.  Stable  # Continue anastrozole at this time.  Patient tolerating fairly well except for hot flashes-not any worse.  # Hot flashes grade 1--second anastrozole. STABLE.   # Erythrocytosis-likely secondary- HCT- 16 [MAY 2023; PCP]; JUNE 2023- 14.9. will check JAK-2 next viist.   # RIGHT UPPER LOBE LUNG CA-stage I-status post resection clear margins-JUNE 2023--stable exam no evidence of any progressive disease/recurrent disease.    # screening for Osteoporosis: The BMD measured at Femur Neck Right is 1.042 g/cm2 with a T-scoreof 0.0. on Vit D; add calcium.   # mild hypokalemia: K 3.3-likely secondary to triamterene hydrochlorothiazide.  Recommend increased dietary intake of potassium rich foods.  # Tinnitus- [2 years; ENT]-ongoing headaches.  Defer to PCP/ENT    # Obesity: on Ozmepic- lost weight 25 pounds.   # DISPOSITION: # follow up in 4 months-  MD;labs- cbc/bmp; Dr.B  # I reviewed the blood work- with the patient in detail; also reviewed the imaging independently [as summarized above]; and with the patient in detail.    All questions were answered. The patient knows to call the clinic with any problems, questions or concerns.    Cammie Sickle, MD 08/04/2021 12:37 PM

## 2021-09-16 ENCOUNTER — Ambulatory Visit (INDEPENDENT_AMBULATORY_CARE_PROVIDER_SITE_OTHER): Payer: Medicare HMO | Admitting: Neurosurgery

## 2021-09-16 ENCOUNTER — Encounter: Payer: Self-pay | Admitting: Neurosurgery

## 2021-09-16 VITALS — BP 138/82 | Ht 62.0 in | Wt 234.8 lb

## 2021-09-16 DIAGNOSIS — M5442 Lumbago with sciatica, left side: Secondary | ICD-10-CM

## 2021-09-16 DIAGNOSIS — M542 Cervicalgia: Secondary | ICD-10-CM

## 2021-09-16 DIAGNOSIS — M5416 Radiculopathy, lumbar region: Secondary | ICD-10-CM

## 2021-09-16 DIAGNOSIS — M5441 Lumbago with sciatica, right side: Secondary | ICD-10-CM

## 2021-09-16 DIAGNOSIS — G8929 Other chronic pain: Secondary | ICD-10-CM

## 2021-09-16 DIAGNOSIS — M4712 Other spondylosis with myelopathy, cervical region: Secondary | ICD-10-CM

## 2021-09-16 MED ORDER — CYCLOBENZAPRINE HCL 5 MG PO TABS: 7.5000 mg | ORAL_TABLET | Freq: Three times a day (TID) | ORAL | Status: DC | PRN

## 2021-09-16 NOTE — Progress Notes (Signed)
Referring Physician:  Tracie Harrier, MD [address] [facility] [place],  [place] [address]  Primary Physician:  Tracie Harrier, MD  History of Present Illness: 09/16/2021 [name] is a 71 y.o with a history of breast CA, lung CA s/p resection, hypothyroidism, GERD, insomnia, Polycythemia,  here today with a chief complaint of spinal pain.  She states that she has had interscapular pain for about 5 years with worsening over the last 6-8 months. She endorses numbness and tingling into her hands interimittently and difficulty with dexterity. She denies any pain that radiates down the length of her arms.  In addition to her neck pain she describes low back pain that is largely constant and radiates into her hips, lateral legs, and knees without any radiation beyond her knees.  Both her neck and back pain are worse with standing unassisted and improve with rest or leaning forward with support. She did have lumbar decompression in 2017 which helped with her radiating leg pain at that time. She states her current symptoms are different now and are more nagging in nature.  She is currently taking Celebrex and Oxycodone as needed. She has attempted Gabapentin in the past but this causes cognitive side effects and did not improve her pain. She has not undergone any PT or injections.   Conservative measures:  Physical therapy: no recent PT for back or neck   Multimodal medical therapy including regular antiinflammatories: Celebrex and Percocet  Injections: no epidural steroid injections  Past Surgery: left shoulder surgery, bilateral knee replacements and Lumbar decompression by Dr. Marcos Eke in 2017  Glenda Gomez has symptoms concerning for questionable cervical myelopathy.  The symptoms are causing a significant impact on the patient's life.   Review of Systems:  A 10 point review of systems is negative, except for the pertinent positives and negatives detailed in  the HPI.  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 Ginette Pitman   Thyroid disease     Past Surgical History: Past Surgical History:  Procedure Laterality Date    bengin tongue lesion     Benign 1980's   ABDOMINAL HYSTERECTOMY  1979   BACK SURGERY     bladder tack  2011   BREAST BIOPSY Left    neg   BREAST BIOPSY Right 02/22/2021   u/s bc ribbon 8:00 path pending   BREAST LUMPECTOMY WITH SENTINEL LYMPH NODE BIOPSY Right 03/05/2021   Procedure: BREAST LUMPECTOMY WITH SENTINEL LYMPH NODE BX;  Surgeon: Robert Bellow, MD;  Location: [facility];  Service: General;  Laterality: Right;   CHOLECYSTECTOMY  2010   COLONOSCOPY  12/22/2011   COLONOSCOPY WITH PROPOFOL N/A 03/24/2017   Procedure: COLONOSCOPY WITH PROPOFOL;  Surgeon: Lollie Sails, MD;  Location: [facility];  Service: Endoscopy;  Laterality: N/A;   COLONOSCOPY WITH PROPOFOL N/A 07/22/2020   Procedure: COLONOSCOPY WITH PROPOFOL;  Surgeon: Toledo, Benay Pike, MD;  Location: [facility];  Service:  Gastroenterology;  Laterality: N/A;   ESOPHAGOGASTRODUODENOSCOPY (EGD) WITH PROPOFOL N/A 03/24/2017   Procedure: ESOPHAGOGASTRODUODENOSCOPY (EGD) WITH PROPOFOL;  Surgeon: Lollie Sails, MD;  Location: [facility] END[facility]COPY;  Service: Endoscopy;  Laterality: N/A;   ESOPHAGOGASTRODUODENOSCOPY (EGD) WITH PROPOFOL N/A 07/22/2020   Procedure: ESOPHAGOGASTRODUODENOSCOPY (EGD) WITH PROPOFOL;  Surgeon: Toledo, Benay Pike, MD;  Location: [facility];  Service: Gastroenterology;  Laterality: N/A;   GASTRIC  BYPASS OPEN     1610'R complicated by herniated stomach requiring reversal with mesh   HEMORRHOID SURGERY     INTERCOSTAL NERVE BLOCK Right 03/29/2019   Procedure: Intercostal Nerve Block;  Surgeon: [name], MD;  Location: [place];  Service: Thoracic;  Laterality: Right;   JOINT REPLACEMENT Bilateral    TKR   KNEE ARTHROPLASTY Right 07/06/2016   Procedure: COMPUTER ASSISTED TOTAL KNEE ARTHROPLASTY;  Surgeon: Dereck Leep, MD;  Location: [facility];  Service: Orthopedics;  Laterality: Right;   KNEE ARTHROSCOPY Left 10/19/2006   x 3   KNEE ARTHROSCOPY Right    x2   LOBECTOMY Right 03/29/2019   XI ROBOTIC ASSISTED THORASCOPY- RIGHT UPPER LOBECTOMY (Right Chest)   NODE DISSECTION  03/29/2019   Procedure: Node Dissection;  Surgeon: [name], MD;  Location: [place];  Service: Thoracic;;   plantar fascia Left    PLANTAR FASCIA RELEASE Right    x 3   POSTERIOR LAMINECTOMY / DECOMPRESSION LUMBAR SPINE  06/24/2015   SHOULDER ARTHROSCOPY WITH DEBRIDEMENT AND BICEP TENDON REPAIR Left 09/25/2018   Procedure: SHOULDER ARTHROSCOPY WITH DEBRIDEMENT DECOMPRESSION AND BICEP TENDON REPAIR - SCRUB TECH;  Surgeon: Corky Mull, MD;  Location: [facility];  Service: Orthopedics;  Laterality: Left;   TONSILLECTOMY     childhood   TOTAL KNEE ARTHROPLASTY Left 11/17/2004   VIDEO BRONCHOSCOPY WITH ENDOBRONCHIAL NAVIGATION N/A 03/04/2019   Procedure: VIDEO BRONCHOSCOPY WITH ENDOBRONCHIAL NAVIGATION;  Surgeon: Ottie Glazier, MD;  Location: [facility];  Service: Thoracic;  Laterality: N/A;   VIDEO BRONCHOSCOPY WITH ENDOBRONCHIAL ULTRASOUND N/A 03/04/2019   Procedure: VIDEO BRONCHOSCOPY WITH ENDOBRONCHIAL ULTRASOUND;  Surgeon: Ottie Glazier, MD;  Location: [facility];  Service: Thoracic;  Laterality: N/A;    Allergies: Allergies as of 09/16/2021 - Review Complete 09/16/2021  Allergen Reaction Noted   Flagyl [metronidazole] Diarrhea and Nausea And Vomiting 07/17/2013   Phentermine Other  (See Comments) 07/21/2020   Pregabalin Other (See Comments) 07/17/2013    Medications: Outpatient Encounter Medications as of 09/16/2021  Medication Sig   anastrozole (ARIMIDEX) 1 MG tablet Take 1 tablet (1 mg total) by mouth daily.   ascorbic acid (VITAMIN C) 1000 MG tablet Take 1,000 mg by mouth daily.   celecoxib (CELEBREX) 200 MG capsule Take 1 capsule (200 mg total) by mouth 2 (two) times daily with a meal.   furosemide (LASIX) 20 MG tablet Take 20 mg by mouth daily as needed for fluid or edema.   levothyroxine (SYNTHROID) 88 MCG tablet Take 88 mcg by mouth daily before breakfast.   omeprazole (PRILOSEC) 20 MG capsule Take 20 mg by mouth 2 (two) times daily before a meal.    oxyCODONE-acetaminophen (PERCOCET/ROXICET) 5-325 MG tablet Take 1 tablet by mouth every 8 (eight) hours as needed.   Semaglutide (OZEMPIC, 0.25 OR 0.5 MG/DOSE, [place]) Inject 0.25 mg into the skin every Sunday.   triamterene-hydrochlorothiazide (MAXZIDE-25) 37.5-25 MG tablet Take 1 tablet by mouth daily.   Vitamin D, Ergocalciferol, (DRISDOL) 1.25 MG (50000 UNIT) CAPS capsule Take 50,000 Units by mouth once a week. Friday   zolpidem (  AMBIEN) 5 MG tablet Take 5-7.5 mg by mouth at bedtime.   No facility-administered encounter medications on file as of 09/16/2021.    Social History: Social History   Tobacco Use   Smoking status: Former    Packs/day: 2.00    Years: 10.00    Total pack years: 20.00    Types: Cigarettes    Quit date: 07/17/1993    Years since quitting: 28.1   Smokeless tobacco: Never  Vaping Use   Vaping Use: Never used  Substance Use Topics   Alcohol use: No   Drug use: No    Family Medical History: Family History  Problem Relation Age of Onset   Breast cancer Sister 55   Breast cancer Maternal Aunt 1   Lung cancer Maternal Aunt    Breast cancer Sister 64   Alzheimer's disease Mother    Arthritis Mother    Diabetes Mother    Heart failure Brother    Pancreatic cancer Brother 77   Lung  cancer Brother    Liver cancer Father     Physical Examination:  Today's Vitals   09/16/21 1002  BP: 138/82  Weight: 106.5 kg  Height: 5\' 2"  (1.575 m)  PainSc: 4   PainLoc: Back   Body mass index is 42.95 kg/m.  General: Patient is well developed, well nourished, calm, collected, and in no apparent distress. Attention to examination is appropriate.  Psychiatric: Patient is non-anxious.  Head:  Pupils equal, round, and reactive to light.  ENT:  Oral mucosa appears well hydrated.  Neck:   Supple.   Respiratory: Patient is breathing without any difficulty.  Extremities: No edema.  Vascular: Palpable dorsal pedal pulses.  Skin:   On exposed skin, there are no abnormal skin lesions.  NEUROLOGICAL:     Awake, alert, oriented to person, place, and time.  Speech is clear and fluent. Fund of knowledge is appropriate.   Cranial Nerves: Pupils equal round and reactive to light.  Facial tone is symmetric.  Facial sensation is symmetric.  ROM of spine: limited due to pain  Palpation of spine: diffuse TTP in neck, intrascapular region and low back   Strength: Side Biceps Triceps Deltoid Interossei Grip Wrist Ext. Wrist Flex.  R 5 5 5 5 5 5 5   L 5 5 5 5 5 5 5    Side Iliopsoas Quads Hamstring PF DF EHL  R 5 5 5 5 5 5   L 5 5 5 5 5 5    Reflexes are 2+ and symmetric at the biceps, triceps, brachioradialis, patella and achilles.   Hoffman's is absent.  Clonus is not present.  Toes are down-going.  Bilateral upper and lower extremity sensation is intact to light touch.    Ambulates with a slowed. Flexed gait  Medical Decision Making  Imaging: No recent spinal imaging to review  Assessment and Plan: Ms. Keltz is a pleasant 71 y.o. female with both cervical and lumbar complaints. Her cervical symptoms are concerning for possible early cervical myelopathy and lumbar radiculopathy. I have MRIs of her cervical and lumbar spine for further evaluation. I have also placed a referral  for PT at [facility] for dry needling, strength training. I have given her a prescription for Flexeril to take as needed and educated her on possible side effects. I will see her back via telephone visit after her MRIs to review her results and further plan of care. She was encouraged to contact or office in the interim with any additional questions or  concerns. She expressed understanding and was in agreement with this time.    Thank you for involving me in the care of this patient.   I spent a total of 35 minutes in both face-to-face and non-face-to-face activities for this visit on the date of this encounter including review of outside records, discussion of symptoms, physical exam, discussion of DDX, and documentation.   [facility] of Neurosurgery

## 2021-09-28 ENCOUNTER — Other Ambulatory Visit: Payer: Self-pay | Admitting: Neurosurgery

## 2021-09-28 MED ORDER — CYCLOBENZAPRINE HCL 10 MG PO TABS: 10.0000 mg | ORAL_TABLET | Freq: Three times a day (TID) | ORAL | 0 refills | Status: DC | PRN

## 2021-10-15 ENCOUNTER — Other Ambulatory Visit: Payer: Self-pay

## 2021-10-15 DIAGNOSIS — M5416 Radiculopathy, lumbar region: Secondary | ICD-10-CM

## 2021-10-20 ENCOUNTER — Ambulatory Visit
Admission: RE | Admit: 2021-10-20 | Discharge: 2021-10-20 | Disposition: A | Discharge status: Home / Self Care | Payer: Medicare HMO | Attending: Orthopedic Surgery | Admitting: Orthopedic Surgery

## 2021-10-20 ENCOUNTER — Ambulatory Visit
Admission: RE | Admit: 2021-10-20 | Discharge: 2021-10-20 | Disposition: A | Discharge status: Home / Self Care | Payer: Medicare HMO | Source: Ambulatory Visit | Attending: Orthopedic Surgery | Admitting: Orthopedic Surgery

## 2021-10-20 DIAGNOSIS — M5416 Radiculopathy, lumbar region: Secondary | ICD-10-CM

## 2021-10-21 ENCOUNTER — Encounter: Payer: Self-pay | Admitting: Orthopedic Surgery

## 2021-10-21 NOTE — Progress Notes (Signed)
Lumbar xrays dated 10/21/21 show: FINDINGS: There are 5 lumbar-type vertebral bodies. Mild levoscoliosis centered at T12-L1. There is no evidence of lumbar spine fracture.   Stable grade 1 anterolisthesis of L4 on L5. Interval development of retrolisthesis of L1 on L2, measuring approximately 0.5 cm, which does not change with flexion or extension. There is resulting at least moderate neural foraminal narrowing at the L1-L2 level. Mild disc space narrowing at the L1-L2 through L4-L5 levels. Advanced degenerative disc disease at L5-S1 with vacuum disc phenomenon. Hypertrophic facet arthropathy, most pronounced at the lower lumbar levels.   Aortic calcifications. Surgical staples noted in the left upper abdomen. Surgical clips in the right upper abdomen.   IMPRESSION: 1. Interval development of stable spondylolisthesis at L1-L2 with resulting at least moderate neural foraminal narrowing. MRI of the lumbar spine would be helpful to further evaluate degree of neural foraminal narrowing. 2. Mild levoscoliosis. Mild degenerative disc disease throughout the lumbar spine with advanced degenerative disc disease L5-S1. 3. Hypertrophic facet arthropathy, most pronounced at the lower lumbar levels. 4.  Aortic Atherosclerosis (ICD10-I70.0).     Electronically Signed   By: Ileana Roup M.D.   On: 10/21/2021 10:21  I reviewed xrays and agree with above findings.

## 2021-10-27 ENCOUNTER — Ambulatory Visit
Admission: RE | Admit: 2021-10-27 | Discharge: 2021-10-27 | Disposition: A | Discharge status: Home / Self Care | Payer: Medicare HMO | Source: Ambulatory Visit | Attending: Neurosurgery | Admitting: Neurosurgery

## 2021-10-27 DIAGNOSIS — G8929 Other chronic pain: Secondary | ICD-10-CM | POA: Insufficient documentation

## 2021-10-27 DIAGNOSIS — M5442 Lumbago with sciatica, left side: Secondary | ICD-10-CM | POA: Insufficient documentation

## 2021-10-27 DIAGNOSIS — M4712 Other spondylosis with myelopathy, cervical region: Secondary | ICD-10-CM | POA: Insufficient documentation

## 2021-10-27 DIAGNOSIS — M5441 Lumbago with sciatica, right side: Secondary | ICD-10-CM | POA: Insufficient documentation

## 2021-10-27 DIAGNOSIS — M5416 Radiculopathy, lumbar region: Secondary | ICD-10-CM | POA: Diagnosis present

## 2021-10-27 DIAGNOSIS — M542 Cervicalgia: Secondary | ICD-10-CM | POA: Insufficient documentation

## 2021-11-01 NOTE — Progress Notes (Unsigned)
Telephone Visit- Progress Note: Referring Physician:  Geronimo Boot, PA-C [place],  [place] [address]  Primary Physician:  Tracie Harrier, MD  This visit was performed via telephone.  Patient location: home Provider location: office  I spent a total of 25 minutes non-face-to-face activities for this visit on the date of this encounter including review of current clinical condition and response to treatment.   Chief Complaint:  phone visit to review cervical/lumbar MRI scans.   Last seen by Andee Poles on 09/16/21 for possible early cervical and lumbar radiculopathy. She was sent to PT and given flexeril.   History of Present Illness: She continues with pain in her neck that is more like a "tired pressure" and tightness. No pain in her arms. Numbness in hands has improved and is no longer an issue. Head feels too heavy for her neck to hold it up. She has pain into both shoulders. History of left rotator cuff repair and right rotator cuff tear that was not fixed.   Also with persistent LBP that radiates into her hips and knees. She has right knee pain- known history of bilateral TKAs. Pain in lower back is worse with standing, walking, and laying flat. No pain with sitting.   She has known diagnosis of myositis and FM which complicates her clinical picture.   She continues on celebrex, flexeril, and prn percocet.   She went to 1 visit of PT at [facility] and felt like they did not listen to her. She does not want to go back. She would be willing to try PT at [facility] in [place].   Exam: No exam done as this was a telephone encounter.     Imaging: MRI of cervical spine 10/27/21:  FINDINGS: Alignment: Physiologic.   Vertebrae: No fracture, evidence of discitis, or bone lesion.   Cord: Normal signal and morphology.   Posterior Fossa, vertebral arteries, paraspinal tissues: Negative.   Disc levels:   C1-C2: No significant degenerative change.   C2-C3: No  disc bulge. No spinal canal stenosis. Mild bilateral facet degenerative change. Mild left neural foraminal narrowing.   C3-C4: No disc bulge. Moderate bilateral facet degenerative change. Mild-to-moderate left neural foraminal narrowing. No spinal canal stenosis.   C4-C5: No disc bulge. Moderate bilateral facet degenerative change. Mild left neural foraminal narrowing. No spinal canal stenosis.   C5-C6: Eccentric left disc bulge with narrowing of the left lateral recess. Mild bilateral facet degenerative change. Moderate to severe left neural foraminal narrowing (series 6, image 20). Ligamentum flavum hypertrophy. Mild spinal canal stenosis.   C6-C7: Uncovertebral hypertrophy. No disc bulge. No spinal canal stenosis. Mild bilateral neural foraminal stenosis.   C7-T1: Unremarkable.   IMPRESSION: Mild-to-moderate left-sided neural foraminal stenosis at C3-C4 and moderate to severe left-sided neural foraminal stenosis at C5-C6. No evidence of high-grade spinal canal stenosis.     Electronically Signed   By: Marin Roberts M.D.   On: 10/27/2021 15:02   MRI of lumbar spine 10/27/21:  FINDINGS: Segmentation:  5 lumbar type vertebral bodies.   Alignment: 2 mm retrolisthesis L1-2 and L2-3. 2 mm anterolisthesis L4-5. Scoliotic curvature convex to the left in the thoracolumbar region and towards the right in the lower lumbar region.   Vertebrae:  No fracture or focal bone lesion.   Conus medullaris and cauda equina: Conus extends to the L1-2 level. Conus and cauda equina appear normal.   Paraspinal and other soft tissues: Negative   Disc levels:   T11-12: Normal  T12-L1: Chronic loss of disc height. No bulge or herniation. No stenosis.   L1-2: Previous posterior decompression. 2 mm retrolisthesis. Bulging of the disc. No compressive stenosis of the canal. Mild bilateral foraminal narrowing.   L2-3: Previous posterior decompression. 2 mm retrolisthesis. Mild bulging of  the disc. No compressive stenosis of the canal. Mild bilateral foraminal narrowing.   L3-4: Normal interspace.   L4-5: Bilateral facet osteoarthritis with 2 mm of anterolisthesis. Minimal bulging of the disc. No stenosis.   L5-S1: Endplate osteophytes and mild bulging of the disc. Minimal facet degeneration. No stenosis.   IMPRESSION: Since the prior study of 2017, the patient has had posterior decompression at L1-2 and L2-3. There is 2 mm of retrolisthesis presently at each of those 2 levels. There is bulging of the discs. No compressive narrowing of the canal. Bilateral foraminal narrowing is present at those levels which could possibly affect the L1 and or L2 nerves.   L4-5 facet osteoarthritis with 2 mm of anterolisthesis. No compressive stenosis. No edematous change of the facet joints. No significant change since 2017.     Electronically Signed   By: Nelson Chimes M.D.   On: 10/27/2021 15:00   Xrays of lumbar spine 10/20/21:  FINDINGS: There are 5 lumbar-type vertebral bodies. Mild levoscoliosis centered at T12-L1. There is no evidence of lumbar spine fracture.   Stable grade 1 anterolisthesis of L4 on L5. Interval development of retrolisthesis of L1 on L2, measuring approximately 0.5 cm, which does not change with flexion or extension. There is resulting at least moderate neural foraminal narrowing at the L1-L2 level. Mild disc space narrowing at the L1-L2 through L4-L5 levels. Advanced degenerative disc disease at L5-S1 with vacuum disc phenomenon. Hypertrophic facet arthropathy, most pronounced at the lower lumbar levels.   Aortic calcifications. Surgical staples noted in the left upper abdomen. Surgical clips in the right upper abdomen.   IMPRESSION: 1. Interval development of stable spondylolisthesis at L1-L2 with resulting at least moderate neural foraminal narrowing. MRI of the lumbar spine would be helpful to further evaluate degree of neural foraminal  narrowing. 2. Mild levoscoliosis. Mild degenerative disc disease throughout the lumbar spine with advanced degenerative disc disease L5-S1. 3. Hypertrophic facet arthropathy, most pronounced at the lower lumbar levels. 4.  Aortic Atherosclerosis (ICD10-I70.0).     Electronically Signed   By: Ileana Roup M.D.   On: 10/21/2021 10:21   I have personally reviewed the images and agree with the above interpretation.   Assessment and Plan: Ms. Mcilvain is a pleasant 71 y.o. female with "tired pressure" and tightness. No pain in her arms.   Cervical MRI shows cervical spondylosiswith mild-to-moderate left-sided neural foraminal stenosis at C3-C4 and moderate to severe left-sided neural foraminal stenosis at C5-C6.   No evidence of high-grade spinal canal stenosis.  Also with persistent LBP that radiates into her hips and knees. History of lumbar decompression.   Lumbar MRI/xrays show retrolisthesis L1-L2, slip L4-L5, diffuse spondylosis and DDD with vacuum disc at L5-S1.   She has known diagnosis of myositis and FM which complicates her clinical picture. Neck pain may be more myofascial in nature. LBP likely from facets and DDD.   Above treatment options discussed with patient and following plan made:   - Order for physical therapy for cervical/lumbar spine to [facility] in [place].  - Discussed referral for cervical/lumbar injections. She is not interested. May revisit if pain gets worse.  - Continue on current medications including celebrex. Reviewed proper dosing along  with risks and benefits. Take with food.  - Continue prn flexeril.  - Follow up in 6-8 weeks with phone visit at her request. She does not like to drive.   Geronimo Boot PA-C Neurosurgery

## 2021-11-02 ENCOUNTER — Encounter: Payer: Self-pay | Admitting: Orthopedic Surgery

## 2021-11-02 ENCOUNTER — Ambulatory Visit (INDEPENDENT_AMBULATORY_CARE_PROVIDER_SITE_OTHER): Payer: Medicare HMO | Admitting: Orthopedic Surgery

## 2021-11-02 DIAGNOSIS — M4316 Spondylolisthesis, lumbar region: Secondary | ICD-10-CM

## 2021-11-02 DIAGNOSIS — M5136 Other intervertebral disc degeneration, lumbar region: Secondary | ICD-10-CM | POA: Diagnosis not present

## 2021-11-02 DIAGNOSIS — M47812 Spondylosis without myelopathy or radiculopathy, cervical region: Secondary | ICD-10-CM

## 2021-11-02 DIAGNOSIS — M7918 Myalgia, other site: Secondary | ICD-10-CM | POA: Diagnosis not present

## 2021-12-04 ENCOUNTER — Other Ambulatory Visit: Payer: Self-pay | Admitting: Internal Medicine

## 2021-12-06 ENCOUNTER — Inpatient Hospital Stay: Payer: Medicare HMO | Attending: Internal Medicine | Admitting: Internal Medicine

## 2021-12-06 ENCOUNTER — Inpatient Hospital Stay: Payer: Medicare HMO

## 2021-12-06 DIAGNOSIS — Z170 Estrogen receptor positive status [ER+]: Secondary | ICD-10-CM

## 2021-12-06 DIAGNOSIS — Z79811 Long term (current) use of aromatase inhibitors: Secondary | ICD-10-CM | POA: Diagnosis not present

## 2021-12-06 DIAGNOSIS — C50811 Malignant neoplasm of overlapping sites of right female breast: Secondary | ICD-10-CM | POA: Diagnosis present

## 2021-12-06 DIAGNOSIS — Z85118 Personal history of other malignant neoplasm of bronchus and lung: Secondary | ICD-10-CM | POA: Diagnosis not present

## 2021-12-06 DIAGNOSIS — D751 Secondary polycythemia: Secondary | ICD-10-CM | POA: Diagnosis not present

## 2021-12-06 DIAGNOSIS — E876 Hypokalemia: Secondary | ICD-10-CM | POA: Diagnosis not present

## 2021-12-06 LAB — CBC WITH DIFFERENTIAL/PLATELET
Abs Immature Granulocytes: 0.02 10*3/uL (ref 0.00–0.07)
Basophils Absolute: 0 10*3/uL (ref 0.0–0.1)
Basophils Relative: 1 %
Eosinophils Absolute: 0.2 10*3/uL (ref 0.0–0.5)
Eosinophils Relative: 3 %
HCT: 47.2 % — ABNORMAL HIGH (ref 36.0–46.0)
Hemoglobin: 15.1 g/dL — ABNORMAL HIGH (ref 12.0–15.0)
Immature Granulocytes: 0 %
Lymphocytes Relative: 36 %
Lymphs Abs: 2.6 10*3/uL (ref 0.7–4.0)
MCH: 27 pg (ref 26.0–34.0)
MCHC: 32 g/dL (ref 30.0–36.0)
MCV: 84.4 fL (ref 80.0–100.0)
Monocytes Absolute: 0.6 10*3/uL (ref 0.1–1.0)
Monocytes Relative: 9 %
Neutro Abs: 3.7 10*3/uL (ref 1.7–7.7)
Neutrophils Relative %: 51 %
Platelets: 297 10*3/uL (ref 150–400)
RBC: 5.59 MIL/uL — ABNORMAL HIGH (ref 3.87–5.11)
RDW: 14.2 % (ref 11.5–15.5)
WBC: 7.2 10*3/uL (ref 4.0–10.5)
nRBC: 0 % (ref 0.0–0.2)

## 2021-12-06 LAB — BASIC METABOLIC PANEL
Anion gap: 8 (ref 5–15)
BUN: 12 mg/dL (ref 8–23)
CO2: 29 mmol/L (ref 22–32)
Calcium: 9.2 mg/dL (ref 8.9–10.3)
Chloride: 100 mmol/L (ref 98–111)
Creatinine, Ser: 0.76 mg/dL (ref 0.44–1.00)
GFR, Estimated: >60 mL/min (ref >60)
Glucose, Bld: 113 mg/dL — ABNORMAL HIGH (ref 70–99)
Potassium: 3.4 mmol/L — ABNORMAL LOW (ref 3.5–5.1)
Sodium: 137 mmol/L (ref 135–145)

## 2021-12-06 MED ORDER — ANASTROZOLE 1 MG PO TABS: 1.0000 mg | ORAL_TABLET | Freq: Every day | ORAL | 1 refills | Status: DC

## 2021-12-06 NOTE — Progress Notes (Signed)
[facility] NOTE  Patient Care Team: Tracie Harrier, MD as PCP - General (Internal Medicine) Telford Nab, RN as Oncology Nurse Navigator [name], MD as Consulting Physician (Cardiothoracic Surgery) Cammie Sickle, MD as Consulting Physician (Internal Medicine) Nestor Lewandowsky, MD (Inactive) as Referring Physician (Cardiothoracic Surgery) Theodore Demark, RN (Inactive) as Oncology Nurse Navigator  CHIEF COMPLAINTS/PURPOSE OF CONSULTATION: lung cancer/ Breast cancer  #  Oncology History Overview Note  # JAN 2021- ADENOCA [Dr.A; [place] 2021-]DEC 2020- 13 mm lobular somewhat branching right upper lobe lesion is not hypermetabolic.Number of Lymph Nodes Examined: 17 ; Pathologic Stage Classification (pTNM, AJCC 8th Edition): pT1c, pN0 [Dr.Henderickson]; STAGE I-adenocarcinoma; no adjuvant therapy  # Hx of barrets [[facility] -[facility]]  # Hx of garstic bypass [1970s]  SPECIMEN  Procedure: Lumpectomy  Specimen Laterality: Right   TUMOR  Histologic Type: Invasive mammary carcinoma, no special type (ductal)  Histologic Grade (Nottingham Histologic Score)                       Glandular (Acinar)/Tubular Differentiation: 1                       Nuclear Pleomorphism: 2                       Mitotic Rate: 1                       Overall Grade: 1  Tumor Size: 7 x 7 x 5 mm  Tumor Focality: Unifocal  Tumor Extent: Not applicable  Ductal Carcinoma In Situ (DCIS): Not identified  Lymphatic and/or Vascular Invasion: Not identified  Treatment Effect in the Breast: No known presurgical treatment         Malignant neoplasm of right upper lobe of lung ([place])  03/08/2019 Initial Diagnosis   Malignant neoplasm of right upper lobe of lung ([place])   04/03/2019 Cancer Staging   Staging form: Lung, AJCC 8th Edition - Clinical stage from 04/03/2019: ycT1, cN0, cM0 - Signed by [name], MD on 04/03/2019   Carcinoma of overlapping sites of right breast in  female, estrogen receptor positive ([place])  05/25/2021 Initial Diagnosis   Carcinoma of overlapping sites of right breast in female, estrogen receptor positive ([place])   05/25/2021 Cancer Staging   Staging form: Breast, AJCC 8th Edition - Pathologic: Stage IA (pT1b, pN0, cM0, G1, ER+, PR+, HER2-) - Signed by Cammie Sickle, MD on 05/25/2021 Histologic grading system: 3 grade system    HISTORY OF PRESENTING ILLNESS: Ambulating independently.  Alone.  Glenda Gomez 71 y.o.  female with right upper lobe stage I lung cancer ; and stage I ER/PR positive breast cancer-currently on anastrozole is here for follow-up.   Patient her for follow up. Patient states she is not feeling the best this am. Her BP is 168/101. Patient feels this may be why she is not feeling good.   Patient off [place] for about 3 weeks given abdominal complaints.  States abdominal complaints have not resolved she is planned to go back on Ozempic.   Also complains of  back pain-likely due to arthritis in the back.  Difficulty with mobility.  Patient complains hot flashes.  However overall improving.  Denies any worsening joint pains or back pain.  Review of Systems  Constitutional:  Positive for malaise/fatigue. Negative for chills, diaphoresis, fever and weight loss.  HENT:  Negative for nosebleeds and sore throat.   Eyes:  Negative for double vision.  Respiratory:  Negative for hemoptysis, sputum production, shortness of breath and wheezing.   Cardiovascular:  Negative for chest pain, palpitations, orthopnea and leg swelling.  Gastrointestinal:  Negative for abdominal pain, blood in stool, constipation, diarrhea, heartburn, melena, nausea and vomiting.  Genitourinary:  Negative for dysuria, frequency and urgency.  Musculoskeletal:  Positive for back pain and joint pain.  Skin: Negative.  Negative for itching and rash.  Neurological:  Negative for tingling and focal weakness.  Endo/Heme/Allergies:  Does not  bruise/bleed easily.  Psychiatric/Behavioral:  Negative for depression. The patient is not nervous/anxious and does not have insomnia.      MEDICAL HISTORY:  Past Medical History:  Diagnosis Date  . Adenocarcinoma ([place])    right upper lobe  . Arthritis   . Chicken pox   . Dyspnea   . Environmental and seasonal allergies   . Fibromyalgia   . GERD (gastroesophageal reflux disease)   . Hemorrhoid   . Hypersomnia   . Hypertension   . Hypothyroidism   . Lung cancer ([place])   . Malignant neoplasm of right upper lobe of lung ([place]) 03/08/2019  . Myositis ossificans   . Osteoarthritis   . Polycythemia 07/16/2021   dx by Dr. Ginette Pitman  . Thyroid disease     SURGICAL HISTORY: Past Surgical History:  Procedure Laterality Date  .  bengin tongue lesion     Benign 1980's  . ABDOMINAL HYSTERECTOMY  1979  . BACK SURGERY    . bladder tack  2011  . BREAST BIOPSY Left    neg  . BREAST BIOPSY Right 02/22/2021   u/s bc ribbon 8:00 path pending  . BREAST LUMPECTOMY WITH SENTINEL LYMPH NODE BIOPSY Right 03/05/2021   Procedure: BREAST LUMPECTOMY WITH SENTINEL LYMPH NODE BX;  Surgeon: Robert Bellow, MD;  Location: [facility] ORS;  Service: General;  Laterality: Right;  . CHOLECYSTECTOMY  2010  . COLONOSCOPY  12/22/2011  . COLONOSCOPY WITH PROPOFOL N/A 03/24/2017   Procedure: COLONOSCOPY WITH PROPOFOL;  Surgeon: Lollie Sails, MD;  Location: [facility] ENDOSCOPY;  Service: Endoscopy;  Laterality: N/A;  . COLONOSCOPY WITH PROPOFOL N/A 07/22/2020   Procedure: COLONOSCOPY WITH PROPOFOL;  Surgeon: Toledo, Benay Pike, MD;  Location: [facility] ENDOSCOPY;  Service: Gastroenterology;  Laterality: N/A;  . ESOPHAGOGASTRODUODENOSCOPY (EGD) WITH PROPOFOL N/A 03/24/2017   Procedure: ESOPHAGOGASTRODUODENOSCOPY (EGD) WITH PROPOFOL;  Surgeon: Lollie Sails, MD;  Location: [facility] ENDOSCOPY;  Service: Endoscopy;  Laterality: N/A;  . ESOPHAGOGASTRODUODENOSCOPY (EGD) WITH PROPOFOL N/A 07/22/2020   Procedure:  ESOPHAGOGASTRODUODENOSCOPY (EGD) WITH PROPOFOL;  Surgeon: Toledo, Benay Pike, MD;  Location: [facility] ENDOSCOPY;  Service: Gastroenterology;  Laterality: N/A;  . GASTRIC BYPASS OPEN     5277'O complicated by herniated stomach requiring reversal with mesh  . HEMORRHOID SURGERY    . INTERCOSTAL NERVE BLOCK Right 03/29/2019   Procedure: Intercostal Nerve Block;  Surgeon: [name], MD;  Location: [facility];  Service: Thoracic;  Laterality: Right;  . JOINT REPLACEMENT Bilateral    TKR  . KNEE ARTHROPLASTY Right 07/06/2016   Procedure: COMPUTER ASSISTED TOTAL KNEE ARTHROPLASTY;  Surgeon: Dereck Leep, MD;  Location: [facility] ORS;  Service: Orthopedics;  Laterality: Right;  . KNEE ARTHROSCOPY Left 10/19/2006   x 3  . KNEE ARTHROSCOPY Right    x2  . LOBECTOMY Right 03/29/2019   XI ROBOTIC ASSISTED THORASCOPY- RIGHT UPPER LOBECTOMY (Right Chest)  . NODE DISSECTION  03/29/2019   Procedure:  Node Dissection;  Surgeon: [name], MD;  Location: [facility];  Service: Thoracic;;  . plantar fascia Left   . PLANTAR FASCIA RELEASE Right    x 3  . POSTERIOR LAMINECTOMY / DECOMPRESSION LUMBAR SPINE  06/24/2015  . SHOULDER ARTHROSCOPY WITH DEBRIDEMENT AND BICEP TENDON REPAIR Left 09/25/2018   Procedure: SHOULDER ARTHROSCOPY WITH DEBRIDEMENT DECOMPRESSION AND BICEP TENDON REPAIR - SCRUB TECH;  Surgeon: Corky Mull, MD;  Location: [facility] ORS;  Service: Orthopedics;  Laterality: Left;  . TONSILLECTOMY     childhood  . TOTAL KNEE ARTHROPLASTY Left 11/17/2004  . VIDEO BRONCHOSCOPY WITH ENDOBRONCHIAL NAVIGATION N/A 03/04/2019   Procedure: VIDEO BRONCHOSCOPY WITH ENDOBRONCHIAL NAVIGATION;  Surgeon: Ottie Glazier, MD;  Location: [facility] ORS;  Service: Thoracic;  Laterality: N/A;  . VIDEO BRONCHOSCOPY WITH ENDOBRONCHIAL ULTRASOUND N/A 03/04/2019   Procedure: VIDEO BRONCHOSCOPY WITH ENDOBRONCHIAL ULTRASOUND;  Surgeon: Ottie Glazier, MD;  Location: [facility] ORS;  Service: Thoracic;  Laterality: N/A;     SOCIAL HISTORY: Social History   Socioeconomic History  . Marital status: Divorced    Spouse name: Not on file  . Number of children: Not on file  . Years of education: Not on file  . Highest education level: Not on file  Occupational History  . Not on file  Tobacco Use  . Smoking status: Former    Packs/day: 2.00    Years: 10.00    Total pack years: 20.00    Types: Cigarettes    Quit date: 07/17/1993    Years since quitting: 28.4  . Smokeless tobacco: Never  Vaping Use  . Vaping Use: Never used  Substance and Sexual Activity  . Alcohol use: No  . Drug use: No  . Sexual activity: Not on file  Other Topics Concern  . Not on file  Social History Narrative   1ppd- quit > 20 years ago; no alcohol; in [place]; lives self. retd- cook.    Social Determinants of Health   Financial Resource Strain: Not on file  Food Insecurity: Not on file  Transportation Needs: Not on file  Physical Activity: Not on file  Stress: Not on file  Social Connections: Not on file  Intimate Partner Violence: Not on file    FAMILY HISTORY: Family History  Problem Relation Age of Onset  . Breast cancer Sister 49  . Breast cancer Maternal Aunt 80  . Lung cancer Maternal Aunt   . Breast cancer Sister 51  . Alzheimer's disease Mother   . Arthritis Mother   . Diabetes Mother   . Heart failure Brother   . Pancreatic cancer Brother 62  . Lung cancer Brother   . Liver cancer Father     ALLERGIES:  is allergic to flagyl [metronidazole], phentermine, and pregabalin.  MEDICATIONS:  Current Outpatient Medications  Medication Sig Dispense Refill  . ascorbic acid (VITAMIN C) 1000 MG tablet Take 1,000 mg by mouth daily.    . celecoxib (CELEBREX) 200 MG capsule Take 1 capsule (200 mg total) by mouth 2 (two) times daily with a meal. 60 capsule 1  . cyclobenzaprine (FLEXERIL) 10 MG tablet Take 1 tablet (10 mg total) by mouth 3 (three) times daily as needed for muscle spasms. 90 tablet 0  .  furosemide (LASIX) 20 MG tablet Take 20 mg by mouth daily as needed for fluid or edema.    Marland Kitchen levothyroxine (SYNTHROID) 88 MCG tablet Take 88 mcg by mouth daily before breakfast.    . omeprazole (PRILOSEC) 20 MG capsule Take 20  mg by mouth 2 (two) times daily before a meal.     . oxyCODONE-acetaminophen (PERCOCET/ROXICET) 5-325 MG tablet Take 1 tablet by mouth every 8 (eight) hours as needed.    . Semaglutide (OZEMPIC, 0.25 OR 0.5 MG/DOSE, [place]) Inject 0.25 mg into the skin every Sunday.    . triamterene-hydrochlorothiazide (MAXZIDE-25) 37.5-25 MG tablet Take 1 tablet by mouth daily.    . Vitamin D, Ergocalciferol, (DRISDOL) 1.25 MG (50000 UNIT) CAPS capsule Take 50,000 Units by mouth once a week. Friday    . zolpidem (AMBIEN) 5 MG tablet Take 5-7.5 mg by mouth at bedtime.    Marland Kitchen anastrozole (ARIMIDEX) 1 MG tablet Take 1 tablet (1 mg total) by mouth daily. 90 tablet 1   No current facility-administered medications for this visit.      Marland Kitchen  PHYSICAL EXAMINATION: ECOG PERFORMANCE STATUS: 0 - Asymptomatic  Vitals:   12/06/21 1003  BP: (!) 168/101  Pulse: 65  Temp: 98.9 F (37.2 C)  SpO2: 99%   Filed Weights   12/06/21 1003  Weight: 242 lb (109.8 kg)    Physical Exam Constitutional:      Comments: Obese. She is walking herself.  HENT:     Head: Normocephalic and atraumatic.     Mouth/Throat:     Pharynx: No oropharyngeal exudate.  Eyes:     Pupils: Pupils are equal, round, and reactive to light.  Cardiovascular:     Rate and Rhythm: Normal rate and regular rhythm.  Pulmonary:     Effort: Pulmonary effort is normal. No respiratory distress.     Breath sounds: Normal breath sounds. No wheezing.  Abdominal:     General: Bowel sounds are normal. There is no distension.     Palpations: Abdomen is soft. There is no mass.     Tenderness: There is no abdominal tenderness. There is no guarding or rebound.  Musculoskeletal:        General: No tenderness. Normal range of motion.      Cervical back: Normal range of motion and neck supple.  Skin:    General: Skin is warm.  Neurological:     Mental Status: She is alert and oriented to person, place, and time.  Psychiatric:        Mood and Affect: Affect normal.   LABORATORY DATA:  I have reviewed the data as listed Lab Results  Component Value Date   WBC 7.2 12/06/2021   HGB 15.1 (H) 12/06/2021   HCT 47.2 (H) 12/06/2021   MCV 84.4 12/06/2021   PLT 297 12/06/2021   Recent Labs    02/01/21 1432 03/04/21 1100 08/04/21 1001 12/06/21 0944  NA 136  --  137 137  K 3.3* 3.2* 3.7 3.4*  CL 99  --  101 100  CO2 28  --  27 29  GLUCOSE 110*  --  100* 113*  BUN 17  --  13 12  CREATININE 0.85  --  0.88 0.76  CALCIUM 8.9  --  9.0 9.2  GFRNONAA >60  --  >60 >60  PROT 7.3  --  7.1  --   ALBUMIN 3.9  --  3.8  --   AST 24  --  25  --   ALT 13  --  13  --   ALKPHOS 73  --  76  --   BILITOT 0.5  --  0.7  --     RADIOGRAPHIC STUDIES: I have personally reviewed the radiological images as listed and agreed with  the findings in the report. No results found.   ASSESSMENT & PLAN:   Carcinoma of overlapping sites of right breast in female, estrogen receptor positive ([place]) #Right breast invasive mammary carcinoma -PT1BN0 ER/PR positive HER2 negative- INVASIVE MAMMARY CARCINOMA, NO SPECIAL TYPE; grade 1; given low risk-okay to hold off Oncotype.  Would not recommend adjuvant chemotherapy; DECLINED Radiation.  Stable  # Continue anastrozole at this time.  Patient tolerating fairly well except for hot flashes-not any worse. STABLE.   # Hot flashes grade 1--second anastrozole. STABLE.   # Erythrocytosis-likely secondary- HCT- 16 [MAY 2023; PCP]; JUNE 2023- 15.1 [>30 y- ex-smoker ] JAK-2 -pending.   # RIGHT UPPER LOBE LUNG CA-stage I-status post resection clear margins-JUNE 2023--stable exam no evidence of any progressive disease/recurrent disease.     # screening for Osteoporosis: The BMD measured at Femur Neck -T-score of  0.0. on Vit D; add calcium.   # Hot flashes- G 1-2 sec to Anastrazole. Monitor for now.   # Poorly controlled BP- 168/101- even at home in 200s- call and make appt ASAP. Lasix prn; on Maxzide.   # mild hypokalemia: K 3.4-likely secondary to triamterene hydrochlorothiazide.  Recommend increased dietary intake of potassium rich foods.  # Tinnitus- [2 years; ENT]-ongoing headaches.  Defer to PCP/ENT    # Obesity: Ozmepic- lost weight 25 pounds; cannot exercise sec to back pain.   # DISPOSITION: # follow up in 4  months-  MD;labs- cbc/bmp; Dr.B    All questions were answered. The patient knows to call the clinic with any problems, questions or concerns.    Cammie Sickle, MD 12/06/2021 11:20 AM

## 2021-12-06 NOTE — Progress Notes (Signed)
Patient her for follow up. Patient states she is not feeling the best this am. Her BP is 168/101. Patient feels this may be why she is not feeling good.

## 2021-12-06 NOTE — Assessment & Plan Note (Addendum)
#  Right breast invasive mammary carcinoma -PT1BN0 ER/PR positive HER2 negative- INVASIVE MAMMARY CARCINOMA, NO SPECIAL TYPE; grade 1; given low risk-okay to hold off Oncotype.  Would not recommend adjuvant chemotherapy; DECLINED Radiation.  Stable  # Continue anastrozole at this time.  Patient tolerating fairly well except for hot flashes-not any worse. STABLE.   # Hot flashes grade 1--second anastrozole. STABLE.   # Erythrocytosis-likely secondary- HCT- 16 [MAY 2023; PCP]; JUNE 2023- 15.1 [>30 y- ex-smoker ] JAK-2 -pending.   # RIGHT UPPER LOBE LUNG CA-stage I-status post resection clear margins-JUNE 2023--stable exam no evidence of any progressive disease/recurrent disease.     # screening for Osteoporosis: The BMD measured at Femur Neck -T-score of 0.0. on Vit D; add calcium.   # Hot flashes- G 1-2 sec to Anastrazole. Monitor for now.   # Poorly controlled BP- 168/101- even at home in 200s- call and make appt ASAP. Lasix prn; on Maxzide.   # mild hypokalemia: K 3.4-likely secondary to triamterene hydrochlorothiazide.  Recommend increased dietary intake of potassium rich foods.  # Tinnitus- [2 years; ENT]-ongoing headaches.  Defer to PCP/ENT  # Obesity: Ozmepic- lost weight 25 pounds; cannot exercise sec to back pain.   # DISPOSITION: # follow up in 4  months-  MD;labs- cbc/bmp; Dr.B

## 2021-12-10 ENCOUNTER — Encounter: Payer: Self-pay | Admitting: Internal Medicine

## 2021-12-12 LAB — JAK2 GENOTYPR

## 2021-12-15 ENCOUNTER — Telehealth: Payer: Self-pay | Admitting: *Deleted

## 2021-12-15 NOTE — Telephone Encounter (Signed)
-----   Message from Cammie Sickle, MD sent at 12/15/2021  2:39 PM EDT ----- Please inform patient that her JAK2 testing is negative; and no concerns for cancer make the hemoglobin slightly high.  Slightly high hemoglobin -could be from her history of smoking/obesity/diuretics.  No other work-up or any new recommendations. Follow-up as planned thank you GB

## 2021-12-15 NOTE — Telephone Encounter (Signed)
Call placed to patient to review results. Patient verbalized understanding and denies any further questions.

## 2021-12-20 ENCOUNTER — Other Ambulatory Visit: Payer: Self-pay | Admitting: General Surgery

## 2021-12-20 DIAGNOSIS — C3411 Malignant neoplasm of upper lobe, right bronchus or lung: Secondary | ICD-10-CM

## 2021-12-20 NOTE — Progress Notes (Unsigned)
   Telephone Visit- Progress Note: Referring Physician:  Tracie Harrier, MD [address] [facility] [place],  [place] [address]  Primary Physician:  Tracie Harrier, MD  This visit was performed via telephone.  Patient location: home Provider location: office  I spent a total of 20 minutes non-face-to-face activities for this visit on the date of this encounter including review of current clinical condition and response to treatment.    Chief Complaint:  f/u of lumbar/cervical PT  History of Present Illness: Glenda Gomez is a 71 y.o. female who last did a phone visit with me on 11/02/21. She has known cervical spondylosiswith mild-to-moderate left-sided neural foraminal stenosis at C3-C4 and moderate to severe left-sided neural foraminal stenosis at C5-C6.    Also with known retrolisthesis L1-L2, slip L4-L5, diffuse spondylosis and DDD with vacuum disc at L5-S1.    She has known diagnosis of myositis and FM which complicates her clinical picture. Neck pain may be more myofascial in nature. LBP likely from facets and DDD.   She was sent to PT at her last visit. We discussed injections and she wanted to hold off. She was to continue on celebrex and prn flexeril.   She has been doing PT at [facility] and they have only been having her "walk in the pool." They told her they wanted to try this first and then transition her to land PT. She is getting frustrated and she wants to be doing more. She fee  Dr. Lanney Gins (pulmonary) referred her to PMR at [facility]- she has an appointment tomorrow to discuss possible injections.   She continues on celebrex and prn flexeril.    Exam: No exam done as this was a telephone encounter.     Imaging: No new imaging to review.   I have personally reviewed the images and agree with the above interpretation.  Assessment and Plan: Glenda Gomez is a pleasant 71 y.o. female with persistent scapular pain along with LBP that radiates into her  hips and legs to her knees.   She has known cervical spondylosiswith mild-to-moderate left-sided neural foraminal stenosis at C3-C4 and moderate to severe left-sided neural foraminal stenosis at C5-C6.    Also with known retrolisthesis L1-L2, slip L4-L5, diffuse spondylosis and DDD with vacuum disc at L5-S1.   In addition, she has known diagnosis of myositis and FM which complicates her clinical picture. Neck pain may be more myofascial in nature. LBP likely from facets and DDD.   Treatment options discussed with patient and following plan made:   - Continue PT for now. She will talk to them about possibly starting land PT.  - Agree with referral to PMR for possible injections, she has an appointment tomorrow.  - Continue on celebrex and prn flexeril (she takes this rarely).  - Dr. Lanney Gins (pulmonary) set her up to see PMR. She would like to follow up with him and her PCP for now. She wants to see less providers.  - Will message her in MyChart after the first of the year to check on her. She will let me know if she needs anything prior to this.   Geronimo Boot PA-C Neurosurgery

## 2021-12-21 ENCOUNTER — Ambulatory Visit (INDEPENDENT_AMBULATORY_CARE_PROVIDER_SITE_OTHER): Payer: Medicare HMO | Admitting: Orthopedic Surgery

## 2021-12-21 ENCOUNTER — Encounter: Payer: Self-pay | Admitting: Orthopedic Surgery

## 2021-12-21 DIAGNOSIS — M47812 Spondylosis without myelopathy or radiculopathy, cervical region: Secondary | ICD-10-CM | POA: Diagnosis not present

## 2021-12-21 DIAGNOSIS — M5136 Other intervertebral disc degeneration, lumbar region: Secondary | ICD-10-CM | POA: Diagnosis not present

## 2021-12-21 DIAGNOSIS — M4316 Spondylolisthesis, lumbar region: Secondary | ICD-10-CM

## 2022-01-13 ENCOUNTER — Other Ambulatory Visit: Payer: Self-pay | Admitting: General Surgery

## 2022-01-13 DIAGNOSIS — C3411 Malignant neoplasm of upper lobe, right bronchus or lung: Secondary | ICD-10-CM

## 2022-02-03 ENCOUNTER — Ambulatory Visit
Admission: RE | Admit: 2022-02-03 | Discharge: 2022-02-03 | Disposition: A | Discharge status: Home / Self Care | Payer: Medicare HMO | Source: Ambulatory Visit | Attending: General Surgery | Admitting: General Surgery

## 2022-02-03 DIAGNOSIS — C3411 Malignant neoplasm of upper lobe, right bronchus or lung: Secondary | ICD-10-CM | POA: Insufficient documentation

## 2022-02-03 DIAGNOSIS — Z853 Personal history of malignant neoplasm of breast: Secondary | ICD-10-CM | POA: Insufficient documentation

## 2022-03-08 ENCOUNTER — Ambulatory Visit: Admission: RE | Admit: 2022-03-08 | Payer: Medicare HMO | Source: Ambulatory Visit | Admitting: Radiation Oncology

## 2022-03-09 ENCOUNTER — Ambulatory Visit
Admission: RE | Admit: 2022-03-09 | Discharge: 2022-03-09 | Disposition: A | Discharge status: Home / Self Care | Payer: Medicare HMO | Source: Ambulatory Visit | Attending: Radiation Oncology | Admitting: Radiation Oncology

## 2022-03-09 DIAGNOSIS — Z170 Estrogen receptor positive status [ER+]: Secondary | ICD-10-CM

## 2022-03-09 DIAGNOSIS — C50511 Malignant neoplasm of lower-outer quadrant of right female breast: Secondary | ICD-10-CM | POA: Diagnosis present

## 2022-03-10 NOTE — Progress Notes (Signed)
Radiation Oncology Follow up Note  Name: Glenda Gomez   Date:   03/09/2022 MRN:  [id number] DOB: 1950/03/16    This 72 y.o. female presents to the clinic today for reevaluation of stage Ia (pT1b pN0 M0) ER/PR positive HER2 negative invasive mammary carcinoma the right breast status post wide local excision and sentinel node biopsy.Marland Kitchen  REFERRING PROVIDER: Tracie Harrier, MD  HPI: Patient is a 72 year old female originally consulted back in February 23 for stage Ia ER/PR positive invasive mammary carcinoma the right breast status post wide local excision and sentinel node biopsy.  For some reason she declined radiation therapy after speaking to "a nurse she does not really remember and was not satisfied with the side effects the nurse described to her.  Over time she has spoken to both Dr. Tollie Pizza and Dr B and now is interested in whole breast radiation.  She is doing well she is without complaint she specifically denies breast tenderness cough or bone pain..  COMPLICATIONS OF TREATMENT: none  FOLLOW UP COMPLIANCE: keeps appointments   PHYSICAL EXAM:  There were no vitals taken for this visit. Lungs are clear to A&P cardiac examination essentially unremarkable with regular rate and rhythm. No dominant mass or nodularity is noted in either breast in 2 positions examined. Incision is well-healed. No axillary or supraclavicular adenopathy is appreciated. Cosmetic result is excellent.  Well-developed well-nourished patient in NAD. HEENT reveals PERLA, EOMI, discs not visualized.  Oral cavity is clear. No oral mucosal lesions are identified. Neck is clear without evidence of cervical or supraclavicular adenopathy. Lungs are clear to A&P. Cardiac examination is essentially unremarkable with regular rate and rhythm without murmur rub or thrill. Abdomen is benign with no organomegaly or masses noted. Motor sensory and DTR levels are equal and symmetric in the upper and lower extremities. Cranial nerves II  through XII are grossly intact. Proprioception is intact. No peripheral adenopathy or edema is identified. No motor or sensory levels are noted. Crude visual fields are within normal range.  RADIOLOGY RESULTS: Mammograms reviewed  PLAN: At this time we will go ahead with original plan of treating hypofractionated course of whole breast radiation over 3 weeks boosting her scar another 1000 centigrade.  Risks and benefits of treatment including skin reaction fatigue alteration of blood counts possible inclusion of superficial lung all were reviewed in detail with the patient.  She comprehends my treatment plan well.  I have personally set up and ordered CT simulation for next week.  I would like to take this opportunity to thank you for allowing me to participate in the care of your patient.Noreene Filbert, MD

## 2022-03-14 ENCOUNTER — Ambulatory Visit
Admission: RE | Admit: 2022-03-14 | Discharge: 2022-03-14 | Disposition: A | Discharge status: Home / Self Care | Payer: Medicare HMO | Source: Ambulatory Visit | Attending: Radiation Oncology | Admitting: Radiation Oncology

## 2022-03-14 DIAGNOSIS — C50511 Malignant neoplasm of lower-outer quadrant of right female breast: Secondary | ICD-10-CM | POA: Diagnosis not present

## 2022-03-15 DIAGNOSIS — C50511 Malignant neoplasm of lower-outer quadrant of right female breast: Secondary | ICD-10-CM | POA: Diagnosis not present

## 2022-03-17 ENCOUNTER — Encounter: Payer: Self-pay | Admitting: Internal Medicine

## 2022-03-17 ENCOUNTER — Other Ambulatory Visit: Payer: Self-pay | Admitting: *Deleted

## 2022-03-17 DIAGNOSIS — C50511 Malignant neoplasm of lower-outer quadrant of right female breast: Secondary | ICD-10-CM

## 2022-03-21 ENCOUNTER — Ambulatory Visit: Admission: RE | Admit: 2022-03-21 | Payer: Medicare HMO | Source: Ambulatory Visit

## 2022-03-21 DIAGNOSIS — C50511 Malignant neoplasm of lower-outer quadrant of right female breast: Secondary | ICD-10-CM | POA: Insufficient documentation

## 2022-03-22 ENCOUNTER — Other Ambulatory Visit: Payer: Self-pay

## 2022-03-22 ENCOUNTER — Ambulatory Visit
Admission: RE | Admit: 2022-03-22 | Discharge: 2022-03-22 | Disposition: A | Discharge status: Home / Self Care | Payer: Medicare HMO | Source: Ambulatory Visit | Attending: Radiation Oncology | Admitting: Radiation Oncology

## 2022-03-22 DIAGNOSIS — C50511 Malignant neoplasm of lower-outer quadrant of right female breast: Secondary | ICD-10-CM | POA: Diagnosis not present

## 2022-03-22 LAB — RAD ONC ARIA SESSION SUMMARY
Course Elapsed Days: 0
Plan Fractions Treated to Date: 1
Plan Prescribed Dose Per Fraction: 2.66 Gy
Plan Total Fractions Prescribed: 16
Plan Total Prescribed Dose: 42.56 Gy
Reference Point Dosage Given to Date: 2.66 Gy
Reference Point Session Dosage Given: 2.66 Gy
Session Number: 1

## 2022-03-23 ENCOUNTER — Ambulatory Visit
Admission: RE | Admit: 2022-03-23 | Discharge: 2022-03-23 | Disposition: A | Discharge status: Home / Self Care | Payer: Medicare HMO | Source: Ambulatory Visit | Attending: Radiation Oncology | Admitting: Radiation Oncology

## 2022-03-23 ENCOUNTER — Other Ambulatory Visit: Payer: Self-pay

## 2022-03-23 DIAGNOSIS — C50511 Malignant neoplasm of lower-outer quadrant of right female breast: Secondary | ICD-10-CM | POA: Diagnosis not present

## 2022-03-23 LAB — RAD ONC ARIA SESSION SUMMARY
Course Elapsed Days: 1
Plan Fractions Treated to Date: 2
Plan Prescribed Dose Per Fraction: 2.66 Gy
Plan Total Fractions Prescribed: 16
Plan Total Prescribed Dose: 42.56 Gy
Reference Point Dosage Given to Date: 5.32 Gy
Reference Point Session Dosage Given: 2.66 Gy
Session Number: 2

## 2022-03-24 ENCOUNTER — Ambulatory Visit
Admission: RE | Admit: 2022-03-24 | Discharge: 2022-03-24 | Disposition: A | Discharge status: Home / Self Care | Payer: Medicare HMO | Source: Ambulatory Visit | Attending: Radiation Oncology | Admitting: Radiation Oncology

## 2022-03-24 ENCOUNTER — Other Ambulatory Visit: Payer: Self-pay

## 2022-03-24 DIAGNOSIS — C50511 Malignant neoplasm of lower-outer quadrant of right female breast: Secondary | ICD-10-CM | POA: Diagnosis not present

## 2022-03-24 LAB — RAD ONC ARIA SESSION SUMMARY
Course Elapsed Days: 2
Plan Fractions Treated to Date: 3
Plan Prescribed Dose Per Fraction: 2.66 Gy
Plan Total Fractions Prescribed: 16
Plan Total Prescribed Dose: 42.56 Gy
Reference Point Dosage Given to Date: 7.98 Gy
Reference Point Session Dosage Given: 2.66 Gy
Session Number: 3

## 2022-03-25 ENCOUNTER — Ambulatory Visit
Admission: RE | Admit: 2022-03-25 | Discharge: 2022-03-25 | Disposition: A | Discharge status: Home / Self Care | Payer: Medicare HMO | Source: Ambulatory Visit | Attending: Radiation Oncology | Admitting: Radiation Oncology

## 2022-03-25 ENCOUNTER — Other Ambulatory Visit: Payer: Self-pay

## 2022-03-25 DIAGNOSIS — C50511 Malignant neoplasm of lower-outer quadrant of right female breast: Secondary | ICD-10-CM | POA: Diagnosis not present

## 2022-03-25 LAB — RAD ONC ARIA SESSION SUMMARY
Course Elapsed Days: 3
Plan Fractions Treated to Date: 4
Plan Prescribed Dose Per Fraction: 2.66 Gy
Plan Total Fractions Prescribed: 16
Plan Total Prescribed Dose: 42.56 Gy
Reference Point Dosage Given to Date: 10.64 Gy
Reference Point Session Dosage Given: 2.66 Gy
Session Number: 4

## 2022-03-28 ENCOUNTER — Other Ambulatory Visit: Payer: Self-pay

## 2022-03-28 ENCOUNTER — Ambulatory Visit
Admission: RE | Admit: 2022-03-28 | Discharge: 2022-03-28 | Disposition: A | Discharge status: Home / Self Care | Payer: Medicare HMO | Source: Ambulatory Visit | Attending: Radiation Oncology | Admitting: Radiation Oncology

## 2022-03-28 DIAGNOSIS — C50511 Malignant neoplasm of lower-outer quadrant of right female breast: Secondary | ICD-10-CM | POA: Diagnosis not present

## 2022-03-28 LAB — RAD ONC ARIA SESSION SUMMARY
Course Elapsed Days: 6
Plan Fractions Treated to Date: 5
Plan Prescribed Dose Per Fraction: 2.66 Gy
Plan Total Fractions Prescribed: 16
Plan Total Prescribed Dose: 42.56 Gy
Reference Point Dosage Given to Date: 13.3 Gy
Reference Point Session Dosage Given: 2.66 Gy
Session Number: 5

## 2022-03-29 ENCOUNTER — Ambulatory Visit
Admission: RE | Admit: 2022-03-29 | Discharge: 2022-03-29 | Disposition: A | Discharge status: Home / Self Care | Payer: Medicare HMO | Source: Ambulatory Visit | Attending: Radiation Oncology | Admitting: Radiation Oncology

## 2022-03-29 ENCOUNTER — Other Ambulatory Visit: Payer: Self-pay

## 2022-03-29 ENCOUNTER — Inpatient Hospital Stay: Payer: Medicare HMO | Attending: Internal Medicine

## 2022-03-29 DIAGNOSIS — E871 Hypo-osmolality and hyponatremia: Secondary | ICD-10-CM | POA: Insufficient documentation

## 2022-03-29 DIAGNOSIS — D751 Secondary polycythemia: Secondary | ICD-10-CM | POA: Insufficient documentation

## 2022-03-29 DIAGNOSIS — Z170 Estrogen receptor positive status [ER+]: Secondary | ICD-10-CM | POA: Insufficient documentation

## 2022-03-29 DIAGNOSIS — C50811 Malignant neoplasm of overlapping sites of right female breast: Secondary | ICD-10-CM | POA: Insufficient documentation

## 2022-03-29 DIAGNOSIS — C50511 Malignant neoplasm of lower-outer quadrant of right female breast: Secondary | ICD-10-CM | POA: Diagnosis not present

## 2022-03-29 DIAGNOSIS — Z85118 Personal history of other malignant neoplasm of bronchus and lung: Secondary | ICD-10-CM | POA: Insufficient documentation

## 2022-03-29 DIAGNOSIS — I10 Essential (primary) hypertension: Secondary | ICD-10-CM | POA: Insufficient documentation

## 2022-03-29 DIAGNOSIS — Z79811 Long term (current) use of aromatase inhibitors: Secondary | ICD-10-CM | POA: Insufficient documentation

## 2022-03-29 LAB — RAD ONC ARIA SESSION SUMMARY
Course Elapsed Days: 7
Plan Fractions Treated to Date: 6
Plan Prescribed Dose Per Fraction: 2.66 Gy
Plan Total Fractions Prescribed: 16
Plan Total Prescribed Dose: 42.56 Gy
Reference Point Dosage Given to Date: 15.96 Gy
Reference Point Session Dosage Given: 2.66 Gy
Session Number: 6

## 2022-03-30 ENCOUNTER — Other Ambulatory Visit: Payer: Self-pay

## 2022-03-30 ENCOUNTER — Ambulatory Visit
Admission: RE | Admit: 2022-03-30 | Discharge: 2022-03-30 | Disposition: A | Discharge status: Home / Self Care | Payer: Medicare HMO | Source: Ambulatory Visit | Attending: Radiation Oncology | Admitting: Radiation Oncology

## 2022-03-30 DIAGNOSIS — C50511 Malignant neoplasm of lower-outer quadrant of right female breast: Secondary | ICD-10-CM | POA: Diagnosis not present

## 2022-03-30 LAB — RAD ONC ARIA SESSION SUMMARY
Course Elapsed Days: 8
Plan Fractions Treated to Date: 7
Plan Prescribed Dose Per Fraction: 2.66 Gy
Plan Total Fractions Prescribed: 16
Plan Total Prescribed Dose: 42.56 Gy
Reference Point Dosage Given to Date: 18.62 Gy
Reference Point Session Dosage Given: 2.66 Gy
Session Number: 7

## 2022-03-31 ENCOUNTER — Other Ambulatory Visit: Payer: Self-pay

## 2022-03-31 ENCOUNTER — Ambulatory Visit
Admission: RE | Admit: 2022-03-31 | Discharge: 2022-03-31 | Disposition: A | Discharge status: Home / Self Care | Payer: Medicare HMO | Source: Ambulatory Visit | Attending: Radiation Oncology | Admitting: Radiation Oncology

## 2022-03-31 DIAGNOSIS — C50511 Malignant neoplasm of lower-outer quadrant of right female breast: Secondary | ICD-10-CM | POA: Diagnosis not present

## 2022-03-31 LAB — RAD ONC ARIA SESSION SUMMARY
Course Elapsed Days: 9
Plan Fractions Treated to Date: 8
Plan Prescribed Dose Per Fraction: 2.66 Gy
Plan Total Fractions Prescribed: 16
Plan Total Prescribed Dose: 42.56 Gy
Reference Point Dosage Given to Date: 21.28 Gy
Reference Point Session Dosage Given: 2.66 Gy
Session Number: 8

## 2022-04-01 ENCOUNTER — Other Ambulatory Visit: Payer: Self-pay

## 2022-04-01 ENCOUNTER — Ambulatory Visit
Admission: RE | Admit: 2022-04-01 | Discharge: 2022-04-01 | Disposition: A | Discharge status: Home / Self Care | Payer: Medicare HMO | Source: Ambulatory Visit | Attending: Radiation Oncology | Admitting: Radiation Oncology

## 2022-04-01 DIAGNOSIS — C50511 Malignant neoplasm of lower-outer quadrant of right female breast: Secondary | ICD-10-CM | POA: Diagnosis not present

## 2022-04-01 LAB — RAD ONC ARIA SESSION SUMMARY
Course Elapsed Days: 10
Plan Fractions Treated to Date: 9
Plan Prescribed Dose Per Fraction: 2.66 Gy
Plan Total Fractions Prescribed: 16
Plan Total Prescribed Dose: 42.56 Gy
Reference Point Dosage Given to Date: 23.94 Gy
Reference Point Session Dosage Given: 2.66 Gy
Session Number: 9

## 2022-04-04 ENCOUNTER — Other Ambulatory Visit: Payer: Self-pay

## 2022-04-04 ENCOUNTER — Ambulatory Visit
Admission: RE | Admit: 2022-04-04 | Discharge: 2022-04-04 | Disposition: A | Discharge status: Home / Self Care | Payer: Medicare HMO | Source: Ambulatory Visit | Attending: Radiation Oncology | Admitting: Radiation Oncology

## 2022-04-04 DIAGNOSIS — C50511 Malignant neoplasm of lower-outer quadrant of right female breast: Secondary | ICD-10-CM | POA: Diagnosis not present

## 2022-04-04 LAB — RAD ONC ARIA SESSION SUMMARY
Course Elapsed Days: 13
Plan Fractions Treated to Date: 10
Plan Prescribed Dose Per Fraction: 2.66 Gy
Plan Total Fractions Prescribed: 16
Plan Total Prescribed Dose: 42.56 Gy
Reference Point Dosage Given to Date: 26.6 Gy
Reference Point Session Dosage Given: 2.66 Gy
Session Number: 10

## 2022-04-05 ENCOUNTER — Ambulatory Visit
Admission: RE | Admit: 2022-04-05 | Discharge: 2022-04-05 | Disposition: A | Discharge status: Home / Self Care | Payer: Medicare HMO | Source: Ambulatory Visit | Attending: Radiation Oncology | Admitting: Radiation Oncology

## 2022-04-05 ENCOUNTER — Other Ambulatory Visit: Payer: Self-pay

## 2022-04-05 DIAGNOSIS — C50511 Malignant neoplasm of lower-outer quadrant of right female breast: Secondary | ICD-10-CM | POA: Diagnosis not present

## 2022-04-05 LAB — RAD ONC ARIA SESSION SUMMARY
Course Elapsed Days: 14
Plan Fractions Treated to Date: 11
Plan Prescribed Dose Per Fraction: 2.66 Gy
Plan Total Fractions Prescribed: 16
Plan Total Prescribed Dose: 42.56 Gy
Reference Point Dosage Given to Date: 29.26 Gy
Reference Point Session Dosage Given: 2.66 Gy
Session Number: 11

## 2022-04-06 ENCOUNTER — Ambulatory Visit
Admission: RE | Admit: 2022-04-06 | Discharge: 2022-04-06 | Disposition: A | Discharge status: Home / Self Care | Payer: Medicare HMO | Source: Ambulatory Visit | Attending: Radiation Oncology | Admitting: Radiation Oncology

## 2022-04-06 ENCOUNTER — Other Ambulatory Visit: Payer: Self-pay

## 2022-04-06 DIAGNOSIS — C50511 Malignant neoplasm of lower-outer quadrant of right female breast: Secondary | ICD-10-CM | POA: Diagnosis not present

## 2022-04-06 LAB — RAD ONC ARIA SESSION SUMMARY
Course Elapsed Days: 15
Plan Fractions Treated to Date: 12
Plan Prescribed Dose Per Fraction: 2.66 Gy
Plan Total Fractions Prescribed: 16
Plan Total Prescribed Dose: 42.56 Gy
Reference Point Dosage Given to Date: 31.92 Gy
Reference Point Session Dosage Given: 2.66 Gy
Session Number: 12

## 2022-04-07 ENCOUNTER — Other Ambulatory Visit: Payer: Self-pay

## 2022-04-07 ENCOUNTER — Ambulatory Visit
Admission: RE | Admit: 2022-04-07 | Discharge: 2022-04-07 | Disposition: A | Discharge status: Home / Self Care | Payer: Medicare HMO | Source: Ambulatory Visit | Attending: Radiation Oncology | Admitting: Radiation Oncology

## 2022-04-07 DIAGNOSIS — C50511 Malignant neoplasm of lower-outer quadrant of right female breast: Secondary | ICD-10-CM | POA: Diagnosis not present

## 2022-04-07 LAB — RAD ONC ARIA SESSION SUMMARY
Course Elapsed Days: 16
Plan Fractions Treated to Date: 13
Plan Prescribed Dose Per Fraction: 2.66 Gy
Plan Total Fractions Prescribed: 16
Plan Total Prescribed Dose: 42.56 Gy
Reference Point Dosage Given to Date: 34.58 Gy
Reference Point Session Dosage Given: 2.66 Gy
Session Number: 13

## 2022-04-08 ENCOUNTER — Other Ambulatory Visit: Payer: Self-pay

## 2022-04-08 ENCOUNTER — Ambulatory Visit
Admission: RE | Admit: 2022-04-08 | Discharge: 2022-04-08 | Disposition: A | Discharge status: Home / Self Care | Payer: Medicare HMO | Source: Ambulatory Visit | Attending: Radiation Oncology | Admitting: Radiation Oncology

## 2022-04-08 DIAGNOSIS — C50511 Malignant neoplasm of lower-outer quadrant of right female breast: Secondary | ICD-10-CM | POA: Diagnosis not present

## 2022-04-08 LAB — RAD ONC ARIA SESSION SUMMARY
Course Elapsed Days: 17
Plan Fractions Treated to Date: 14
Plan Prescribed Dose Per Fraction: 2.66 Gy
Plan Total Fractions Prescribed: 16
Plan Total Prescribed Dose: 42.56 Gy
Reference Point Dosage Given to Date: 37.24 Gy
Reference Point Session Dosage Given: 2.66 Gy
Session Number: 14

## 2022-04-11 ENCOUNTER — Encounter: Payer: Self-pay | Admitting: Internal Medicine

## 2022-04-11 ENCOUNTER — Inpatient Hospital Stay: Payer: Medicare HMO

## 2022-04-11 ENCOUNTER — Inpatient Hospital Stay (HOSPITAL_BASED_OUTPATIENT_CLINIC_OR_DEPARTMENT_OTHER): Payer: Medicare HMO | Admitting: Internal Medicine

## 2022-04-11 ENCOUNTER — Ambulatory Visit
Admission: RE | Admit: 2022-04-11 | Discharge: 2022-04-11 | Disposition: A | Discharge status: Home / Self Care | Payer: Medicare HMO | Source: Ambulatory Visit | Attending: Radiation Oncology | Admitting: Radiation Oncology

## 2022-04-11 ENCOUNTER — Other Ambulatory Visit: Payer: Self-pay

## 2022-04-11 VITALS — BP 181/83 | HR 60 | Temp 97.1°F | Resp 18 | Wt 243.0 lb

## 2022-04-11 DIAGNOSIS — Z170 Estrogen receptor positive status [ER+]: Secondary | ICD-10-CM | POA: Diagnosis not present

## 2022-04-11 DIAGNOSIS — C50811 Malignant neoplasm of overlapping sites of right female breast: Secondary | ICD-10-CM | POA: Diagnosis present

## 2022-04-11 DIAGNOSIS — I10 Essential (primary) hypertension: Secondary | ICD-10-CM | POA: Diagnosis not present

## 2022-04-11 DIAGNOSIS — Z79811 Long term (current) use of aromatase inhibitors: Secondary | ICD-10-CM | POA: Diagnosis not present

## 2022-04-11 DIAGNOSIS — Z85118 Personal history of other malignant neoplasm of bronchus and lung: Secondary | ICD-10-CM | POA: Diagnosis present

## 2022-04-11 DIAGNOSIS — D751 Secondary polycythemia: Secondary | ICD-10-CM | POA: Diagnosis not present

## 2022-04-11 DIAGNOSIS — E871 Hypo-osmolality and hyponatremia: Secondary | ICD-10-CM | POA: Diagnosis not present

## 2022-04-11 DIAGNOSIS — C50511 Malignant neoplasm of lower-outer quadrant of right female breast: Secondary | ICD-10-CM | POA: Diagnosis not present

## 2022-04-11 LAB — CBC WITH DIFFERENTIAL/PLATELET
Abs Immature Granulocytes: 0.01 10*3/uL (ref 0.00–0.07)
Basophils Absolute: 0 10*3/uL (ref 0.0–0.1)
Basophils Relative: 1 %
Eosinophils Absolute: 0.1 10*3/uL (ref 0.0–0.5)
Eosinophils Relative: 2 %
HCT: 45.8 % (ref 36.0–46.0)
Hemoglobin: 14.8 g/dL (ref 12.0–15.0)
Immature Granulocytes: 0 %
Lymphocytes Relative: 28 %
Lymphs Abs: 1.7 10*3/uL (ref 0.7–4.0)
MCH: 27.4 pg (ref 26.0–34.0)
MCHC: 32.3 g/dL (ref 30.0–36.0)
MCV: 84.8 fL (ref 80.0–100.0)
Monocytes Absolute: 0.5 10*3/uL (ref 0.1–1.0)
Monocytes Relative: 8 %
Neutro Abs: 3.8 10*3/uL (ref 1.7–7.7)
Neutrophils Relative %: 61 %
Platelets: 266 10*3/uL (ref 150–400)
RBC: 5.4 MIL/uL — ABNORMAL HIGH (ref 3.87–5.11)
RDW: 15 % (ref 11.5–15.5)
WBC: 6.2 10*3/uL (ref 4.0–10.5)
nRBC: 0 % (ref 0.0–0.2)

## 2022-04-11 LAB — BASIC METABOLIC PANEL
Anion gap: 10 (ref 5–15)
BUN: 12 mg/dL (ref 8–23)
CO2: 25 mmol/L (ref 22–32)
Calcium: 9.3 mg/dL (ref 8.9–10.3)
Chloride: 101 mmol/L (ref 98–111)
Creatinine, Ser: 0.86 mg/dL (ref 0.44–1.00)
GFR, Estimated: >60 mL/min (ref >60)
Glucose, Bld: 111 mg/dL — ABNORMAL HIGH (ref 70–99)
Potassium: 3.8 mmol/L (ref 3.5–5.1)
Sodium: 136 mmol/L (ref 135–145)

## 2022-04-11 LAB — RAD ONC ARIA SESSION SUMMARY
Course Elapsed Days: 20
Plan Fractions Treated to Date: 15
Plan Prescribed Dose Per Fraction: 2.66 Gy
Plan Total Fractions Prescribed: 16
Plan Total Prescribed Dose: 42.56 Gy
Reference Point Dosage Given to Date: 39.9 Gy
Reference Point Session Dosage Given: 2.66 Gy
Session Number: 15

## 2022-04-11 NOTE — Progress Notes (Signed)
New difficulty swallowing saliva at night when lying down.

## 2022-04-11 NOTE — Progress Notes (Signed)
[facility] NOTE  Patient Care Team: Tracie Harrier, MD as PCP - General (Internal Medicine) Telford Nab, RN as Oncology Nurse Navigator [name], MD as Consulting Physician (Cardiothoracic Surgery) Cammie Sickle, MD as Consulting Physician (Internal Medicine) Nestor Lewandowsky, MD (Inactive) as Referring Physician (Cardiothoracic Surgery) Theodore Demark, RN (Inactive) as Oncology Nurse Navigator  CHIEF COMPLAINTS/PURPOSE OF CONSULTATION: lung cancer/ Breast cancer  #  Oncology History Overview Note  # JAN 2021- ADENOCA [Dr.A; [place] 2021-]DEC 2020- 13 mm lobular somewhat branching right upper lobe lesion is not hypermetabolic.Number of Lymph Nodes Examined: 17 ; Pathologic Stage Classification (pTNM, AJCC 8th Edition): pT1c, pN0 [Dr.Henderickson]; STAGE I-adenocarcinoma; no adjuvant therapy  # Hx of barrets [[facility] -[facility]]  # Hx of garstic bypass [1970s]  SPECIMEN  Procedure: Lumpectomy  Specimen Laterality: Right   TUMOR  Histologic Type: Invasive mammary carcinoma, no special type (ductal)  Histologic Grade (Nottingham Histologic Score)                       Glandular (Acinar)/Tubular Differentiation: 1                       Nuclear Pleomorphism: 2                       Mitotic Rate: 1                       Overall Grade: 1  Tumor Size: 7 x 7 x 5 mm  Tumor Focality: Unifocal  Tumor Extent: Not applicable  Ductal Carcinoma In Situ (DCIS): Not identified  Lymphatic and/or Vascular Invasion: Not identified  Treatment Effect in the Breast: No known presurgical treatment         Malignant neoplasm of right upper lobe of lung ([place])  03/08/2019 Initial Diagnosis   Malignant neoplasm of right upper lobe of lung ([place])   04/03/2019 Cancer Staging   Staging form: Lung, AJCC 8th Edition - Clinical stage from 04/03/2019: ycT1, cN0, cM0 - Signed by [name], MD on 04/03/2019   Carcinoma of overlapping sites of right breast in  female, estrogen receptor positive ([place])  05/25/2021 Initial Diagnosis   Carcinoma of overlapping sites of right breast in female, estrogen receptor positive ([place])   05/25/2021 Cancer Staging   Staging form: Breast, AJCC 8th Edition - Pathologic: Stage IA (pT1b, pN0, cM0, G1, ER+, PR+, HER2-) - Signed by Cammie Sickle, MD on 05/25/2021 Histologic grading system: 3 grade system    HISTORY OF PRESENTING ILLNESS: Ambulating independently.  Alone.  Glenda Gomez 72 y.o.  female with right upper lobe stage I lung cancer ; and stage I ER/PR positive breast cancer-currently on anastrozole is here for follow-up.   Patient reluctantly started radiation.  She is currently getting radiation.   Hot flashes not any worse.  Overall improved.  Also complains of  back pain-likely due to arthritis in the back.  Difficulty with mobility.   Review of Systems  Constitutional:  Positive for malaise/fatigue. Negative for chills, diaphoresis, fever and weight loss.  HENT:  Negative for nosebleeds and sore throat.   Eyes:  Negative for double vision.  Respiratory:  Negative for hemoptysis, sputum production, shortness of breath and wheezing.   Cardiovascular:  Negative for chest pain, palpitations, orthopnea and leg swelling.  Gastrointestinal:  Negative for abdominal pain, blood in stool, constipation, diarrhea, heartburn, melena, nausea  and vomiting.  Genitourinary:  Negative for dysuria, frequency and urgency.  Musculoskeletal:  Positive for back pain and joint pain.  Skin: Negative.  Negative for itching and rash.  Neurological:  Negative for tingling and focal weakness.  Endo/Heme/Allergies:  Does not bruise/bleed easily.  Psychiatric/Behavioral:  Negative for depression. The patient is not nervous/anxious and does not have insomnia.      MEDICAL HISTORY:  Past Medical History:  Diagnosis Date   Adenocarcinoma ([place])    right upper lobe   Arthritis    Chicken pox    Dyspnea     Environmental and seasonal allergies    Fibromyalgia    GERD (gastroesophageal reflux disease)    Hemorrhoid    Hypersomnia    Hypertension    Hypothyroidism    Lung cancer ([place])    Malignant neoplasm of right upper lobe of lung ([place]) 03/08/2019   Myositis ossificans    Osteoarthritis    Polycythemia 07/16/2021   dx by Dr. Ginette Pitman   Thyroid disease     SURGICAL HISTORY: Past Surgical History:  Procedure Laterality Date    bengin tongue lesion     Benign 1980's   ABDOMINAL HYSTERECTOMY  1979   BACK SURGERY     bladder tack  2011   BREAST BIOPSY Left    neg   BREAST BIOPSY Right 02/22/2021   u/s bc ribbon 8:00 path pending   BREAST LUMPECTOMY WITH SENTINEL LYMPH NODE BIOPSY Right 03/05/2021   Procedure: BREAST LUMPECTOMY WITH SENTINEL LYMPH NODE BX;  Surgeon: Robert Bellow, MD;  Location: [facility];  Service: General;  Laterality: Right;   CHOLECYSTECTOMY  2010   COLONOSCOPY  12/22/2011   COLONOSCOPY WITH PROPOFOL N/A 03/24/2017   Procedure: COLONOSCOPY WITH PROPOFOL;  Surgeon: Lollie Sails, MD;  Location: [facility];  Service: Endoscopy;  Laterality: N/A;   COLONOSCOPY WITH PROPOFOL N/A 07/22/2020   Procedure: COLONOSCOPY WITH PROPOFOL;  Surgeon: Toledo, Benay Pike, MD;  Location: [facility];  Service: Gastroenterology;  Laterality: N/A;   ESOPHAGOGASTRODUODENOSCOPY (EGD) WITH PROPOFOL N/A 03/24/2017   Procedure: ESOPHAGOGASTRODUODENOSCOPY (EGD) WITH PROPOFOL;  Surgeon: Lollie Sails, MD;  [facility];  Service: Endoscopy;  Laterality: N/A;   ESOPHAGOGASTRODUODENOSCOPY (EGD) WITH PROPOFOL N/A 07/22/2020   Procedure: ESOPHAGOGASTRODUODENOSCOPY (EGD) WITH PROPOFOL;  Surgeon: Toledo, Benay Pike, MD;  Location: [facility];  Service: Gastroenterology;  Laterality: N/A;   GASTRIC BYPASS OPEN     [id number] complicated by herniated stomach requiring reversal with mesh   HEMORRHOID SURGERY     INTERCOSTAL NERVE BLOCK Right 03/29/2019   Procedure:  Intercostal Nerve Block;  Surgeon: [name], MD;  Location: [facility];  Service: Thoracic;  Laterality: Right;   JOINT REPLACEMENT Bilateral    TKR   KNEE ARTHROPLASTY Right 07/06/2016   Procedure: COMPUTER ASSISTED TOTAL KNEE ARTHROPLASTY;  Surgeon: Dereck Leep, MD;  Location: [facility];  Service: Orthopedics;  Laterality: Right;   KNEE ARTHROSCOPY Left 10/19/2006   x 3   KNEE ARTHROSCOPY Right    x2   LOBECTOMY Right 03/29/2019   XI ROBOTIC ASSISTED THORASCOPY- RIGHT UPPER LOBECTOMY (Right Chest)   NODE DISSECTION  03/29/2019   Procedure: Node Dissection;  Surgeon: [name], MD;  Location: [facility];  Service: Thoracic;;   plantar fascia Left    PLANTAR FASCIA RELEASE Right    x 3   POSTERIOR LAMINECTOMY / DECOMPRESSION LUMBAR SPINE  06/24/2015   SHOULDER ARTHROSCOPY WITH DEBRIDEMENT AND BICEP TENDON REPAIR Left 09/25/2018  Procedure: SHOULDER ARTHROSCOPY WITH DEBRIDEMENT DECOMPRESSION AND BICEP TENDON REPAIR - SCRUB TECH;  Surgeon: Corky Mull, MD;  Location: [facility];  Service: Orthopedics;  Laterality: Left;   TONSILLECTOMY     childhood   TOTAL KNEE ARTHROPLASTY Left 11/17/2004   VIDEO BRONCHOSCOPY WITH ENDOBRONCHIAL NAVIGATION N/A 03/04/2019   Procedure: VIDEO BRONCHOSCOPY WITH ENDOBRONCHIAL NAVIGATION;  Surgeon: Ottie Glazier, MD;  Location: [facility];  Service: Thoracic;  Laterality: N/A;   VIDEO BRONCHOSCOPY WITH ENDOBRONCHIAL ULTRASOUND N/A 03/04/2019   Procedure: VIDEO BRONCHOSCOPY WITH ENDOBRONCHIAL ULTRASOUND;  Surgeon: Ottie Glazier, MD;  Location: [facility];  Service: Thoracic;  Laterality: N/A;    SOCIAL HISTORY: Social History   Socioeconomic History   Marital status: Divorced    Spouse name: Not on file   Number of children: Not on file   Years of education: Not on file   Highest education level: Not on file  Occupational History   Not on file  Tobacco Use   Smoking status: Former    Packs/day: 2.00    Years: 10.00    Total pack  years: 20.00    Types: Cigarettes    Quit date: 07/17/1993    Years since quitting: 28.7   Smokeless tobacco: Never  Vaping Use   Vaping Use: Never used  Substance and Sexual Activity   Alcohol use: No   Drug use: No   Sexual activity: Not on file  Other Topics Concern   Not on file  Social History Narrative   1ppd- quit > 20 years ago; no alcohol; in [place]; lives self. retd- cook.    Social Determinants of Health   Financial Resource Strain: Not on file  Food Insecurity: Not on file  Transportation Needs: Not on file  Physical Activity: Not on file  Stress: Not on file  Social Connections: Not on file  Intimate Partner Violence: Not on file    FAMILY HISTORY: Family History  Problem Relation Age of Onset   Breast cancer Sister 28   Breast cancer Maternal Aunt 57   Lung cancer Maternal Aunt    Breast cancer Sister 18   Alzheimer's disease Mother    Arthritis Mother    Diabetes Mother    Heart failure Brother    Pancreatic cancer Brother 90   Lung cancer Brother    Liver cancer Father     ALLERGIES:  is allergic to flagyl [metronidazole], phentermine, and pregabalin.  MEDICATIONS:  Current Outpatient Medications  Medication Sig Dispense Refill   anastrozole (ARIMIDEX) 1 MG tablet Take 1 tablet (1 mg total) by mouth daily. 90 tablet 1   ascorbic acid (VITAMIN C) 1000 MG tablet Take 1,000 mg by mouth daily.     celecoxib (CELEBREX) 200 MG capsule Take 1 capsule (200 mg total) by mouth 2 (two) times daily with a meal. 60 capsule 1   cyclobenzaprine (FLEXERIL) 10 MG tablet Take 1 tablet (10 mg total) by mouth 3 (three) times daily as needed for muscle spasms. 90 tablet 0   furosemide (LASIX) 20 MG tablet Take 20 mg by mouth daily as needed for fluid or edema.     levothyroxine (SYNTHROID) 88 MCG tablet Take 88 mcg by mouth daily before breakfast.     losartan (COZAAR) 50 MG tablet Take 50 mg by mouth daily.     omeprazole (PRILOSEC) 20 MG capsule Take 20 mg by mouth 2  (two) times daily before a meal.      triamterene-hydrochlorothiazide (MAXZIDE-25) 37.5-25 MG tablet Take 1  tablet by mouth daily.     Vitamin D, Ergocalciferol, (DRISDOL) 1.25 MG (50000 UNIT) CAPS capsule Take 50,000 Units by mouth once a week. Friday     zolpidem (AMBIEN) 5 MG tablet Take 5-7.5 mg by mouth at bedtime.     oxyCODONE-acetaminophen (PERCOCET/ROXICET) 5-325 MG tablet Take 1 tablet by mouth every 8 (eight) hours as needed. (Patient not taking: Reported on 04/11/2022)     Semaglutide (OZEMPIC, 0.25 OR 0.5 MG/DOSE, [place]) Inject 0.25 mg into the skin every Sunday.     No current facility-administered medications for this visit.      Marland Kitchen  PHYSICAL EXAMINATION: ECOG PERFORMANCE STATUS: 0 - Asymptomatic  Vitals:   04/11/22 1100  BP: (!) 181/83  Pulse: 60  Resp: 18  Temp: (!) 97.1 F (36.2 C)   Filed Weights   04/11/22 1100  Weight: 243 lb (110.2 kg)    Physical Exam Constitutional:      Comments: Obese. She is walking herself.  HENT:     Head: Normocephalic and atraumatic.     Mouth/Throat:     Pharynx: No oropharyngeal exudate.  Eyes:     Pupils: Pupils are equal, round, and reactive to light.  Cardiovascular:     Rate and Rhythm: Normal rate and regular rhythm.  Pulmonary:     Effort: Pulmonary effort is normal. No respiratory distress.     Breath sounds: Normal breath sounds. No wheezing.  Abdominal:     General: Bowel sounds are normal. There is no distension.     Palpations: Abdomen is soft. There is no mass.     Tenderness: There is no abdominal tenderness. There is no guarding or rebound.  Musculoskeletal:        General: No tenderness. Normal range of motion.     Cervical back: Normal range of motion and neck supple.  Skin:    General: Skin is warm.  Neurological:     Mental Status: She is alert and oriented to person, place, and time.  Psychiatric:        Mood and Affect: Affect normal.    LABORATORY DATA:  I have reviewed the data as listed Lab  Results  Component Value Date   WBC 6.2 04/11/2022   HGB 14.8 04/11/2022   HCT 45.8 04/11/2022   MCV 84.8 04/11/2022   PLT 266 04/11/2022   Recent Labs    08/04/21 1001 12/06/21 0944 04/11/22 1028  NA 137 137 136  K 3.7 3.4* 3.8  CL 101 100 101  CO2 '27 29 25  '$ GLUCOSE 100* 113* 111*  BUN '13 12 12  '$ CREATININE 0.88 0.76 0.86  CALCIUM 9.0 9.2 9.3  GFRNONAA >60 >60 >60  PROT 7.1  --   --   ALBUMIN 3.8  --   --   AST 25  --   --   ALT 13  --   --   ALKPHOS 76  --   --   BILITOT 0.7  --   --     RADIOGRAPHIC STUDIES: I have personally reviewed the radiological images as listed and agreed with the findings in the report. No results found.   ASSESSMENT & PLAN:   Carcinoma of overlapping sites of right breast in female, estrogen receptor positive ([place]) #Right breast invasive mammary carcinoma -PT1BN0 ER/PR positive HER2 negative- INVASIVE MAMMARY CARCINOMA, NO SPECIAL TYPE; grade 1; given low risk-okay to hold off Oncotype. Mammo   # reluctantly started RT. Currently on Radiation.  Tolerating fairly well.   #  Continue anastrozole at this time.  Patient tolerating fairly well except for hot flashes-not any worse.stable.   # Hot flashes grade 1--second anastrozole. stable.   # Erythrocytosis-likely secondary- HCT- 16 [MAY 2023; PCP]; JUNE 2023- 15.1 [>30 y- ex-smoker ] JAK-2 -negative.   # HTN- systolic BP [id number]; at home [id number] as per pt. monitor for now. stable.   # RIGHT UPPER LOBE LUNG CA-stage I-status post resection clear margins-JUNE 2023--stable exam no evidence of any progressive disease/recurrent disease.     # screening for Osteoporosis: JUNE 2023- BMD measured at [facility] Neck -T-score of 0.0. on Vit D; add calcium.   # mild hypokalemia: likely secondary to triamterene hydrochlorothiazide.  Recommend increased dietary intake of potassium rich foods.stable.    # DISPOSITION: # follow up in 6 months-  MD;labs- cbc/bmp; Dr.B   All questions were answered. The patient  knows to call the clinic with any problems, questions or concerns.    Cammie Sickle, MD 04/11/2022 11:54 AM

## 2022-04-11 NOTE — Assessment & Plan Note (Addendum)
#  Right breast invasive mammary carcinoma -PT1BN0 ER/PR positive HER2 negative- INVASIVE MAMMARY CARCINOMA, NO SPECIAL TYPE; grade 1; given low risk-okay to hold off Oncotype. Mammo   # reluctantly started RT. Currently on Radiation.  Tolerating fairly well.   # Continue anastrozole at this time.  Patient tolerating fairly well except for hot flashes-not any worse.stable.   # Hot flashes grade 1--second anastrozole. stable.   # Erythrocytosis-likely secondary- HCT- 16 [MAY 2023; PCP]; JUNE 2023- 15.1 [>30 y- ex-smoker ] JAK-2 -negative.   # HTN- systolic BP 461; at home 901 as per pt. monitor for now. stable.   # RIGHT UPPER LOBE LUNG CA-stage I-status post resection clear margins-JUNE 2023--stable exam no evidence of any progressive disease/recurrent disease.     # screening for Osteoporosis: JUNE 2023- BMD measured at [facility] Neck -T-score of 0.0. on Vit D; add calcium.   # Obesity [stopped Ozempic because of issues]-monitor for now.  # mild hypokalemia: likely secondary to triamterene hydrochlorothiazide.  Recommend increased dietary intake of potassium rich foods.stable.    # DISPOSITION: # follow up in 6 months-  MD;labs- cbc/bmp; Dr.B

## 2022-04-12 ENCOUNTER — Ambulatory Visit: Admission: RE | Admit: 2022-04-12 | Payer: Medicare HMO | Source: Ambulatory Visit

## 2022-04-12 ENCOUNTER — Other Ambulatory Visit: Payer: Self-pay

## 2022-04-12 ENCOUNTER — Ambulatory Visit
Admission: RE | Admit: 2022-04-12 | Discharge: 2022-04-12 | Disposition: A | Discharge status: Home / Self Care | Payer: Medicare HMO | Source: Ambulatory Visit | Attending: Radiation Oncology | Admitting: Radiation Oncology

## 2022-04-12 DIAGNOSIS — C50511 Malignant neoplasm of lower-outer quadrant of right female breast: Secondary | ICD-10-CM | POA: Diagnosis not present

## 2022-04-12 LAB — RAD ONC ARIA SESSION SUMMARY
Course Elapsed Days: 21
Plan Fractions Treated to Date: 16
Plan Prescribed Dose Per Fraction: 2.66 Gy
Plan Total Fractions Prescribed: 16
Plan Total Prescribed Dose: 42.56 Gy
Reference Point Dosage Given to Date: 42.56 Gy
Reference Point Session Dosage Given: 2.66 Gy
Session Number: 16

## 2022-04-13 ENCOUNTER — Other Ambulatory Visit: Payer: Self-pay

## 2022-04-13 ENCOUNTER — Ambulatory Visit
Admission: RE | Admit: 2022-04-13 | Discharge: 2022-04-13 | Disposition: A | Discharge status: Home / Self Care | Payer: Medicare HMO | Source: Ambulatory Visit | Attending: Radiation Oncology | Admitting: Radiation Oncology

## 2022-04-13 DIAGNOSIS — C50511 Malignant neoplasm of lower-outer quadrant of right female breast: Secondary | ICD-10-CM | POA: Diagnosis not present

## 2022-04-13 LAB — RAD ONC ARIA SESSION SUMMARY
Course Elapsed Days: 22
Plan Fractions Treated to Date: 1
Plan Prescribed Dose Per Fraction: 2 Gy
Plan Total Fractions Prescribed: 5
Plan Total Prescribed Dose: 10 Gy
Reference Point Dosage Given to Date: 2 Gy
Reference Point Session Dosage Given: 2 Gy
Session Number: 17

## 2022-04-14 ENCOUNTER — Other Ambulatory Visit: Payer: Self-pay

## 2022-04-14 ENCOUNTER — Ambulatory Visit
Admission: RE | Admit: 2022-04-14 | Discharge: 2022-04-14 | Disposition: A | Discharge status: Home / Self Care | Payer: Medicare HMO | Source: Ambulatory Visit | Attending: Radiation Oncology | Admitting: Radiation Oncology

## 2022-04-14 DIAGNOSIS — C50511 Malignant neoplasm of lower-outer quadrant of right female breast: Secondary | ICD-10-CM | POA: Diagnosis not present

## 2022-04-14 LAB — RAD ONC ARIA SESSION SUMMARY
Course Elapsed Days: 23
Plan Fractions Treated to Date: 2
Plan Prescribed Dose Per Fraction: 2 Gy
Plan Total Fractions Prescribed: 5
Plan Total Prescribed Dose: 10 Gy
Reference Point Dosage Given to Date: 4 Gy
Reference Point Session Dosage Given: 2 Gy
Session Number: 18

## 2022-04-15 ENCOUNTER — Ambulatory Visit
Admission: RE | Admit: 2022-04-15 | Discharge: 2022-04-15 | Disposition: A | Discharge status: Home / Self Care | Payer: Medicare HMO | Source: Ambulatory Visit | Attending: Radiation Oncology | Admitting: Radiation Oncology

## 2022-04-15 ENCOUNTER — Other Ambulatory Visit: Payer: Self-pay

## 2022-04-15 DIAGNOSIS — Z510 Encounter for antineoplastic radiation therapy: Secondary | ICD-10-CM | POA: Insufficient documentation

## 2022-04-15 DIAGNOSIS — C50511 Malignant neoplasm of lower-outer quadrant of right female breast: Secondary | ICD-10-CM | POA: Insufficient documentation

## 2022-04-15 LAB — RAD ONC ARIA SESSION SUMMARY
Course Elapsed Days: 24
Plan Fractions Treated to Date: 3
Plan Prescribed Dose Per Fraction: 2 Gy
Plan Total Fractions Prescribed: 5
Plan Total Prescribed Dose: 10 Gy
Reference Point Dosage Given to Date: 6 Gy
Reference Point Session Dosage Given: 2 Gy
Session Number: 19

## 2022-04-18 ENCOUNTER — Other Ambulatory Visit: Payer: Self-pay

## 2022-04-18 ENCOUNTER — Ambulatory Visit
Admission: RE | Admit: 2022-04-18 | Discharge: 2022-04-18 | Disposition: A | Discharge status: Home / Self Care | Payer: Medicare HMO | Source: Ambulatory Visit | Attending: Radiation Oncology | Admitting: Radiation Oncology

## 2022-04-18 ENCOUNTER — Other Ambulatory Visit: Payer: Self-pay | Admitting: *Deleted

## 2022-04-18 DIAGNOSIS — Z510 Encounter for antineoplastic radiation therapy: Secondary | ICD-10-CM | POA: Diagnosis not present

## 2022-04-18 LAB — RAD ONC ARIA SESSION SUMMARY
Course Elapsed Days: 27
Plan Fractions Treated to Date: 4
Plan Prescribed Dose Per Fraction: 2 Gy
Plan Total Fractions Prescribed: 5
Plan Total Prescribed Dose: 10 Gy
Reference Point Dosage Given to Date: 8 Gy
Reference Point Session Dosage Given: 2 Gy
Session Number: 20

## 2022-04-18 MED ORDER — SILVER SULFADIAZINE 1 % EX CREA: 1.0000 | TOPICAL_CREAM | Freq: Two times a day (BID) | CUTANEOUS | 1 refills | Status: DC

## 2022-04-19 ENCOUNTER — Ambulatory Visit
Admission: RE | Admit: 2022-04-19 | Discharge: 2022-04-19 | Disposition: A | Discharge status: Home / Self Care | Payer: Medicare HMO | Source: Ambulatory Visit | Attending: Radiation Oncology | Admitting: Radiation Oncology

## 2022-04-19 ENCOUNTER — Other Ambulatory Visit: Payer: Self-pay

## 2022-04-19 DIAGNOSIS — Z510 Encounter for antineoplastic radiation therapy: Secondary | ICD-10-CM | POA: Diagnosis not present

## 2022-04-19 LAB — RAD ONC ARIA SESSION SUMMARY
Course Elapsed Days: 28
Plan Fractions Treated to Date: 5
Plan Prescribed Dose Per Fraction: 2 Gy
Plan Total Fractions Prescribed: 5
Plan Total Prescribed Dose: 10 Gy
Reference Point Dosage Given to Date: 10 Gy
Reference Point Session Dosage Given: 2 Gy
Session Number: 21

## 2022-04-25 ENCOUNTER — Ambulatory Visit
Admission: RE | Admit: 2022-04-25 | Discharge: 2022-04-25 | Disposition: A | Discharge status: Home / Self Care | Payer: Medicare HMO | Source: Ambulatory Visit | Attending: Radiation Oncology | Admitting: Radiation Oncology

## 2022-04-25 ENCOUNTER — Encounter: Payer: Self-pay | Admitting: Radiation Oncology

## 2022-04-25 VITALS — BP 171/80 | HR 65 | Temp 99.0°F | Resp 20 | Wt 244.0 lb

## 2022-04-25 DIAGNOSIS — Z923 Personal history of irradiation: Secondary | ICD-10-CM | POA: Diagnosis not present

## 2022-04-25 DIAGNOSIS — Z170 Estrogen receptor positive status [ER+]: Secondary | ICD-10-CM | POA: Diagnosis not present

## 2022-04-25 DIAGNOSIS — C50511 Malignant neoplasm of lower-outer quadrant of right female breast: Secondary | ICD-10-CM | POA: Insufficient documentation

## 2022-04-25 NOTE — Progress Notes (Signed)
Radiation Oncology Follow up Note  Name: Glenda Gomez   Date:   04/25/2022 MRN:  [id number] DOB: 23-Jan-1951    This 72 y.o. female presents to the clinic today for at patient's request for some moist desquamation of her skin in her treatment field.  REFERRING PROVIDER: Tracie Harrier, MD  HPI: Patient is a 72 year old female recently completed radiation therapy to her right breast for stage Ia ER/PR positive invasive mammary carcinoma she has developed some areas of moist desquamation mostly in the right axilla this is a small area she has a gauze placed on those areas.  She also has gauze in the inframammary fold.  She has been using Silvadene cream..  COMPLICATIONS OF TREATMENT: none  FOLLOW UP COMPLIANCE: keeps appointments   PHYSICAL EXAM:  BP (!) 171/80 (BP Location: Left Arm, Patient Position: Sitting, Cuff Size: Normal) Comment: Patient at level ten pain today  Pulse 65   Temp 99 F (37.2 C) (Tympanic)   Resp 20   Wt 244 lb (110.7 kg)   BMI 44.63 kg/m  Areas of moist desquamation as described above.  RADIOLOGY RESULTS: No current films for review at this time instructed patient not to use any gauze on the areas of moist desquamation since this will prolong that.  She is to use Ovadine and small amount to these areas twice a day.  I will reevaluate her on Thursday.  I have assured her this will improve by that time.  She also can use some Advil for pain.Reevaluation on this there is today.  PLAN: As above    Noreene Filbert, MD

## 2022-04-28 ENCOUNTER — Ambulatory Visit: Admission: RE | Admit: 2022-04-28 | Payer: Medicare HMO | Source: Ambulatory Visit | Admitting: Radiation Oncology

## 2022-05-09 ENCOUNTER — Ambulatory Visit: Payer: Medicare HMO | Admitting: Radiation Oncology

## 2022-05-30 ENCOUNTER — Other Ambulatory Visit: Payer: Self-pay | Admitting: Internal Medicine

## 2022-06-06 ENCOUNTER — Ambulatory Visit
Admission: RE | Admit: 2022-06-06 | Discharge: 2022-06-06 | Disposition: A | Discharge status: Home / Self Care | Payer: Medicare HMO | Source: Ambulatory Visit | Attending: Radiation Oncology | Admitting: Radiation Oncology

## 2022-06-06 ENCOUNTER — Encounter: Payer: Self-pay | Admitting: Radiation Oncology

## 2022-06-06 VITALS — BP 150/90 | HR 60 | Temp 97.7°F | Resp 16 | Ht 63.0 in | Wt 244.0 lb

## 2022-06-06 DIAGNOSIS — Z170 Estrogen receptor positive status [ER+]: Secondary | ICD-10-CM | POA: Diagnosis present

## 2022-06-06 DIAGNOSIS — Z923 Personal history of irradiation: Secondary | ICD-10-CM | POA: Diagnosis not present

## 2022-06-06 DIAGNOSIS — Z79811 Long term (current) use of aromatase inhibitors: Secondary | ICD-10-CM | POA: Insufficient documentation

## 2022-06-06 DIAGNOSIS — C50511 Malignant neoplasm of lower-outer quadrant of right female breast: Secondary | ICD-10-CM

## 2022-06-06 NOTE — Progress Notes (Signed)
Radiation Oncology Follow up Note  Name: Glenda Gomez   Date:   06/06/2022 MRN:  [id number] DOB: Jun 26, 1950    This 72 y.o. female presents to the clinic today for 1 month follow-up status post whole breast radiation to her right breast for stage Ia ER/PR positive invasive mammary carcinoma.  REFERRING PROVIDER: Barbette Reichmann, MD  HPI: Patient is a 72 year old female now seen out 1 month having completed whole breast radiation to her right breast for stage Ia ER/PR positive invasive mammary carcinoma she did have some moist desquamation after completion of radiation that is completely cleared.  Her skin is doing well.  She specifically denies breast tenderness cough or bone pain..  She has been started on Arimidex tolerating that well without side effect  COMPLICATIONS OF TREATMENT: none  FOLLOW UP COMPLIANCE: keeps appointments   PHYSICAL EXAM:  BP (!) 150/90 (BP Location: Right Wrist, Patient Position: Sitting, Cuff Size: Small)   Pulse 60   Temp 97.7 F (36.5 C) (Tympanic)   Resp 16   Ht  (1.6 m) Comment: stated HT  Wt 244 lb (110.7 kg)   BMI 43.22 kg/m  Lungs are clear to A&P cardiac examination essentially unremarkable with regular rate and rhythm. No dominant mass or nodularity is noted in either breast in 2 positions examined. Incision is well-healed. No axillary or supraclavicular adenopathy is appreciated. Cosmetic result is excellent.  Well-developed well-nourished patient in NAD. HEENT reveals PERLA, EOMI, discs not visualized.  Oral cavity is clear. No oral mucosal lesions are identified. Neck is clear without evidence of cervical or supraclavicular adenopathy. Lungs are clear to A&P. Cardiac examination is essentially unremarkable with regular rate and rhythm without murmur rub or thrill. Abdomen is benign with no organomegaly or masses noted. Motor sensory and DTR levels are equal and symmetric in the upper and lower extremities. Cranial nerves II through XII are  grossly intact. Proprioception is intact. No peripheral adenopathy or edema is identified. No motor or sensory levels are noted. Crude visual fields are within normal range.  RADIOLOGY RESULTS: No current films for review  PLAN: Present time patient is doing well her skin is completely healed at this time.  I have asked to see her back in 6 months for follow-up.  Patient is to call at anytime with any concerns she continues on Arimidex without side effect.  I would like to take this opportunity to thank you for allowing me to participate in the care of your patient.Carmina Miller, MD

## 2022-07-25 ENCOUNTER — Other Ambulatory Visit: Payer: Self-pay | Admitting: Physical Medicine and Rehabilitation

## 2022-07-25 DIAGNOSIS — M5414 Radiculopathy, thoracic region: Secondary | ICD-10-CM

## 2022-08-01 ENCOUNTER — Ambulatory Visit
Admission: RE | Admit: 2022-08-01 | Discharge: 2022-08-01 | Disposition: A | Discharge status: Home / Self Care | Payer: Medicare HMO | Source: Ambulatory Visit | Attending: Physical Medicine and Rehabilitation | Admitting: Physical Medicine and Rehabilitation

## 2022-08-01 DIAGNOSIS — M5414 Radiculopathy, thoracic region: Secondary | ICD-10-CM | POA: Insufficient documentation

## 2022-08-25 ENCOUNTER — Other Ambulatory Visit: Payer: Self-pay | Admitting: Internal Medicine

## 2022-08-25 DIAGNOSIS — I10 Essential (primary) hypertension: Secondary | ICD-10-CM

## 2022-08-25 DIAGNOSIS — R001 Bradycardia, unspecified: Secondary | ICD-10-CM

## 2022-08-31 NOTE — Progress Notes (Signed)
Referring Physician:  Merri Ray, MD [address] [place],  [place] [address]  Primary Physician:  Barbette Reichmann, MD  History of Present Illness: 09/01/2022 [name] is here today with a chief complaint of  low back pain.  This begins when she stands and begins walking.  She feels a very discrete area of severe pain that occurs in her lower right side of her back near her buttock.  This causes it to be very difficult to walk.  She does not have any radiating pain into her leg.  She has no numbness or tingling.  Standing still and laying down makes it better.  Walking makes it worse.  This has been slowly worsening over the past year.  She had back surgery in 2017.  That was for left-sided symptoms that improved after surgery.  Conservative measures:  Physical therapy: yes  Multimodal medical therapy including regular antiinflammatories:  Celebrex, Tylenol, Baclofen, Flexeril, Tramadol, Hydocodone, Percocet  Injections:  06/14/2022: Right L1-2 and L2-3 transforaminal ESI (20% relief) 02/24/2022: Left C5-6 transforaminal ESI (50% relief) 05/01/2015: Left L2-3 transforaminal ESI  01/28/2015: Left L4-5 transforaminal ESI (Mild relief) 01/05/2015: Left L4-5 transforaminal ESI (Moderate relief)   Past Surgery:  Lumbar two-three laminectomy; Surgeon: Judeth Porch, MD 07/17/2015  Glenda Gomez has no symptoms of cervical myelopathy.  The symptoms are causing a significant impact on the patient's life.   I have utilized the care everywhere function in epic to review the outside records available from external health systems.  Review of Systems:  A 10 point review of systems is negative, except for the pertinent positives and negatives detailed in the HPI.  Past Medical History: Past Medical History:  Diagnosis Date   Adenocarcinoma (HCC)    right upper lobe   Arthritis    Chicken pox    Dyspnea    Environmental and seasonal allergies     Fibromyalgia    GERD (gastroesophageal reflux disease)    Hemorrhoid    Hypersomnia    Hypertension    Hypothyroidism    Lung cancer (HCC)    Malignant neoplasm of right upper lobe of lung (HCC) 03/08/2019   Myositis ossificans    Osteoarthritis    Polycythemia 07/16/2021   dx by Dr. Marcello Fennel   Thyroid disease     Past Surgical History: Past Surgical History:  Procedure Laterality Date    bengin tongue lesion     Benign 1980's   ABDOMINAL HYSTERECTOMY  1979   BACK SURGERY     bladder tack  2011   BREAST BIOPSY Left    neg   BREAST BIOPSY Right 02/22/2021   u/s bc ribbon 8:00 path pending   BREAST LUMPECTOMY WITH SENTINEL LYMPH NODE BIOPSY Right 03/05/2021   Procedure: BREAST LUMPECTOMY WITH SENTINEL LYMPH NODE BX;  Surgeon: Earline Mayotte, MD;  Location: [facility];  Service: General;  Laterality: Right;   CHOLECYSTECTOMY  2010   COLONOSCOPY  12/22/2011   COLONOSCOPY WITH PROPOFOL N/A 03/24/2017   Procedure: COLONOSCOPY WITH PROPOFOL;  Surgeon: Christena Deem, MD;  Location: [facility];  Service: Endoscopy;  Laterality: N/A;   COLONOSCOPY WITH PROPOFOL N/A 07/22/2020   Procedure: COLONOSCOPY WITH PROPOFOL;  Surgeon: Toledo, Boykin Nearing, MD;  Location: [facility];  Service: Gastroenterology;  Laterality: N/A;   ESOPHAGOGASTRODUODENOSCOPY (EGD) WITH PROPOFOL N/A 03/24/2017   Procedure: ESOPHAGOGASTRODUODENOSCOPY (EGD) WITH PROPOFOL;  Surgeon: Christena Deem, MD;  Location: [facility] END[facility];  Service: Endoscopy;  Laterality: N/A;  ESOPHAGOGASTRODUODENOSCOPY (EGD) WITH PROPOFOL N/A 07/22/2020   Procedure: ESOPHAGOGASTRODUODENOSCOPY (EGD) WITH PROPOFOL;  Surgeon: Toledo, Boykin Nearing, MD;  Location: [facility];  Service: Gastroenterology;  Laterality: N/A;   GASTRIC BYPASS OPEN     1970's complicated by herniated stomach requiring reversal with mesh   HEMORRHOID SURGERY     INTERCOSTAL NERVE BLOCK Right 03/29/2019   Procedure: Intercostal Nerve Block;  Surgeon:  Loreli Slot, MD;  Location: [facility] OR;  Service: Thoracic;  Laterality: Right;   JOINT REPLACEMENT Bilateral    TKR   KNEE ARTHROPLASTY Right 07/06/2016   Procedure: COMPUTER ASSISTED TOTAL KNEE ARTHROPLASTY;  Surgeon: Donato Heinz, MD;  Location: [facility];  Service: Orthopedics;  Laterality: Right;   KNEE ARTHROSCOPY Left 10/19/2006   x 3   KNEE ARTHROSCOPY Right    x2   LOBECTOMY Right 03/29/2019   XI ROBOTIC ASSISTED THORASCOPY- RIGHT UPPER LOBECTOMY (Right Chest)   NODE DISSECTION  03/29/2019   Procedure: Node Dissection;  Surgeon: Loreli Slot, MD;  Location: [facility] OR;  Service: Thoracic;;   plantar fascia Left    PLANTAR FASCIA RELEASE Right    x 3   POSTERIOR LAMINECTOMY / DECOMPRESSION LUMBAR SPINE  06/24/2015   SHOULDER ARTHROSCOPY WITH DEBRIDEMENT AND BICEP TENDON REPAIR Left 09/25/2018   Procedure: SHOULDER ARTHROSCOPY WITH DEBRIDEMENT DECOMPRESSION AND BICEP TENDON REPAIR - SCRUB TECH;  Surgeon: Christena Flake, MD;  Location: [facility];  Service: Orthopedics;  Laterality: Left;   TONSILLECTOMY     childhood   TOTAL KNEE ARTHROPLASTY Left 11/17/2004   VIDEO BRONCHOSCOPY WITH ENDOBRONCHIAL NAVIGATION N/A 03/04/2019   Procedure: VIDEO BRONCHOSCOPY WITH ENDOBRONCHIAL NAVIGATION;  Surgeon: Vida Rigger, MD;  Location: [facility];  Service: Thoracic;  Laterality: N/A;   VIDEO BRONCHOSCOPY WITH ENDOBRONCHIAL ULTRASOUND N/A 03/04/2019   Procedure: VIDEO BRONCHOSCOPY WITH ENDOBRONCHIAL ULTRASOUND;  Surgeon: Vida Rigger, MD;  Location: [facility];  Service: Thoracic;  Laterality: N/A;    Allergies: Allergies as of 09/01/2022 - Review Complete 06/06/2022  Allergen Reaction Noted   Flagyl [metronidazole] Diarrhea and Nausea And Vomiting 07/17/2013   Phentermine Other (See Comments) 07/21/2020   Pregabalin Other (See Comments) 07/17/2013    Medications:  Current Outpatient Medications:    ALPRAZolam (XANAX) 0.25 MG tablet, Take 0.25 mg by mouth at bedtime as  needed., Disp: , Rfl:    anastrozole (ARIMIDEX) 1 MG tablet, TAKE 1 TABLET(1 MG) BY MOUTH DAILY, Disp: 90 tablet, Rfl: 1   baclofen (LIORESAL) 10 MG tablet, Take 10 mg by mouth 2 (two) times daily as needed., Disp: , Rfl:    celecoxib (CELEBREX) 200 MG capsule, Take 1 capsule (200 mg total) by mouth 2 (two) times daily with a meal., Disp: 60 capsule, Rfl: 1   FLUoxetine (PROZAC) 10 MG capsule, Take 10 mg by mouth daily., Disp: , Rfl:    furosemide (LASIX) 20 MG tablet, Take 20 mg by mouth daily as needed for fluid or edema., Disp: , Rfl:    levothyroxine (SYNTHROID) 88 MCG tablet, Take 88 mcg by mouth daily before breakfast., Disp: , Rfl:    losartan (COZAAR) 50 MG tablet, Take 50 mg by mouth daily., Disp: , Rfl:    omeprazole (PRILOSEC) 20 MG capsule, Take 20 mg by mouth 2 (two) times daily before a meal. , Disp: , Rfl:    oxyCODONE-acetaminophen (PERCOCET/ROXICET) 5-325 MG tablet, Take 1 tablet by mouth every 4 (four) hours as needed., Disp: , Rfl:    predniSONE (STERAPRED UNI-PAK 48 TAB) 10 MG (48)  TBPK tablet, Take by mouth as needed., Disp: , Rfl:    triamterene-hydrochlorothiazide (MAXZIDE-25) 37.5-25 MG tablet, Take 1 tablet by mouth daily., Disp: , Rfl:    Vitamin D, Ergocalciferol, (DRISDOL) 1.25 MG (50000 UNIT) CAPS capsule, Take 50,000 Units by mouth once a week. Friday, Disp: , Rfl:    zolpidem (AMBIEN) 5 MG tablet, Take 5-7.5 mg by mouth at bedtime., Disp: , Rfl:   Social History: Social History   Tobacco Use   Smoking status: Former    Current packs/day: 0.00    Average packs/day: 2.0 packs/day for 10.0 years (20.0 ttl pk-yrs)    Types: Cigarettes    Start date: 07/18/1983    Quit date: 07/17/1993    Years since quitting: 29.1   Smokeless tobacco: Never  Vaping Use   Vaping status: Never Used  Substance Use Topics   Alcohol use: No   Drug use: No    Family Medical History: Family History  Problem Relation Age of Onset   Breast cancer Sister 56   Breast cancer Maternal  Aunt 88   Lung cancer Maternal Aunt    Breast cancer Sister 31   Alzheimer's disease Mother    Arthritis Mother    Diabetes Mother    Heart failure Brother    Pancreatic cancer Brother 65   Lung cancer Brother    Liver cancer Father     Physical Examination: Vitals:   09/01/22 1059  BP: (!) 156/63  Pulse: (!) 51    General: Patient is in no apparent distress. Attention to examination is appropriate.  Neck:   Supple.  Full range of motion.  Respiratory: Patient is breathing without any difficulty.   NEUROLOGICAL:     Awake, alert, oriented to person, place, and time.  Speech is clear and fluent.   Cranial Nerves: Pupils equal round and reactive to light.  Facial tone is symmetric.  Facial sensation is symmetric. Shoulder shrug is symmetric. Tongue protrusion is midline.  There is no pronator drift.  Strength: Side Biceps Triceps Deltoid Interossei Grip Wrist Ext. Wrist Flex.  R 5 5 5 5 5 5 5   L 5 5 5 5 5 5 5    Side Iliopsoas Quads Hamstring PF DF EHL  R 5 5 5 5 5 5   L 5 5 5 5 5 5    Reflexes are 1+ and symmetric at the biceps, triceps, brachioradialis, patella and achilles.   Hoffman's is absent.   Bilateral upper and lower extremity sensation is intact to light touch.    No evidence of dysmetria noted.  Gait is antalgic and requires a cane.  She has severe tenderness to palpation over the right SI joint.     Medical Decision Making  Imaging: MRI T spine 08/08/2022 IMPRESSION: 1. Left-sided lower thoracic facet arthropathy. No high-grade stenosis. No acute abnormality.     Electronically Signed   By: Obie Dredge M.D.   On: 08/08/2022 15:02  MRI L spine 10/27/2021 IMPRESSION: Since the prior study of 2017, the patient has had posterior decompression at L1-2 and L2-3. There is 2 mm of retrolisthesis presently at each of those 2 levels. There is bulging of the discs. No compressive narrowing of the canal. Bilateral foraminal narrowing is present at  those levels which could possibly affect the L1 and or L2 nerves.   L4-5 facet osteoarthritis with 2 mm of anterolisthesis. No compressive stenosis. No edematous change of the facet joints. No significant change since 2017.     Electronically  Signed   By: Paulina Fusi M.D.   On: 10/27/2021 15:00  MRI C spine 10/27/2021 IMPRESSION: Mild-to-moderate left-sided neural foraminal stenosis at C3-C4 and moderate to severe left-sided neural foraminal stenosis at C5-C6. No evidence of high-grade spinal canal stenosis.     Electronically Signed   By: Lorenza Cambridge M.D.   On: 10/27/2021 15:02    I have personally reviewed the images and agree with the above interpretation.  Assessment and Plan: Glenda Gomez is a pleasant 72 y.o. female with right-sided lower back pain that seems most consistent with sacroiliitis.  I would like to send her for a right sacroiliac joint injection.  If that does not help, we will repeat her MRI scan to see if there have been any changes since last September.    Thank you for involving me in the care of this patient.      Jaylun Fleener K. Myer Haff MD, [facility] Neurosurgery

## 2022-09-01 ENCOUNTER — Encounter: Payer: Self-pay | Admitting: Neurosurgery

## 2022-09-01 ENCOUNTER — Ambulatory Visit (INDEPENDENT_AMBULATORY_CARE_PROVIDER_SITE_OTHER): Payer: Medicare HMO | Admitting: Neurosurgery

## 2022-09-01 VITALS — BP 156/63 | HR 51 | Wt 237.0 lb

## 2022-09-01 DIAGNOSIS — M533 Sacrococcygeal disorders, not elsewhere classified: Secondary | ICD-10-CM

## 2022-09-01 DIAGNOSIS — M461 Sacroiliitis, not elsewhere classified: Secondary | ICD-10-CM | POA: Diagnosis not present

## 2022-09-06 ENCOUNTER — Ambulatory Visit
Admission: RE | Admit: 2022-09-06 | Discharge: 2022-09-06 | Disposition: A | Discharge status: Home / Self Care | Payer: Medicare HMO | Source: Ambulatory Visit | Attending: Internal Medicine | Admitting: Internal Medicine

## 2022-09-06 DIAGNOSIS — I10 Essential (primary) hypertension: Secondary | ICD-10-CM | POA: Insufficient documentation

## 2022-09-06 DIAGNOSIS — R001 Bradycardia, unspecified: Secondary | ICD-10-CM | POA: Insufficient documentation

## 2022-10-10 ENCOUNTER — Encounter: Payer: Self-pay | Admitting: Internal Medicine

## 2022-10-10 ENCOUNTER — Inpatient Hospital Stay: Payer: Medicare HMO

## 2022-10-10 ENCOUNTER — Inpatient Hospital Stay: Payer: Medicare HMO | Attending: Internal Medicine | Admitting: Internal Medicine

## 2022-10-10 VITALS — BP 183/72 | HR 58 | Temp 98.8°F | Ht 63.0 in | Wt 241.4 lb

## 2022-10-10 DIAGNOSIS — C50811 Malignant neoplasm of overlapping sites of right female breast: Secondary | ICD-10-CM | POA: Insufficient documentation

## 2022-10-10 DIAGNOSIS — E876 Hypokalemia: Secondary | ICD-10-CM | POA: Insufficient documentation

## 2022-10-10 DIAGNOSIS — Z79890 Hormone replacement therapy (postmenopausal): Secondary | ICD-10-CM | POA: Insufficient documentation

## 2022-10-10 DIAGNOSIS — Z85118 Personal history of other malignant neoplasm of bronchus and lung: Secondary | ICD-10-CM | POA: Insufficient documentation

## 2022-10-10 DIAGNOSIS — I10 Essential (primary) hypertension: Secondary | ICD-10-CM | POA: Insufficient documentation

## 2022-10-10 DIAGNOSIS — E039 Hypothyroidism, unspecified: Secondary | ICD-10-CM | POA: Insufficient documentation

## 2022-10-10 DIAGNOSIS — Z170 Estrogen receptor positive status [ER+]: Secondary | ICD-10-CM | POA: Insufficient documentation

## 2022-10-10 DIAGNOSIS — E669 Obesity, unspecified: Secondary | ICD-10-CM | POA: Diagnosis not present

## 2022-10-10 DIAGNOSIS — Z79811 Long term (current) use of aromatase inhibitors: Secondary | ICD-10-CM | POA: Diagnosis not present

## 2022-10-10 LAB — CBC WITH DIFFERENTIAL (CANCER CENTER ONLY)
Abs Immature Granulocytes: 0.03 10*3/uL (ref 0.00–0.07)
Basophils Absolute: 0 10*3/uL (ref 0.0–0.1)
Basophils Relative: 1 %
Eosinophils Absolute: 0.2 10*3/uL (ref 0.0–0.5)
Eosinophils Relative: 2 %
HCT: 42.5 % (ref 36.0–46.0)
Hemoglobin: 13.7 g/dL (ref 12.0–15.0)
Immature Granulocytes: 0 %
Lymphocytes Relative: 25 %
Lymphs Abs: 2.2 10*3/uL (ref 0.7–4.0)
MCH: 27.7 pg (ref 26.0–34.0)
MCHC: 32.2 g/dL (ref 30.0–36.0)
MCV: 85.9 fL (ref 80.0–100.0)
Monocytes Absolute: 0.8 10*3/uL (ref 0.1–1.0)
Monocytes Relative: 9 %
Neutro Abs: 5.4 10*3/uL (ref 1.7–7.7)
Neutrophils Relative %: 63 %
Platelet Count: 278 10*3/uL (ref 150–400)
RBC: 4.95 MIL/uL (ref 3.87–5.11)
RDW: 14.4 % (ref 11.5–15.5)
WBC Count: 8.6 10*3/uL (ref 4.0–10.5)
nRBC: 0 % (ref 0.0–0.2)

## 2022-10-10 LAB — CMP (CANCER CENTER ONLY)
ALT: 11 U/L (ref 0–44)
AST: 17 U/L (ref 15–41)
Albumin: 3.7 g/dL (ref 3.5–5.0)
Alkaline Phosphatase: 68 U/L (ref 38–126)
Anion gap: 7 (ref 5–15)
BUN: 15 mg/dL (ref 8–23)
CO2: 26 mmol/L (ref 22–32)
Calcium: 8.8 mg/dL — ABNORMAL LOW (ref 8.9–10.3)
Chloride: 100 mmol/L (ref 98–111)
Creatinine: 0.84 mg/dL (ref 0.44–1.00)
GFR, Estimated: >60 mL/min (ref >60)
Glucose, Bld: 99 mg/dL (ref 70–99)
Potassium: 3.8 mmol/L (ref 3.5–5.1)
Sodium: 133 mmol/L — ABNORMAL LOW (ref 135–145)
Total Bilirubin: 0.6 mg/dL (ref 0.3–1.2)
Total Protein: 6.6 g/dL (ref 6.5–8.1)

## 2022-10-10 NOTE — Progress Notes (Signed)
[facility] CONSULT NOTE  Patient Care Team: Barbette Reichmann, MD as PCP - General (Internal Medicine) Glory Buff, RN as Oncology Nurse Navigator Loreli Slot, MD as Consulting Physician (Cardiothoracic Surgery) Earna Coder, MD as Consulting Physician (Internal Medicine) Hulda Marin, MD (Inactive) as Referring Physician (Cardiothoracic Surgery) Scarlett Presto, RN (Inactive) as Oncology Nurse Navigator  CHIEF COMPLAINTS/PURPOSE OF CONSULTATION: lung cancer/ Breast cancer  #  Oncology History Overview Note  # JAN 2021- ADENOCA [Dr.A; ENB- jan 2021-]DEC 2020- 13 mm lobular somewhat branching right upper lobe lesion is not hypermetabolic.Number of Lymph Nodes Examined: 17 ; Pathologic Stage Classification (pTNM, AJCC 8th Edition): pT1c, pN0 [Dr.Henderickson]; STAGE I-adenocarcinoma; no adjuvant therapy  # Hx of barrets [[facility] -GI]  # Hx of garstic bypass [1970s]  SPECIMEN  Procedure: Lumpectomy  Specimen Laterality: Right   TUMOR  Histologic Type: Invasive mammary carcinoma, no special type (ductal)  Histologic Grade (Nottingham Histologic Score)                       Glandular (Acinar)/Tubular Differentiation: 1                       Nuclear Pleomorphism: 2                       Mitotic Rate: 1                       Overall Grade: 1  Tumor Size: 7 x 7 x 5 mm  Tumor Focality: Unifocal  Tumor Extent: Not applicable  Ductal Carcinoma In Situ (DCIS): Not identified  Lymphatic and/or Vascular Invasion: Not identified  Treatment Effect in the Breast: No known presurgical treatment         Malignant neoplasm of right upper lobe of lung (HCC)  03/08/2019 Initial Diagnosis   Malignant neoplasm of right upper lobe of lung (HCC)   04/03/2019 Cancer Staging   Staging form: Lung, AJCC 8th Edition - Clinical stage from 04/03/2019: ycT1, cN0, cM0 - Signed by Loreli Slot, MD on 04/03/2019   Carcinoma of overlapping sites of right breast in  female, estrogen receptor positive (HCC)  05/25/2021 Initial Diagnosis   Carcinoma of overlapping sites of right breast in female, estrogen receptor positive (HCC)   05/25/2021 Cancer Staging   Staging form: Breast, AJCC 8th Edition - Pathologic: Stage IA (pT1b, pN0, cM0, G1, ER+, PR+, HER2-) - Signed by Earna Coder, MD on 05/25/2021 Histologic grading system: 3 grade system    HISTORY OF PRESENTING ILLNESS: Ambulating independently.  Alone.  Glenda Gomez 72 y.o.  female with right upper lobe stage I lung cancer ; and stage I ER/PR positive breast cancer-s/p RT currently on anastrozole is here for follow-up.   Patient had a CT scan done, [facility],  on heart since last visit. Was told there was a nodule in right lung. Would like for you to take a look at the scan to see if the nodule is new.   Occasionally has trouble swallowing liquids. Chronic Occasional SOB with exertion.   Hot flashes not any worse.  Overall improved. Also complains of  back pain-likely due to arthritis in the back- s/p RT- completely resolved.   Review of Systems  Constitutional:  Positive for malaise/fatigue. Negative for chills, diaphoresis, fever and weight loss.  HENT:  Negative for nosebleeds and sore throat.   Eyes:  Negative for  double vision.  Respiratory:  Negative for hemoptysis, sputum production, shortness of breath and wheezing.   Cardiovascular:  Negative for chest pain, palpitations, orthopnea and leg swelling.  Gastrointestinal:  Negative for abdominal pain, blood in stool, constipation, diarrhea, heartburn, melena, nausea and vomiting.  Genitourinary:  Negative for dysuria, frequency and urgency.  Musculoskeletal:  Positive for back pain and joint pain.  Skin: Negative.  Negative for itching and rash.  Neurological:  Negative for tingling and focal weakness.  Endo/Heme/Allergies:  Does not bruise/bleed easily.  Psychiatric/Behavioral:  Negative for depression. The patient is not  nervous/anxious and does not have insomnia.      MEDICAL HISTORY:  Past Medical History:  Diagnosis Date   Adenocarcinoma (HCC)    right upper lobe   Arthritis    Chicken pox    Dyspnea    Environmental and seasonal allergies    Fibromyalgia    GERD (gastroesophageal reflux disease)    Hemorrhoid    Hypersomnia    Hypertension    Hypothyroidism    Lung cancer (HCC)    Malignant neoplasm of right upper lobe of lung (HCC) 03/08/2019   Myositis ossificans    Osteoarthritis    Polycythemia 07/16/2021   dx by Dr. Marcello Fennel   Thyroid disease     SURGICAL HISTORY: Past Surgical History:  Procedure Laterality Date    bengin tongue lesion     Benign 1980's   ABDOMINAL HYSTERECTOMY  1979   BACK SURGERY     bladder tack  2011   BREAST BIOPSY Left    neg   BREAST BIOPSY Right 02/22/2021   u/s bc ribbon 8:00 path pending   BREAST LUMPECTOMY WITH SENTINEL LYMPH NODE BIOPSY Right 03/05/2021   Procedure: BREAST LUMPECTOMY WITH SENTINEL LYMPH NODE BX;  Surgeon: Earline Mayotte, MD;  Location: [facility];  Service: General;  Laterality: Right;   CHOLECYSTECTOMY  2010   COLONOSCOPY  12/22/2011   COLONOSCOPY WITH PROPOFOL N/A 03/24/2017   Procedure: COLONOSCOPY WITH PROPOFOL;  Surgeon: Christena Deem, MD;  Location: [facility];  Service: Endoscopy;  Laterality: N/A;   COLONOSCOPY WITH PROPOFOL N/A 07/22/2020   Procedure: COLONOSCOPY WITH PROPOFOL;  Surgeon: Toledo, Boykin Nearing, MD;  Location: [facility];  Service: Gastroenterology;  Laterality: N/A;   ESOPHAGOGASTRODUODENOSCOPY (EGD) WITH PROPOFOL N/A 03/24/2017   Procedure: ESOPHAGOGASTRODUODENOSCOPY (EGD) WITH PROPOFOL;  Surgeon: Christena Deem, MD;  Location: [facility];  Service: Endoscopy;  Laterality: N/A;   ESOPHAGOGASTRODUODENOSCOPY (EGD) WITH PROPOFOL N/A 07/22/2020   Procedure: ESOPHAGOGASTRODUODENOSCOPY (EGD) WITH PROPOFOL;  Surgeon: Toledo, Boykin Nearing, MD;  Location: [facility] ENDOSCOPY;  Service: Gastroenterology;   Laterality: N/A;   GASTRIC BYPASS OPEN     1970's complicated by herniated stomach requiring reversal with mesh   HEMORRHOID SURGERY     INTERCOSTAL NERVE BLOCK Right 03/29/2019   Procedure: Intercostal Nerve Block;  Surgeon: Loreli Slot, MD;  Location: [facility] OR;  Service: Thoracic;  Laterality: Right;   JOINT REPLACEMENT Bilateral    TKR   KNEE ARTHROPLASTY Right 07/06/2016   Procedure: COMPUTER ASSISTED TOTAL KNEE ARTHROPLASTY;  Surgeon: Donato Heinz, MD;  Location: [facility] OR[facility];  Service: Orthopedics;  Laterality: Right;   KNEE ARTHROSCOPY Left 10/19/2006   x 3   KNEE ARTHROSCOPY Right    x2   LOBECTOMY Right 03/29/2019   XI ROBOTIC ASSISTED THORASCOPY- RIGHT UPPER LOBECTOMY (Right Chest)   NODE DISSECTION  03/29/2019   Procedure: Node Dissection;  Surgeon: Loreli Slot, MD;  Location: [facility] OR;  Service: Thoracic;;   plantar fascia Left    PLANTAR FASCIA RELEASE Right    x 3   POSTERIOR LAMINECTOMY / DECOMPRESSION LUMBAR SPINE  06/24/2015   SHOULDER ARTHROSCOPY WITH DEBRIDEMENT AND BICEP TENDON REPAIR Left 09/25/2018   Procedure: SHOULDER ARTHROSCOPY WITH DEBRIDEMENT DECOMPRESSION AND BICEP TENDON REPAIR - SCRUB TECH;  Surgeon: Christena Flake, MD;  Location: [facility];  Service: Orthopedics;  Laterality: Left;   TONSILLECTOMY     childhood   TOTAL KNEE ARTHROPLASTY Left 11/17/2004   VIDEO BRONCHOSCOPY WITH ENDOBRONCHIAL NAVIGATION N/A 03/04/2019   Procedure: VIDEO BRONCHOSCOPY WITH ENDOBRONCHIAL NAVIGATION;  Surgeon: Vida Rigger, MD;  Location: [facility];  Service: Thoracic;  Laterality: N/A;   VIDEO BRONCHOSCOPY WITH ENDOBRONCHIAL ULTRASOUND N/A 03/04/2019   Procedure: VIDEO BRONCHOSCOPY WITH ENDOBRONCHIAL ULTRASOUND;  Surgeon: Vida Rigger, MD;  Location: [facility];  Service: Thoracic;  Laterality: N/A;    SOCIAL HISTORY: Social History   Socioeconomic History   Marital status: Divorced    Spouse name: Not on file   Number of children: Not on file   Years  of education: Not on file   Highest education level: Not on file  Occupational History   Not on file  Tobacco Use   Smoking status: Former    Current packs/day: 0.00    Average packs/day: 2.0 packs/day for 10.0 years (20.0 ttl pk-yrs)    Types: Cigarettes    Start date: 07/18/1983    Quit date: 07/17/1993    Years since quitting: 29.2   Smokeless tobacco: Never  Vaping Use   Vaping status: Never Used  Substance and Sexual Activity   Alcohol use: No   Drug use: No   Sexual activity: Not on file  Other Topics Concern   Not on file  Social History Narrative   1ppd- quit > 20 years ago; no alcohol; in [place]; lives self. retd- cook.    Social Determinants of Health   Financial Resource Strain: Low Risk  (08/17/2022)   Received from [facility]   Overall Financial Resource Strain (CARDIA)    Difficulty of Paying Living Expenses: Not hard at all  Food Insecurity: No Food Insecurity (08/17/2022)   Received from [facility]   Hunger Vital Sign    Worried About Running Out of Food in the Last Year: Never true    Ran Out of Food in the Last Year: Never true  Transportation Needs: No Transportation Needs (08/17/2022)   Received [facility] - Transportation    In the past 12 months, has lack of transportation kept you from medical appointments or from getting medications?: No    Lack of Transportation (Non-Medical): No  Physical Activity: Not on file  Stress: Not on file  Social Connections: Not on file  Intimate Partner Violence: Not on file    FAMILY HISTORY: Family History  Problem Relation Age of Onset   Breast cancer Sister 4   Breast cancer Maternal Aunt 47   Lung cancer Maternal Aunt    Breast cancer Sister 68   Alzheimer's disease Mother    Arthritis Mother    Diabetes Mother    Heart failure Brother    Pancreatic cancer Brother 47   Lung cancer Brother    Liver cancer Father     ALLERGIES:  is  allergic to flagyl [metronidazole], phentermine, and pregabalin.  MEDICATIONS:  Current Outpatient Medications  Medication Sig Dispense Refill   anastrozole (ARIMIDEX) 1 MG tablet  TAKE 1 TABLET(1 MG) BY MOUTH DAILY 90 tablet 1   baclofen (LIORESAL) 10 MG tablet Take 10 mg by mouth 2 (two) times daily as needed.     celecoxib (CELEBREX) 200 MG capsule Take 1 capsule (200 mg total) by mouth 2 (two) times daily with a meal. 60 capsule 1   FLUoxetine (PROZAC) 10 MG capsule Take 10 mg by mouth daily.     furosemide (LASIX) 20 MG tablet Take 20 mg by mouth daily as needed for fluid or edema.     levothyroxine (SYNTHROID) 88 MCG tablet Take 88 mcg by mouth daily before breakfast.     losartan (COZAAR) 50 MG tablet Take 50 mg by mouth daily.     omeprazole (PRILOSEC) 20 MG capsule Take 20 mg by mouth 2 (two) times daily before a meal.      oxyCODONE-acetaminophen (PERCOCET/ROXICET) 5-325 MG tablet Take 1 tablet by mouth every 4 (four) hours as needed.     triamterene-hydrochlorothiazide (MAXZIDE-25) 37.5-25 MG tablet Take 1 tablet by mouth daily.     Vitamin D, Ergocalciferol, (DRISDOL) 1.25 MG (50000 UNIT) CAPS capsule Take 50,000 Units by mouth once a week. Friday     zolpidem (AMBIEN) 5 MG tablet Take 5-7.5 mg by mouth at bedtime.     predniSONE (STERAPRED UNI-PAK 48 TAB) 10 MG (48) TBPK tablet Take by mouth as needed. (Patient not taking: Reported on 10/10/2022)     No current facility-administered medications for this visit.   PHYSICAL EXAMINATION: ECOG PERFORMANCE STATUS: 0 - Asymptomatic  Vitals:   10/10/22 1057  BP: (!) 183/72  Pulse: (!) 58  Temp: 98.8 F (37.1 C)  SpO2: 99%   Filed Weights   10/10/22 1057  Weight: 241 lb 6.4 oz (109.5 kg)    Physical Exam Constitutional:      Comments: Obese. She is walking herself.  HENT:     Head: Normocephalic and atraumatic.     Mouth/Throat:     Pharynx: No oropharyngeal exudate.  Eyes:     Pupils: Pupils are equal, round, and  reactive to light.  Cardiovascular:     Rate and Rhythm: Normal rate and regular rhythm.  Pulmonary:     Effort: Pulmonary effort is normal. No respiratory distress.     Breath sounds: Normal breath sounds. No wheezing.  Abdominal:     General: Bowel sounds are normal. There is no distension.     Palpations: Abdomen is soft. There is no mass.     Tenderness: There is no abdominal tenderness. There is no guarding or rebound.  Musculoskeletal:        General: No tenderness. Normal range of motion.     Cervical back: Normal range of motion and neck supple.  Skin:    General: Skin is warm.  Neurological:     Mental Status: She is alert and oriented to person, place, and time.  Psychiatric:        Mood and Affect: Affect normal.    LABORATORY DATA:  I have reviewed the data as listed Lab Results  Component Value Date   WBC 8.6 10/10/2022   HGB 13.7 10/10/2022   HCT 42.5 10/10/2022   MCV 85.9 10/10/2022   PLT 278 10/10/2022   Recent Labs    12/06/21 0944 04/11/22 1028 10/10/22 1042  NA 137 136 133*  K 3.4* 3.8 3.8  CL 100 101 100  CO2 29 25 26   GLUCOSE 113* 111* 99  BUN 12 12 15   CREATININE 0.76  0.86 0.84  CALCIUM 9.2 9.3 8.8*  GFRNONAA >60 >60 >60  PROT  --   --  6.6  ALBUMIN  --   --  3.7  AST  --   --  17  ALT  --   --  11  ALKPHOS  --   --  68  BILITOT  --   --  0.6    RADIOGRAPHIC STUDIES: I have personally reviewed the radiological images as listed and agreed with the findings in the report. No results found.   ASSESSMENT & PLAN:   Carcinoma of overlapping sites of right breast in female, estrogen receptor positive (HCC) #Right breast invasive mammary carcinoma -PT1BN0 ER/PR positive HER2 negative- INVASIVE MAMMARY CARCINOMA, NO SPECIAL TYPE; grade 1; given low risk-okay to hold off Oncotype; s/p RT- Tolerating fairly well. Mammo [Dr.Cintron]- DEc 2023- WNL.   # Continue anastrozole at this time.  Patient tolerating fairly well except for hot flashes-not  any worse.stable.   # Hot flashes - resolved.   # HTN- systolic BP 180; at home 130 as per pt. monitor for now [facility] coat]- at home 130s- stable.   # RIGHT UPPER LOBE LUNG CA-stage I-status post resection clear margins-JUNE 2023--stable exam no evidence of any progressive disease/recurrent disease. CT coronary- stable lung nodules; Annual CT scan for 5 years. Will order CT scan at next visit.   # screening for Osteoporosis: JUNE 2023- BMD measured at [facility] Neck -T-score of 0.0. on Vit D; add calcium- stable.   # Obesity  [s/p gastric by pass][stopped Ozempic because of issues]-monitor for now.  # Hypothyroidism- on synthroid [PCP]  # mild hypokalemia: likely secondary to triamterene hydrochlorothiazide.  Recommend increased dietary intake of potassium rich foods.stable.    # DISPOSITION: # follow up in 6 months-  MD;labs- cbc/bmp;vit D-25-OH, B12 levels-  Dr.B   All questions were answered. The patient knows to call the clinic with any problems, questions or concerns.    Earna Coder, MD 10/10/2022 12:28 PM

## 2022-10-10 NOTE — Progress Notes (Signed)
Had a CT scan done, [facility],  on heart since last visit. Was told there was a nodule in right lung. Would like for you to take a look at the scan to see if the nodule is new.  Occasionally has trouble swallowing liquids.  Occasional SOB with exertion.

## 2022-10-10 NOTE — Assessment & Plan Note (Addendum)
#  Right breast invasive mammary carcinoma -PT1BN0 ER/PR positive HER2 negative- INVASIVE MAMMARY CARCINOMA, NO SPECIAL TYPE; grade 1; given low risk-okay to hold off Oncotype; s/p RT- Tolerating fairly well. Mammo [Dr.Cintron]- DEc 2023- WNL.   # Continue anastrozole at this time.  Patient tolerating fairly well except for hot flashes-not any worse.stable.   # Hot flashes - resolved.   # HTN- systolic BP 180; at home 130 as per pt. monitor for now [facility] coat]- at home 130s- stable.   # RIGHT UPPER LOBE LUNG CA-stage I-status post resection clear margins-JUNE 2023--stable exam no evidence of any progressive disease/recurrent disease. CT coronary- stable lung nodules; Annual CT scan for 5 years. Will order CT scan at next visit.   # screening for Osteoporosis: JUNE 2023- BMD measured at [facility] Neck -T-score of 0.0. on Vit D; add calcium- stable.   # Obesity  [s/p gastric by pass][stopped Ozempic because of issues]-monitor for now.  # Hypothyroidism- on synthroid [PCP]  # mild hypokalemia: likely secondary to triamterene hydrochlorothiazide.  Recommend increased dietary intake of potassium rich foods.stable.    # DISPOSITION: # follow up in 6 months-  MD;labs- cbc/bmp;vit D-25-OH, B12 levels-  Dr.B

## 2022-10-26 NOTE — Progress Notes (Signed)
 Established Patient Visit   Chief Complaint: Chief Complaint  Patient presents with  . 55month follow up    Date of Service: 10/27/2022 Date of Birth: 1950-09-22 PCP: Sadie Tamra Cal, MD  History of Present Illness: Glenda Gomez is a 72 y.o.female patient who presents for a 2 months follow up. PMH significant for hypertension, PAF, aortic atherosclerosis, chest pain with moderate risk for cardiac etiology, morbid obesity, and hypothyroidism.  Denies any cardiac complaints at this visit. Reports reason for cardiology referral was due to slow HR of 45 bpm. Denies hx of smoking.  Past Medical and Surgical History  Past Medical History Past Medical History:  Diagnosis Date  . Arthritis   . Breast cancer of lower-outer quadrant of right female breast (CMS/HHS-HCC) 03/05/2021   7 mm IMC, 0/6 nodes, pT1b,N0. ER/PR positive, Her 2 neu negative, 2 mm minimal margin.DECLINED RADIATION THERAPY (03/23/2021) note by Dr. Rennie.  . Calculus of gallbladder with other cholecystitis, without mention of obstruction   . Chicken pox   . Colon polyp   . Environmental allergies   . Essential hypertension, benign   . Fibromyalgia   . GERD (gastroesophageal reflux disease)   . Glaucoma (increased eye pressure)   . Hemorrhoid   . 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 Myositis ossificans    chronic pain, both feet  . Osteoarthritis   . Other and unspecified angina pectoris (CMS-HCC)   . Thyroid disease   . Unspecified hypothyroidism     Past Surgical History She has a past surgical history that includes Cholecystectomy (2010); Gastric bypass open (1970s); colonoscopy (12/22/2011); Tonsillectomy; HEMRRHOIDECTOMY; LEFT PLANTAR FASCIA RELEASE (Left); BLADDER TAC (2011); Abdominal hysterectomy (1979); EXCISION OF BENIGN TONGUE LESION  (1980s); laminectomy posterior lumbar facetectomy & foraminotomy w/decomp (Bilateral, 06/24/2015); back surgery (07/17/2015); Left total  knee arthroplasty (11/17/2004); Left knee arthrotomy, extensive synovectomy, and polyethylene exchange (10/19/2006); Left knee arthroscopy; Right knee arthroscopy ; Right plantar fascia release ; Right total knee arthroplasty using computer-assisted navigation (07/06/2016); Colonoscopy (03/24/2017); egd (03/24/2017); Colonoscopy; Upper gastrointestinal endoscopy; Extensive arthroscopic debridement with biceps tenolysis and arthroscopic subacromial decompression, left shoulder. (Left, 09/25/2018); Spine surgery; Joint replacement; Colonoscopy (07/22/2020); egd (07/22/2020); ultrasound guided breast biopsy (Right, 02/22/2021); thorascopy and lobectomy (Right, 03/29/2019); video bronchoscopy with endobronchial navigation (03/04/2019); Breast excisional biopsy (Left); and Mastectomy partial / lumpectomy (Right, 03/05/2021).   Medications and Allergies  Current Medications  Current Outpatient Medications  Medication Sig Dispense Refill  . albuterol  90 mcg/actuation inhaler INHALE 2 PUFFS BY MOUTH EVERY 6 HOURS AS NEEDED FOR WHEEZING 54 g 1  . anastrozole  (ARIMIDEX ) 1 mg tablet Take 1 mg by mouth once daily    . ascorbic acid, vitamin C, (VITAMIN C) 1000 MG tablet Take 1,000 mg by mouth once daily    . aspirin 81 MG EC tablet Take 81 mg by mouth once daily    . baclofen (LIORESAL) 10 MG tablet TAKE 1/2 TO 1 TABLET BY MOUTH TWICE DAILY AS NEEDED 60 tablet 2  . celecoxib  (CELEBREX ) 200 MG capsule TAKE 1 CAPSULE(200 MG) BY MOUTH TWICE DAILY 180 capsule 1  . FLUoxetine (PROZAC) 10 MG capsule Take 1 capsule (10 mg total) by mouth once daily 90 capsule 1  . FUROsemide  (LASIX ) 20 MG tablet TAKE 1 TABLET(20 MG) BY MOUTH EVERY DAY AS NEEDED FOR SWELLING 90 tablet 1  . levothyroxine  (SYNTHROID ) 88 MCG tablet TAKE 1 TABLET(88 MCG) BY MOUTH EVERY DAY 30 TO 60 MINUTES BEFORE BREAKFAST ON AN EMPTY  STOMACH AND WITH A GLASS OF WATER  90 tablet 1  . losartan  (COZAAR ) 50 MG tablet TAKE 1 TABLET(50 MG) BY MOUTH EVERY DAY 90  tablet 1  . omeprazole (PRILOSEC) 20 MG DR capsule TAKE 2 CAPSULES(40 MG) BY MOUTH EVERY DAY 180 capsule 1  . oxyCODONE -acetaminophen  (PERCOCET) 5-325 mg tablet 1/2 bid prn 20 tablet 0  . predniSONE (DELTASONE) 10 MG tablet Take as directed - 12 day taper 48 tablet 0  . traMADoL  (ULTRAM ) 50 mg tablet 1/2-1 po bid prn 10 tablet 0  . triamterene -hydroCHLOROthiazide  (MAXZIDE -25) 37.5-25 mg tablet take 1 tablet by mouth every day 90 tablet 1  . zolpidem  (AMBIEN ) 5 MG tablet TAKE 1 AND 1/2 TABLETS(7.5 MG) BY MOUTH EVERY NIGHT AS NEEDED FOR SLEEP 45 tablet 1  . ergocalciferol , vitamin D2, 1,250 mcg (50,000 unit) capsule Take 1 capsule (50,000 Units total) by mouth every 7 (seven) days for 180 days 12 capsule 1   No current facility-administered medications for this visit.    Allergies: Phentermine, Flagyl [metronidazole hcl], and Lyrica [pregabalin]  Social and Family History  Social History  reports that she quit smoking about 29 years ago. Her smoking use included cigarettes. She started smoking about 37 years ago. She has a 20 pack-year smoking history. She has never used smokeless tobacco. She reports that she does not currently use alcohol. She reports that she does not use drugs.  Family History Family History  Problem Relation Name Age of Onset  . Alzheimer's disease Mother    . Arthritis Mother    . Diabetes Mother    . Dementia Mother    . Cancer Father    . Liver cancer Father    . Breast cancer Sister  79  . Cancer Sister    . Breast cancer Sister  19  . Myocardial Infarction (Heart attack) Brother    . Cancer Brother    . Pancreatic cancer Brother    . Lung cancer Brother    . Breast cancer Maternal Aunt  80  . Lung cancer Maternal Aunt    . Anesthesia problems Neg Hx    . Colon cancer Neg Hx      Review of Systems   Pertinent positives and negatives are mentioned above in HPI and all other systems are negative.  Physical Examination   Vitals:BP (!) 140/70   Pulse  60   Resp 16   Ht 160 cm (5' 3)   Wt (!) 108.9 kg (240 lb)   LMP  (LMP Unknown)   SpO2 98%   BMI 42.51 kg/m  Ht:160 cm (5' 3) Wt:(!) 108.9 kg (240 lb) ADJ:Anib surface area is 2.2 meters squared. Body mass index is 42.51 kg/m.  HEENT: Pupils equally reactive to light and accomodation  Neck: Supple without thyromegaly, carotid pulses 2+ Lungs: clear to auscultation bilaterally; no wheezes, rales, rhonchi Heart: Regular rate and rhythm.  No gallops, murmurs or rub Abdomen: soft nontender, nondistended, with normal bowel sounds Extremities: no cyanosis, clubbing, or edema Peripheral Pulses: 2+ in all extremities, 2+ femoral pulses bilaterally Neurologic: Alert and oriented X3; speech intact; face symmetrical; moves all extremities well  Cardiovascular Studies:    72-hour Holter Indication bradycardia Hookup date January Levan 2 February 27, 2022 Patient wore the Holter for about 3 days   Total beats 212,312 Minimum rate is 32 maximum 127 average 50 No significant PVCs  Rare PACs No runs No pauses No high-grade blocks No ST segment changes No diary submitted No evidence  of atrial fibrillation   Conclusion Benign Holter  Echocardiogram 2D complete: 09/14/22  INTERPRETATION NORMAL LEFT VENTRICULAR SYSTOLIC FUNCTION NORMAL RIGHT VENTRICULAR SYSTOLIC FUNCTION MODERATE VALVULAR REGURGITATION (See above) NO VALVULAR STENOSIS ESTIMATED LVEF: >55% MODERATE TR; MILD PHTN (3.62m/s; RVSP: )  CT cardiac scoring: 09/06/22  IMPRESSION AND RECOMMENDATION:  1. Coronary calcium score of 90.6. This was 69th percentile for age  and sex matched control.   2. CAC 1-99 in RCA, LCx. CAC-DRS A1/N2.   3. Continue heart healthy lifestyle and risk factor modification.   Assessment   72 y.o. female with  1. Bradycardia   2. Need for vaccination   3. PAF (paroxysmal atrial fibrillation) (CMS/HHS-HCC)   4. Essential hypertension   5. Morbid obesity with BMI of 40.0-44.9, adult  (CMS/HHS-HCC)   6. Thyroid disease   7. Chronic fatigue    Plan  H/o PAF, s/p right lobectomy 03/2019, No need for pacemaker at this time. Bradycardia, stable, heart rate today is 60 bpm, consider further treatment is symptoms worsen. Recent 72 hr Holter shows no evidence of atrial fibrillation.   Recent CT cardiac scoring reveals calcium score of 90.6, continue heart healthy lifestyle and risk factor modification.  Obesity, recommend weight loss, exercise, and portion control.  Hypertension, BP today 140/70, continue current medical therapy. SOB, recent echo shows EF LVEF >55%. Thyroid disease, recent lab workup shows FT4; 1.26 ng/dL.  Have patient follow up in 1 year.  Return in about 1 year (around 10/27/2023).  This note is partially written by Jodee Alken, in the presence of and acting as the scribe of Dr. Cara Lovelace.      Kinda Musa  I have reviewed, edited and added to the note to reflect my best personal medical judgment.  Attestation Statement:   I personally performed the service. (TP)  DWAYNE JONETTA LOVELACE, MD  [facility] [place], [place] Ph:  [phone] Fax:  [phone] This note was generated in part with voice recognition software, Dragon.  I apologize for any typographical errors that were not detected and corrected from this process.  They are unintentional.

## 2022-11-01 ENCOUNTER — Telehealth: Payer: Self-pay | Admitting: Neurosurgery

## 2022-11-01 NOTE — Telephone Encounter (Signed)
I spoke with Glenda Gomez. I informed her that Dr Myer Haff recommends a 2nd right SI joint injection because if she gets greater than 50% relief with the 2nd injection, she may be a candidate for a SI joint infusion. I have asked her to pay close attention to her symptoms and relief after the 2nd injection, and to update [place] a couple of weeks after to determine the next steps.

## 2022-11-01 NOTE — Telephone Encounter (Signed)
Dr.Chasnis is referring the patient back to Dr.Yarbrough. Patient states that when she did have an an injection on 10/04/2022 she was pain free for 2 weeks, she felt great. After 2 weeks the pain starting coming back and is increasing daily. She has f/u with Dr.Chasnis on 11/07/2022. She is not sure why Dr.Chasnis has referred her back so soon to Dr.Yarbrough, but if she needs to see him she will.  Dr.Yarbrough's plan 09/01/2022: I would like to send her for a right sacroiliac joint injection. If that does not help, we will repeat her MRI scan to see if there have been any changes since last September   What would he like to do?

## 2022-11-20 ENCOUNTER — Other Ambulatory Visit: Payer: Self-pay | Admitting: Internal Medicine

## 2022-12-07 ENCOUNTER — Ambulatory Visit: Payer: Medicare HMO | Attending: Radiation Oncology | Admitting: Radiation Oncology

## 2022-12-08 ENCOUNTER — Telehealth: Payer: Self-pay | Admitting: Neurosurgery

## 2022-12-08 DIAGNOSIS — M533 Sacrococcygeal disorders, not elsewhere classified: Secondary | ICD-10-CM

## 2022-12-08 NOTE — Telephone Encounter (Signed)
Patient completed injections with Dr.Chasnis. Patient discussed with Dr.Yarbrough about sacroiliac surgery and he mentioned he did not do that but could refer the patient to someone who does. Patient would like for that referral to be put in, please advise.

## 2022-12-08 NOTE — Telephone Encounter (Signed)
I spoke with Glenda Gomez. She confirms that she received greater than 50% relief with each injection. I offered her a referral to all 3 surgeons. She does not want to travel to [place], and therefore chose Dr Emogene Morgan. Referral has been placed and an outside message has been sent to Dr Emogene Morgan. I advised her to reach out to [place] if she doesn't hear from them in a couple of weeks. I also gave her their #.

## 2022-12-15 ENCOUNTER — Other Ambulatory Visit: Payer: Self-pay | Admitting: General Surgery

## 2022-12-15 DIAGNOSIS — Z853 Personal history of malignant neoplasm of breast: Secondary | ICD-10-CM

## 2023-02-06 ENCOUNTER — Ambulatory Visit
Admission: RE | Admit: 2023-02-06 | Discharge: 2023-02-06 | Disposition: A | Discharge status: Home / Self Care | Payer: Medicare HMO | Source: Ambulatory Visit | Attending: General Surgery | Admitting: General Surgery

## 2023-02-06 DIAGNOSIS — Z853 Personal history of malignant neoplasm of breast: Secondary | ICD-10-CM | POA: Insufficient documentation

## 2023-02-10 ENCOUNTER — Telehealth: Payer: Self-pay | Admitting: *Deleted

## 2023-02-10 NOTE — Telephone Encounter (Signed)
Patient called to request that Dr. Racheal Patches review her bone scan from [facility] to give his opinion. She is quite concerned that she is having a reoccurrence of her cancer. She has an appointment with Dr. Racheal Patches on 04/12/2023 but did not want to wait until then.

## 2023-02-20 ENCOUNTER — Telehealth: Payer: Self-pay | Admitting: Internal Medicine

## 2023-02-20 NOTE — Telephone Encounter (Signed)
 Spoke to pat re: SPECT dec 18th, 2024- recent left T11-T12 and left T 12-L1-uptake noted.  Sacroiliac joint none-patient's pain in the lower back.  Left a message to Dr.Abl-Barr at [facility].  Will call patient once again to speak to surgeon at [facility].

## 2023-02-21 ENCOUNTER — Telehealth: Payer: Self-pay | Admitting: Internal Medicine

## 2023-02-21 NOTE — Telephone Encounter (Signed)
 Spoke to Dr. Hewitt Blade neurosurgery.  No clinical concerns of malignancy noted on imaging-bone scan. He has spoken to pt re the results.  Please inform patient of above-and follow-up as planned.  GB

## 2023-04-12 ENCOUNTER — Encounter: Payer: Self-pay | Admitting: Internal Medicine

## 2023-04-12 ENCOUNTER — Inpatient Hospital Stay: Payer: Medicare HMO | Attending: Internal Medicine

## 2023-04-12 ENCOUNTER — Inpatient Hospital Stay (HOSPITAL_BASED_OUTPATIENT_CLINIC_OR_DEPARTMENT_OTHER): Payer: Medicare HMO | Admitting: Internal Medicine

## 2023-04-12 VITALS — BP 151/78 | HR 49 | Temp 98.5°F | Ht 63.0 in | Wt 245.8 lb

## 2023-04-12 DIAGNOSIS — E039 Hypothyroidism, unspecified: Secondary | ICD-10-CM | POA: Insufficient documentation

## 2023-04-12 DIAGNOSIS — Z170 Estrogen receptor positive status [ER+]: Secondary | ICD-10-CM | POA: Insufficient documentation

## 2023-04-12 DIAGNOSIS — Z85118 Personal history of other malignant neoplasm of bronchus and lung: Secondary | ICD-10-CM | POA: Insufficient documentation

## 2023-04-12 DIAGNOSIS — C50811 Malignant neoplasm of overlapping sites of right female breast: Secondary | ICD-10-CM | POA: Diagnosis not present

## 2023-04-12 DIAGNOSIS — E876 Hypokalemia: Secondary | ICD-10-CM | POA: Insufficient documentation

## 2023-04-12 DIAGNOSIS — C3411 Malignant neoplasm of upper lobe, right bronchus or lung: Secondary | ICD-10-CM

## 2023-04-12 DIAGNOSIS — I10 Essential (primary) hypertension: Secondary | ICD-10-CM | POA: Insufficient documentation

## 2023-04-12 DIAGNOSIS — Z79811 Long term (current) use of aromatase inhibitors: Secondary | ICD-10-CM | POA: Insufficient documentation

## 2023-04-12 LAB — BASIC METABOLIC PANEL
Anion gap: 8 (ref 5–15)
BUN: 13 mg/dL (ref 8–23)
CO2: 27 mmol/L (ref 22–32)
Calcium: 9.3 mg/dL (ref 8.9–10.3)
Chloride: 101 mmol/L (ref 98–111)
Creatinine, Ser: 0.9 mg/dL (ref 0.44–1.00)
GFR, Estimated: >60 mL/min (ref >60)
Glucose, Bld: 99 mg/dL (ref 70–99)
Potassium: 4.1 mmol/L (ref 3.5–5.1)
Sodium: 136 mmol/L (ref 135–145)

## 2023-04-12 LAB — CBC WITH DIFFERENTIAL (CANCER CENTER ONLY)
Abs Immature Granulocytes: 0.02 10*3/uL (ref 0.00–0.07)
Basophils Absolute: 0.1 10*3/uL (ref 0.0–0.1)
Basophils Relative: 1 %
Eosinophils Absolute: 0.1 10*3/uL (ref 0.0–0.5)
Eosinophils Relative: 2 %
HCT: 41.4 % (ref 36.0–46.0)
Hemoglobin: 13.4 g/dL (ref 12.0–15.0)
Immature Granulocytes: 0 %
Lymphocytes Relative: 30 %
Lymphs Abs: 1.7 10*3/uL (ref 0.7–4.0)
MCH: 27.2 pg (ref 26.0–34.0)
MCHC: 32.4 g/dL (ref 30.0–36.0)
MCV: 84.1 fL (ref 80.0–100.0)
Monocytes Absolute: 0.5 10*3/uL (ref 0.1–1.0)
Monocytes Relative: 8 %
Neutro Abs: 3.3 10*3/uL (ref 1.7–7.7)
Neutrophils Relative %: 59 %
Platelet Count: 289 10*3/uL (ref 150–400)
RBC: 4.92 MIL/uL (ref 3.87–5.11)
RDW: 13.8 % (ref 11.5–15.5)
WBC Count: 5.7 10*3/uL (ref 4.0–10.5)
nRBC: 0 % (ref 0.0–0.2)

## 2023-04-12 LAB — VITAMIN D 25 HYDROXY (VIT D DEFICIENCY, FRACTURES): Vit D, 25-Hydroxy: 25.6 ng/mL — ABNORMAL LOW (ref 30–100)

## 2023-04-12 LAB — VITAMIN B12: Vitamin B-12: 404 pg/mL (ref 180–914)

## 2023-04-12 NOTE — Progress Notes (Signed)
 [facility] CONSULT NOTE  Patient Care Team: Barbette Reichmann, MD as PCP - General (Internal Medicine) Glory Buff, RN as Oncology Nurse Navigator Loreli Slot, MD as Consulting Physician (Cardiothoracic Surgery) Earna Coder, MD as Consulting Physician (Internal Medicine) Hulda Marin, MD (Inactive) as Referring Physician (Cardiothoracic Surgery) Scarlett Presto, RN (Inactive) as Oncology Nurse Navigator  CHIEF COMPLAINTS/PURPOSE OF CONSULTATION: lung cancer/ Breast cancer  #  Oncology History Overview Note  # JAN 2021- ADENOCA [Dr.A; ENB- jan 2021-]DEC 2020- 13 mm lobular somewhat branching right upper lobe lesion is not hypermetabolic.Number of Lymph Nodes Examined: 17 ; Pathologic Stage Classification (pTNM, AJCC 8th Edition): pT1c, pN0 [Dr.Henderickson]; STAGE I-adenocarcinoma; no adjuvant therapy  # Hx of barrets [[facility] -GI]  # Hx of garstic bypass [1970s]  SPECIMEN  Procedure: Lumpectomy  Specimen Laterality: Right   TUMOR  Histologic Type: Invasive mammary carcinoma, no special type (ductal)  Histologic Grade (Nottingham Histologic Score)                       Glandular (Acinar)/Tubular Differentiation: 1                       Nuclear Pleomorphism: 2                       Mitotic Rate: 1                       Overall Grade: 1  Tumor Size: 7 x 7 x 5 mm  Tumor Focality: Unifocal  Tumor Extent: Not applicable  Ductal Carcinoma In Situ (DCIS): Not identified  Lymphatic and/or Vascular Invasion: Not identified  Treatment Effect in the Breast: No known presurgical treatment         Malignant neoplasm of right upper lobe of lung (HCC)  03/08/2019 Initial Diagnosis   Malignant neoplasm of right upper lobe of lung (HCC)   04/03/2019 Cancer Staging   Staging form: Lung, AJCC 8th Edition - Clinical stage from 04/03/2019: ycT1, cN0, cM0 - Signed by Loreli Slot, MD on 04/03/2019   Carcinoma of overlapping sites of right breast in  female, estrogen receptor positive (HCC)  05/25/2021 Initial Diagnosis   Carcinoma of overlapping sites of right breast in female, estrogen receptor positive (HCC)   05/25/2021 Cancer Staging   Staging form: Breast, AJCC 8th Edition - Pathologic: Stage IA (pT1b, pN0, cM0, G1, ER+, PR+, HER2-) - Signed by Earna Coder, MD on 05/25/2021 Histologic grading system: 3 grade system    HISTORY OF PRESENTING ILLNESS: Ambulating independently.  Alone.  Glenda Gomez 73 y.o.  female with right upper lobe stage I lung cancer ; and stage I ER/PR positive breast cancer-s/p RT currently on anastrozole is here for follow-up.   Patient currently is awaiting SI fusion at [facility] in 2 months and MRI.  Currently compliant with anastrozole.  Patient continues to have multiple joint-hx of arthritis.   Hot flashes not any worse.  Review of Systems  Constitutional:  Positive for malaise/fatigue. Negative for chills, diaphoresis, fever and weight loss.  HENT:  Negative for nosebleeds and sore throat.   Eyes:  Negative for double vision.  Respiratory:  Negative for hemoptysis, sputum production, shortness of breath and wheezing.   Cardiovascular:  Negative for chest pain, palpitations, orthopnea and leg swelling.  Gastrointestinal:  Negative for abdominal pain, blood in stool, constipation, diarrhea, heartburn, melena, nausea and vomiting.  Genitourinary:  Negative for dysuria, frequency and urgency.  Musculoskeletal:  Positive for back pain and joint pain.  Skin: Negative.  Negative for itching and rash.  Neurological:  Negative for tingling and focal weakness.  Endo/Heme/Allergies:  Does not bruise/bleed easily.  Psychiatric/Behavioral:  Negative for depression. The patient is not nervous/anxious and does not have insomnia.      MEDICAL HISTORY:  Past Medical History:  Diagnosis Date   Adenocarcinoma (HCC)    right upper lobe   Arthritis    Chicken pox    Dyspnea    Environmental and seasonal  allergies    Fibromyalgia    GERD (gastroesophageal reflux disease)    Hemorrhoid    Hypersomnia    Hypertension    Hypothyroidism    Lung cancer (HCC)    Malignant neoplasm of right upper lobe of lung (HCC) 03/08/2019   Myositis ossificans    Osteoarthritis    Polycythemia 07/16/2021   dx by Dr. Marcello Fennel   Thyroid disease     SURGICAL HISTORY: Past Surgical History:  Procedure Laterality Date    bengin tongue lesion     Benign 1980's   ABDOMINAL HYSTERECTOMY  1979   BACK SURGERY     bladder tack  2011   BREAST BIOPSY Left    neg   BREAST BIOPSY Right 02/22/2021   u/s bc ribbon 8:00-- INVASIVE MAMMARY CARCINOMA, NO SPECIAL TYPE. Size of invasive carcinoma: 7 mm in this sample. Grade 1. Ductal carcinoma in situ: Present, intermediate grade. Lymphovascular invasion: Not identified   BREAST LUMPECTOMY WITH SENTINEL LYMPH NODE BIOPSY Right 03/05/2021   Procedure: BREAST LUMPECTOMY WITH SENTINEL LYMPH NODE BX;  Surgeon: Earline Mayotte, MD;  Location: [facility] ORS;  Service: General;  Laterality: Right;   CHOLECYSTECTOMY  2010   COLONOSCOPY  12/22/2011   COLONOSCOPY WITH PROPOFOL N/A 03/24/2017   Procedure: COLONOSCOPY WITH PROPOFOL;  Surgeon: Christena Deem, MD;  Location: [facility] ENDOSCOPY;  Service: Endoscopy;  Laterality: N/A;   COLONOSCOPY WITH PROPOFOL N/A 07/22/2020   Procedure: COLONOSCOPY WITH PROPOFOL;  Surgeon: Toledo, Boykin Nearing, MD;  Location: [facility] ENDOSCOPY;  Service: Gastroenterology;  Laterality: N/A;   ESOPHAGOGASTRODUODENOSCOPY (EGD) WITH PROPOFOL N/A 03/24/2017   Procedure: ESOPHAGOGASTRODUODENOSCOPY (EGD) WITH PROPOFOL;  Surgeon: Christena Deem, MD;  Location: [facility] ENDOSCOPY;  Service: Endoscopy;  Laterality: N/A;   ESOPHAGOGASTRODUODENOSCOPY (EGD) WITH PROPOFOL N/A 07/22/2020   Procedure: ESOPHAGOGASTRODUODENOSCOPY (EGD) WITH PROPOFOL;  Surgeon: Toledo, Boykin Nearing, MD;  Location: [facility] ENDOSCOPY;  Service: Gastroenterology;  Laterality: N/A;   GASTRIC BYPASS OPEN      1970's complicated by herniated stomach requiring reversal with mesh   HEMORRHOID SURGERY     INTERCOSTAL NERVE BLOCK Right 03/29/2019   Procedure: Intercostal Nerve Block;  Surgeon: Loreli Slot, MD;  Location: [facility] OR;  Service: Thoracic;  Laterality: Right;   JOINT REPLACEMENT Bilateral    TKR   KNEE ARTHROPLASTY Right 07/06/2016   Procedure: COMPUTER ASSISTED TOTAL KNEE ARTHROPLASTY;  Surgeon: Donato Heinz, MD;  Location: [facility] ORS;  Service: Orthopedics;  Laterality: Right;   KNEE ARTHROSCOPY Left 10/19/2006   x 3   KNEE ARTHROSCOPY Right    x2   LOBECTOMY Right 03/29/2019   XI ROBOTIC ASSISTED THORASCOPY- RIGHT UPPER LOBECTOMY (Right Chest)   NODE DISSECTION  03/29/2019   Procedure: Node Dissection;  Surgeon: Loreli Slot, MD;  Location: [facility] OR;  Service: Thoracic;;   plantar fascia Left    PLANTAR FASCIA RELEASE Right    x 3  POSTERIOR LAMINECTOMY / DECOMPRESSION LUMBAR SPINE  06/24/2015   SHOULDER ARTHROSCOPY WITH DEBRIDEMENT AND BICEP TENDON REPAIR Left 09/25/2018   Procedure: SHOULDER ARTHROSCOPY WITH DEBRIDEMENT DECOMPRESSION AND BICEP TENDON REPAIR - SCRUB TECH;  Surgeon: Christena Flake, MD;  Location: [facility] ORS;  Service: Orthopedics;  Laterality: Left;   TONSILLECTOMY     childhood   TOTAL KNEE ARTHROPLASTY Left 11/17/2004   VIDEO BRONCHOSCOPY WITH ENDOBRONCHIAL NAVIGATION N/A 03/04/2019   Procedure: VIDEO BRONCHOSCOPY WITH ENDOBRONCHIAL NAVIGATION;  Surgeon: Vida Rigger, MD;  Location: [facility] ORS;  Service: Thoracic;  Laterality: N/A;   VIDEO BRONCHOSCOPY WITH ENDOBRONCHIAL ULTRASOUND N/A 03/04/2019   Procedure: VIDEO BRONCHOSCOPY WITH ENDOBRONCHIAL ULTRASOUND;  Surgeon: Vida Rigger, MD;  Location: [facility] ORS;  Service: Thoracic;  Laterality: N/A;    SOCIAL HISTORY: Social History   Socioeconomic History   Marital status: Divorced    Spouse name: Not on file   Number of children: Not on file   Years of education: Not on file   Highest  education level: Not on file  Occupational History   Not on file  Tobacco Use   Smoking status: Former    Current packs/day: 0.00    Average packs/day: 2.0 packs/day for 10.0 years (20.0 ttl pk-yrs)    Types: Cigarettes    Start date: 07/18/1983    Quit date: 07/17/1993    Years since quitting: 29.7   Smokeless tobacco: Never  Vaping Use   Vaping status: Never Used  Substance and Sexual Activity   Alcohol use: No   Drug use: No   Sexual activity: Not on file  Other Topics Concern   Not on file  Social History Narrative   1ppd- quit > 20 years ago; no alcohol; in [place]; lives self. retd- cook.    Social Drivers of Corporate investment banker Strain: Low Risk  (08/17/2022)   Received from [facility]   Overall Financial Resource Strain (CARDIA)    Difficulty of Paying Living Expenses: Not hard at all  Food Insecurity: No Food Insecurity (08/17/2022)   Received from [facility]   Hunger Vital Sign    Worried About Running Out of Food in the Last Year: Never true    Ran Out of Food in the Last Year: Never true  Transportation Needs: No Transportation Needs (08/17/2022)   Received from [facility] - Transportation    In the past 12 months, has lack of transportation kept you from medical appointments or from getting medications?: No    Lack of Transportation (Non-Medical): No  Physical Activity: Not on file  Stress: Not on file  Social Connections: Not on file  Intimate Partner Violence: Not on file    FAMILY HISTORY: Family History  Problem Relation Age of Onset   Breast cancer Sister 1   Breast cancer Maternal Aunt 68   Lung cancer Maternal Aunt    Breast cancer Sister 15   Alzheimer's disease Mother    Arthritis Mother    Diabetes Mother    Heart failure Brother    Pancreatic cancer Brother 57   Lung cancer Brother    Liver cancer Father     ALLERGIES:  is allergic to flagyl [metronidazole],  phentermine, and pregabalin.  MEDICATIONS:  Current Outpatient Medications  Medication Sig Dispense Refill   baclofen (LIORESAL) 10 MG tablet Take 10 mg by mouth 2 (two) times daily as needed.     celecoxib (CELEBREX) 200 MG capsule Take  1 capsule (200 mg total) by mouth 2 (two) times daily with a meal. 60 capsule 1   FLUoxetine (PROZAC) 10 MG capsule Take 10 mg by mouth daily.     furosemide (LASIX) 20 MG tablet Take 20 mg by mouth daily as needed for fluid or edema.     levothyroxine (SYNTHROID) 88 MCG tablet Take 88 mcg by mouth daily before breakfast.     losartan (COZAAR) 50 MG tablet Take 50 mg by mouth daily.     omeprazole (PRILOSEC) 20 MG capsule Take 20 mg by mouth 2 (two) times daily before a meal.      oxyCODONE-acetaminophen (PERCOCET/ROXICET) 5-325 MG tablet Take 1 tablet by mouth every 4 (four) hours as needed.     predniSONE (STERAPRED UNI-PAK 48 TAB) 10 MG (48) TBPK tablet Take by mouth as needed. (Patient not taking: Reported on 04/12/2023)     triamterene-hydrochlorothiazide (MAXZIDE-25) 37.5-25 MG tablet Take 1 tablet by mouth daily.     Vitamin D, Ergocalciferol, (DRISDOL) 1.25 MG (50000 UNIT) CAPS capsule Take 50,000 Units by mouth once a week. Friday     zolpidem (AMBIEN) 5 MG tablet Take 5-7.5 mg by mouth at bedtime.     No current facility-administered medications for this visit.   PHYSICAL EXAMINATION: ECOG PERFORMANCE STATUS: 0 - Asymptomatic  Vitals:   04/12/23 1018 04/12/23 1030  BP: (!) 151/75 (!) 151/78  Pulse: (!) 49   Temp: 98.5 F (36.9 C)   SpO2: 99%     Filed Weights   04/12/23 1018  Weight: 245 lb 12.8 oz (111.5 kg)     Physical Exam Constitutional:      Comments: Obese. She is walking herself.  HENT:     Head: Normocephalic and atraumatic.     Mouth/Throat:     Pharynx: No oropharyngeal exudate.  Eyes:     Pupils: Pupils are equal, round, and reactive to light.  Cardiovascular:     Rate and Rhythm: Normal rate and regular rhythm.   Pulmonary:     Effort: Pulmonary effort is normal. No respiratory distress.     Breath sounds: Normal breath sounds. No wheezing.  Abdominal:     General: Bowel sounds are normal. There is no distension.     Palpations: Abdomen is soft. There is no mass.     Tenderness: There is no abdominal tenderness. There is no guarding or rebound.  Musculoskeletal:        General: No tenderness. Normal range of motion.     Cervical back: Normal range of motion and neck supple.  Skin:    General: Skin is warm.  Neurological:     Mental Status: She is alert and oriented to person, place, and time.  Psychiatric:        Mood and Affect: Affect normal.    LABORATORY DATA:  I have reviewed the data as listed Lab Results  Component Value Date   WBC 5.7 04/12/2023   HGB 13.4 04/12/2023   HCT 41.4 04/12/2023   MCV 84.1 04/12/2023   PLT 289 04/12/2023   Recent Labs    10/10/22 1042 04/12/23 1007  NA 133* 136  K 3.8 4.1  CL 100 101  CO2 26 27  GLUCOSE 99 99  BUN 15 13  CREATININE 0.84 0.90  CALCIUM 8.8* 9.3  GFRNONAA >60 >60  PROT 6.6  --   ALBUMIN 3.7  --   AST 17  --   ALT 11  --   ALKPHOS 68  --  BILITOT 0.6  --     RADIOGRAPHIC STUDIES: I have personally reviewed the radiological images as listed and agreed with the findings in the report. No results found.   ASSESSMENT & PLAN:   Carcinoma of overlapping sites of right breast in female, estrogen receptor positive (HCC) #Right breast invasive mammary carcinoma -PT1BN0 ER/PR positive HER2 negative- INVASIVE MAMMARY CARCINOMA, NO SPECIAL TYPE; grade 1; given low risk-okay to hold off Oncotype; s/p RT- Tolerating fairly well. Mammo [Dr.Cintron]- DEc 2024- WNL. Marland Kitchen   # Continue anastrozole at this time.  Patient tolerating fairly well except for hot flashes-not any worse.stable.   #  joint pains- chronic- unlikley ec to AI- continue to follow up with ortho/Dr.Chasnin.   # HTN- systolic BP 180; at home 130 as per pt. monitor  for now [facility] coat]- at home 130s- stable.   # RIGHT UPPER LOBE LUNG CA-stage I-status post resection clear margins-JUNE 2024 --stable exam no evidence of any progressive disease/recurrent disease. CT coronary- stable lung nodules; Annual CT scan for 5 years. Will order CT scan today.   # screening for Osteoporosis: JUNE 2023- BMD measured at [facility] -T-score of 0.0. on Vit D; add calcium- stable.   # Obesity  [s/p gastric by pass][stopped Ozempic because of issues]-monitor for now.- stable.   # Hypothyroidism- on synthroid [PCP]  # mild hypokalemia: likely secondary to triamterene hydrochlorothiazide.  Recommend increased dietary intake of potassium rich foods- stable.  Non-contrast CT # DISPOSITION: # follow up in 6 months-  MD;labs- cbc/Cmp;vit D-25-OH, B12 levels-; CT scan prior-  Dr.B   All questions were answered. The patient knows to call the clinic with any problems, questions or concerns.    Earna Coder, MD 04/12/2023 11:07 AM

## 2023-04-12 NOTE — Progress Notes (Signed)
 Having an SI fusion at [facility] in 2 months and MRI. Was told she had bone cancer.  Having some trouble swallowing solids and liquids.

## 2023-04-12 NOTE — Assessment & Plan Note (Addendum)
#  Right breast invasive mammary carcinoma -PT1BN0 ER/PR positive HER2 negative- INVASIVE MAMMARY CARCINOMA, NO SPECIAL TYPE; grade 1; given low risk-okay to hold off Oncotype; s/p RT- Tolerating fairly well. Mammo [Dr.Cintron]- DEc 2024- WNL. Glenda Gomez   # Continue anastrozole at this time.  Patient tolerating fairly well except for hot flashes-not any worse.stable.   #  joint pains- chronic- unlikley ec to AI- continue to follow up with ortho/Dr.Chasnin.   # HTN- systolic BP 180; at home 130 as per pt. monitor for now [facility] coat]- at home 130s- stable.   # RIGHT UPPER LOBE LUNG CA-stage I-status post resection clear margins-JUNE 2024 --stable exam no evidence of any progressive disease/recurrent disease. CT coronary- stable lung nodules; Annual CT scan for 5 years. Will order CT scan today.   # screening for Osteoporosis: JUNE 2023- BMD measured at [facility] -T-score of 0.0. on Vit D; add calcium- stable.   # Obesity  [s/p gastric by pass][stopped Ozempic because of issues]-monitor for now.- stable.   # Hypothyroidism- on synthroid [PCP]  # mild hypokalemia: likely secondary to triamterene hydrochlorothiazide.  Recommend increased dietary intake of potassium rich foods- stable.  Non-contrast CT # DISPOSITION: # follow up in 6 months-  MD;labs- cbc/Cmp;vit D-25-OH, B12 levels-; CT scan prior-  Dr.B

## 2023-04-14 ENCOUNTER — Other Ambulatory Visit: Payer: Self-pay | Admitting: *Deleted

## 2023-04-14 MED ORDER — VITAMIN D (ERGOCALCIFEROL) 1.25 MG (50000 UNIT) PO CAPS: 50000.0000 [IU] | ORAL_CAPSULE | ORAL | 1 refills | Status: DC

## 2023-05-24 ENCOUNTER — Other Ambulatory Visit: Payer: Self-pay | Admitting: Internal Medicine

## 2023-06-19 NOTE — Progress Notes (Signed)
 Chief Complaint  Patient presents with  . Follow-up    HPI  Glenda Gomez is a 73 y.o. 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 Denies chest pains  Ex  Smoker- quit aprox 28 yrs back (smoked 1-2 ppd x 18 yrs)  Recent labs; Hgb; 14.8 , Sugar;98,Se Creat; 0.8,A1c;6.1, Total cholesterol;222 , Triglycerides; 160 and TSH: 3.560. B12; 1003        Outpatient Encounter Medications as of 06/19/2023  Medication Sig Dispense Refill  . albuterol  90 mcg/actuation inhaler INHALE 2 PUFFS BY MOUTH EVERY 6 HOURS AS NEEDED FOR WHEEZING 54 g 1  . anastrozole  (ARIMIDEX ) 1 mg tablet Take 1 mg by mouth once daily    . baclofen (LIORESAL) 10 MG tablet TAKE 1/2 TO 1 TABLET BY MOUTH TWICE DAILY AS NEEDED 60 tablet 2  . celecoxib  (CELEBREX ) 200 MG capsule TAKE 1 CAPSULE(200 MG) BY MOUTH TWICE DAILY 180 capsule 1  . FLUoxetine (PROZAC) 10 MG capsule TAKE 1 CAPSULE(10 MG) BY MOUTH DAILY 90 capsule 1  . FUROsemide  (LASIX ) 20 MG tablet TAKE 1 TABLET(20 MG) BY MOUTH EVERY DAY AS NEEDED FOR SWELLING 90 tablet 1  . levothyroxine  (SYNTHROID ) 88 MCG tablet TAKE 1 TABLET(88 MCG) BY MOUTH EVERY DAY 30 TO 60 MINUTES BEFORE BREAKFAST ON AN EMPTY STOMACH AND WITH A GLASS OF WATER  90 tablet 1  . losartan  (COZAAR ) 50 MG tablet TAKE 1 TABLET(50 MG) BY MOUTH EVERY DAY 90 tablet 1  . omeprazole (PRILOSEC) 20 MG DR capsule TAKE 2 CAPSULES(40 MG) BY MOUTH EVERY DAY 180 capsule 1  .  oxyCODONE -acetaminophen  (PERCOCET) 5-325 mg tablet 1/2 bid prn 10 tablet 0  . triamterene -hydroCHLOROthiazide  (MAXZIDE -25) 37.5-25 mg tablet Take 1 tablet by mouth once daily 90 tablet 1  . zolpidem  (AMBIEN ) 5 MG tablet     . ascorbic acid, vitamin C, (VITAMIN C) 1000 MG tablet Take 1,000 mg by mouth once daily (Patient not taking: Reported on 06/19/2023)    . aspirin 81 MG EC tablet Take 81 mg by mouth once daily (Patient not taking: Reported on 06/19/2023)    . ergocalciferol , vitamin D2, 1,250 mcg (50,000 unit) capsule Take 1 capsule (50,000 Units total) by mouth every 7 (seven) days for 180 days (Patient not taking: Reported on 02/17/2023) 12 capsule 1  . fluticasone -umeclidinium-vilanterol (TRELEGY ELLIPTA) 200-62.5-25 mcg inhaler Inhale 1 Puff into the lungs once daily (Patient not taking: Reported on 06/19/2023) 3 each 2  . predniSONE (DELTASONE) 10 MG tablet Take 1 tablet (10 mg total) by mouth once daily 3,3,3,2,2,2,1,1,1 and stop (Patient not taking: Reported on 06/19/2023) 18 tablet 0  . tirzepatide (ZEPBOUND) 2.5 mg/0.5 mL pen injector Inject 0.5 mLs (2.5 mg total) subcutaneously once a week (Patient not taking: Reported on 06/19/2023) 0.5 mL 3  . traMADoL  (ULTRAM ) 50 mg tablet 1/2-1 po bid prn (Patient not taking: Reported on 06/19/2023) 10 tablet 0   No  facility-administered encounter medications on file as of 06/19/2023.    Allergies as of 06/19/2023 - Reviewed 06/19/2023  Allergen Reaction Noted  . Phentermine Abdominal Pain 04/27/2020  . Flagyl [metronidazole hcl] Diarrhea, Nausea, and Vomiting 07/17/2013  . Lyrica [pregabalin] Dizziness 07/17/2013    Past Medical History:  Diagnosis Date  . Arthritis   . Breast cancer of lower-outer quadrant of right female breast (CMS/HHS-HCC) 03/05/2021   7 mm IMC, 0/6 nodes, pT1b,N0. ER/PR positive, Her 2 neu negative, 2 mm minimal margin.DECLINED RADIATION THERAPY (03/23/2021) note by Dr. Rennie.  . Calculus of gallbladder with other cholecystitis,  without mention of obstruction   . Chicken pox   . Colon polyp   . Environmental allergies   . Essential hypertension, benign   . Fibromyalgia   . GERD (gastroesophageal reflux disease)   . Glaucoma (increased eye pressure)   . Hemorrhoid   . 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 Myositis ossificans    chronic pain, both feet  . Osteoarthritis   . Other and unspecified angina pectoris ()   . Thyroid disease   . Unspecified hypothyroidism     Past Surgical History:  Procedure Laterality Date  . ABDOMINAL HYSTERECTOMY  1979  . Left total knee arthroplasty  11/17/2004   [facility]  . Left knee arthrotomy, extensive synovectomy, and polyethylene exchange  10/19/2006   Dr Mardee  . CHOLECYSTECTOMY  2010  . BLADDER TAC  2011  . COLONOSCOPY  12/22/2011  . LAMINECTOMY POSTERIOR LUMBAR FACETECTOMY & FORAMINOTOMY W/DECOMP Bilateral 06/24/2015   Procedure: Lumbar two-three laminectomy;  Surgeon: Cena Rankin Gaines, MD;  Location: [facility];  Service: Neurosurgery;  Laterality: Bilateral;  . back surgery  07/17/2015  . Right total knee arthroplasty using computer-assisted navigation  07/06/2016   Dr Mardee  . COLONOSCOPY  03/24/2017   Tubular adenoma of the colon/Hyperplastic colon polyp/Repeat 89yrs/MUS  . EGD  03/24/2017   GERD/Gastritis/PHx BE/Repeat 30yrs/MUS  . Extensive arthroscopic debridement with biceps tenolysis and arthroscopic subacromial decompression, left shoulder. Left 09/25/2018   Dr. Edie  . video bronchoscopy with endobronchial navigation  03/04/2019  . thorascopy and lobectomy Right 03/29/2019  . COLONOSCOPY  07/22/2020   Tubular adenomas/PHx CP/Repeat 82yrs/TKT  . EGD  07/22/2020   Barrett's Esophagus/Gastritis/Repeat 65yrs/TKT  . ultrasound guided breast biopsy Right 02/22/2021  . MASTECTOMY PARTIAL / LUMPECTOMY Right 03/05/2021  . BREAST EXCISIONAL BIOPSY Left    negative  . COLONOSCOPY    . EXCISION OF BENIGN TONGUE LESION    1980s  . GASTRIC BYPASS OPEN  1970s   COMPLICATED BY HERNIATED STOMACH REQUIRING REVERSAL WITH MESH  . HEMRRHOIDECTOMY    . JOINT REPLACEMENT    . Left knee arthroscopy     x 3  . LEFT PLANTAR FASCIA RELEASE Left   . Right knee arthroscopy      x 2  . Right plantar fascia release      x 3  . SPINE SURGERY    . TONSILLECTOMY     CHILDHOOD  . UPPER GASTROINTESTINAL ENDOSCOPY      Vitals:   06/19/23 0949  BP: (!) 140/80  Pulse: 56     Exam Blood pressure (!) 140/80, pulse 56, weight (!) 113.1 kg (249 lb 6.4 oz), SpO2 97%.  Body mass index is 45.62 kg/m. Wt Readings from Last 3 Encounters:  06/19/23 (!) 113.1 kg (249 lb 6.4 oz)  02/17/23 (!) 111.1 kg (245 lb)  02/16/23 (!) 109.8 kg (242  lb)  Body mass index is 45.62 kg/m.  Tearful and anxious  Obese  VS reviewed     Eyes. Sclera and conjunctiva clear; Vision grossly intact; extraocular movements intact Oropharynx. Congested  EAC's and TM's : Normal  Neck. Supple. No swelling, masses, thyroid normal size, no masses palpated.   Lungs. Respirations unlabored. CTA Cardiovascular. Heart regular rate and rhythm without murmurs, gallops, or rubs Abdomen: Non tender. No masses felt PELVIC: Declined  Extremities;Trace edema  Tenderness + Rt lower back  Neurologic. 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 Rennie (Oncology) 3 Tinnitus:Doing better - Symptoms resolved on Rt side  4 Major depression/ GAD : Doing better- On Prozac and  Xanax  5 HTN; Stable On Losartan  6 Psychophysiological Insomnia;On Ambien  7 Hypothyroidism: On Levothyroxine  100 mcg po qd - TSH is 3.560 8 Swelling of legs: On  Furosemide  as needed Low sodium diet  9 Morbid Obesity/ Unintended weight gain/  Elevated A1c (6.1 ) Could no tolerate Ozempic (Nausea)  10 Paroxysmal A Fib: No further recurrence Has come off Amiodarone  11 Hypercholesterolemia; Rec Lipitor 10 mg - 3 days a week- Monitor  12 Health maintenance: Colonoscopy -2008 negative   Mammogram; 02/06/23:Stable right breast lumpectomy site. No mammographic evidence of  malignancy in the bilateral breasts.  Repeat diagnostic mammogram in 1 year  Continue efforts at weight reduction Up to date with Flu shot and  Inj Shingrix  Labs 1 week prior to next visit Follow up in 4 months      Tamra Leventhal  MD

## 2023-08-20 ENCOUNTER — Encounter: Payer: Self-pay | Admitting: Emergency Medicine

## 2023-08-20 ENCOUNTER — Emergency Department
Admission: EM | Admit: 2023-08-20 | Discharge: 2023-08-20 | Disposition: A | Discharge status: Home / Self Care | Attending: Emergency Medicine | Admitting: Emergency Medicine

## 2023-08-20 ENCOUNTER — Other Ambulatory Visit: Payer: Self-pay

## 2023-08-20 ENCOUNTER — Ambulatory Visit
Admission: EM | Admit: 2023-08-20 | Discharge: 2023-08-20 | Disposition: A | Discharge status: Home / Self Care | Attending: Physician Assistant | Admitting: Physician Assistant

## 2023-08-20 ENCOUNTER — Emergency Department

## 2023-08-20 DIAGNOSIS — I10 Essential (primary) hypertension: Secondary | ICD-10-CM | POA: Insufficient documentation

## 2023-08-20 DIAGNOSIS — R079 Chest pain, unspecified: Secondary | ICD-10-CM | POA: Insufficient documentation

## 2023-08-20 DIAGNOSIS — R519 Headache, unspecified: Secondary | ICD-10-CM | POA: Insufficient documentation

## 2023-08-20 DIAGNOSIS — Z9884 Bariatric surgery status: Secondary | ICD-10-CM | POA: Diagnosis not present

## 2023-08-20 DIAGNOSIS — Z79899 Other long term (current) drug therapy: Secondary | ICD-10-CM | POA: Diagnosis not present

## 2023-08-20 DIAGNOSIS — E039 Hypothyroidism, unspecified: Secondary | ICD-10-CM | POA: Insufficient documentation

## 2023-08-20 DIAGNOSIS — I161 Hypertensive emergency: Secondary | ICD-10-CM

## 2023-08-20 DIAGNOSIS — I4891 Unspecified atrial fibrillation: Secondary | ICD-10-CM

## 2023-08-20 DIAGNOSIS — R0602 Shortness of breath: Secondary | ICD-10-CM | POA: Diagnosis present

## 2023-08-20 DIAGNOSIS — Z853 Personal history of malignant neoplasm of breast: Secondary | ICD-10-CM | POA: Insufficient documentation

## 2023-08-20 DIAGNOSIS — Z85118 Personal history of other malignant neoplasm of bronchus and lung: Secondary | ICD-10-CM | POA: Insufficient documentation

## 2023-08-20 DIAGNOSIS — I498 Other specified cardiac arrhythmias: Secondary | ICD-10-CM | POA: Insufficient documentation

## 2023-08-20 LAB — CBC
HCT: 41.2 % (ref 36.0–46.0)
Hemoglobin: 13.1 g/dL (ref 12.0–15.0)
MCH: 27 pg (ref 26.0–34.0)
MCHC: 31.8 g/dL (ref 30.0–36.0)
MCV: 84.9 fL (ref 80.0–100.0)
Platelets: 239 K/uL (ref 150–400)
RBC: 4.85 MIL/uL (ref 3.87–5.11)
RDW: 14.9 % (ref 11.5–15.5)
WBC: 5.7 K/uL (ref 4.0–10.5)
nRBC: 0 % (ref 0.0–0.2)

## 2023-08-20 LAB — COMPREHENSIVE METABOLIC PANEL WITH GFR
ALT: 14 U/L (ref 0–44)
AST: 27 U/L (ref 15–41)
Albumin: 3.7 g/dL (ref 3.5–5.0)
Alkaline Phosphatase: 73 U/L (ref 38–126)
Anion gap: 8 (ref 5–15)
BUN: 11 mg/dL (ref 8–23)
CO2: 29 mmol/L (ref 22–32)
Calcium: 9.1 mg/dL (ref 8.9–10.3)
Chloride: 103 mmol/L (ref 98–111)
Creatinine, Ser: 0.71 mg/dL (ref 0.44–1.00)
GFR, Estimated: >60 mL/min (ref >60)
Glucose, Bld: 112 mg/dL — ABNORMAL HIGH (ref 70–99)
Potassium: 3.6 mmol/L (ref 3.5–5.1)
Sodium: 140 mmol/L (ref 135–145)
Total Bilirubin: 0.7 mg/dL (ref 0.0–1.2)
Total Protein: 6.6 g/dL (ref 6.5–8.1)

## 2023-08-20 LAB — TROPONIN I (HIGH SENSITIVITY): Troponin I (High Sensitivity): 8 ng/L (ref <18)

## 2023-08-20 MED ORDER — LOSARTAN POTASSIUM 50 MG PO TABS
50.0000 mg | ORAL_TABLET | Freq: Once | ORAL | Status: AC
  Administered 2023-08-20: 50 mg via ORAL
  Filled 2023-08-20: qty 1

## 2023-08-20 MED ORDER — TRIAMTERENE-HCTZ 37.5-25 MG PO TABS
1.0000 | ORAL_TABLET | Freq: Once | ORAL | Status: AC
  Administered 2023-08-20: 1 via ORAL
  Filled 2023-08-20: qty 1

## 2023-08-20 MED ORDER — IOHEXOL 350 MG/ML SOLN
75.0000 mL | Freq: Once | INTRAVENOUS | Status: AC | PRN
  Administered 2023-08-20: 75 mL via INTRAVENOUS

## 2023-08-20 MED ORDER — TRIAMTERENE-HCTZ 37.5-25 MG PO TABS: 1.0000 | ORAL_TABLET | Freq: Every day | ORAL | 0 refills | Status: AC

## 2023-08-20 NOTE — ED Notes (Signed)
 Patient is being discharged from the Urgent Care and sent to the Emergency Department via EMS . Per Lyle Host, PA, patient is in need of higher level of care due to Afib, HTN, chest pain and shortness of breath. Patient is aware and verbalizes understanding of plan of care.  Vitals:   08/20/23 1108  BP: (!) 228/83  Pulse: 63  Resp: 15  Temp: 98.3 F (36.8 C)  SpO2: 99%

## 2023-08-20 NOTE — Discharge Instructions (Addendum)
 You were seen in the emergency department for elevated blood pressure.  You are found to have an atrial dysrhythmia when you were at urgent care.  Call cardiology and schedule appointment to be seen tomorrow   You are given a refill for your home antihypertensive medications since you are not taking them.  Take both of your antihypertensive medications tonight.  If you continue to have elevated blood pressure that is greater than 160 you can take an extra losartan .  You can repeat tomorrow morning if needed.  Call your primary care physician to schedule close follow-up appointment to reevaluate your high blood pressure.  Return to the emergency department for any worsening symptoms.

## 2023-08-20 NOTE — Discharge Instructions (Signed)

## 2023-08-20 NOTE — ED Notes (Signed)
 PO challenge completed. No issues.

## 2023-08-20 NOTE — ED Triage Notes (Signed)
 Pt via [facility] from [facility]. Pt c/o SOB and cough for the past couple of days. Reports EKG was done at [facility] and they said she was in a fib. Pt states she was in a fib once before after a surgery but does not take any medication for same. Pt is hypertensive and c/o headache. Denies chest pain. Pt is A&Ox4 and NAD

## 2023-08-20 NOTE — ED Provider Notes (Signed)
 [facility] Provider Note    Event Date/Time   First MD Initiated Contact with Patient 08/20/23 1157     (approximate)   History   Shortness of Breath   HPI  Glenda Gomez is a 73 y.o. female past medical history significant for gastric bypass, right breast carcinoma followed by oncology, hypertension, right upper lobe lung cancer status post wrist suction in 2024, obesity, hypothyroidism, who presents to the emergency department with hypertension.  Patient states that she has been having elevated blood pressure for the past couple of weeks.  Keeps checking it and that it has not gone down.  Today she realized that she had not refilled one of her combination pills of antihypertensive medications.  States that she has been taking her losartan  but has not been taking her maxzide  over the past couple of weeks.  Did not realize that she was out of this medication.  States that she has not been feeling well for the past couple of weeks.  Vague symptoms of headache, some intermittent chest pain and shortness of breath.  Denies nausea or vomiting.  On chart review patient presented to urgent care earlier today for hypertension.  Per their note she had been having high blood pressure for the past couple of days and had reports of headache, shortness of breath and dizziness with palpitations and an intermittent cough.  Patient was found to have an atrial tachycardia and was sent to the emergency department.  1 prior episode of atrial fibrillation after surgery.  Not on anticoagulation.     Physical Exam   Triage Vital Signs: ED Triage Vitals [08/20/23 1204]  Encounter Vitals Group     BP      Girls Systolic BP Percentile      Girls Diastolic BP Percentile      Boys Systolic BP Percentile      Boys Diastolic BP Percentile      Pulse      Resp      Temp      Temp src      SpO2      Weight      Height      Head Circumference      Peak Flow      Pain Score 4      Pain Loc      Pain Education      Exclude from Growth Chart     Most recent vital signs: Vitals:   08/20/23 1230 08/20/23 1300  BP: (!) 208/56 (!) 200/74  Pulse: (!) 58 (!) 46  Resp: 14 18  Temp:    SpO2: 96% 95%    Physical Exam Constitutional:      Appearance: She is well-developed.  HENT:     Head: Atraumatic.  Eyes:     Conjunctiva/sclera: Conjunctivae normal.  Cardiovascular:     Rate and Rhythm: Regular rhythm. Bradycardia present.  Pulmonary:     Effort: No respiratory distress.  Abdominal:     General: There is no distension.     Palpations: Abdomen is soft.     Tenderness: There is no abdominal tenderness.  Musculoskeletal:        General: Normal range of motion.     Cervical back: Normal range of motion.     Right lower leg: No edema.     Left lower leg: No edema.  Skin:    General: Skin is warm.     Capillary Refill: Capillary refill takes  less than 2 seconds.  Neurological:     General: No focal deficit present.     Mental Status: She is alert. Mental status is at baseline.     IMPRESSION / MDM / ASSESSMENT AND PLAN / ED COURSE  I reviewed the triage vital signs and the nursing notes.  Differential diagnosis including hypertensive emergency, ACS, anemia, atrial dysrhythmia, electrolyte abnormality, pulmonary embolism, intracranial hemorrhage, malignancy, pneumonia  EKG  I, Clotilda Punter, the attending physician, personally viewed and interpreted this ECG.  Sinus bradycardia with a heart rate of 56.  Normal intervals.  No chamber enlargement.  No significant ST elevation or depression.  No findings of acute ischemia or dysrhythmia.  Review of EKG earlier today appeared to have an irregular rhythm, questionable flutter waves versus P waves.  Concern for an atrial dysrhythmia  No tachycardic or bradycardic dysrhythmias while on cardiac telemetry.  RADIOLOGY I independently reviewed imaging, my interpretation of imaging: CT scan of the head with no  signs of acute findings  CTA that is no acute findings.  No pulmonary embolism.  Discussed incidental findings with the patient.  LABS (all labs ordered are listed, but only abnormal results are displayed) Labs interpreted as -    Labs Reviewed  COMPREHENSIVE METABOLIC PANEL WITH GFR - Abnormal; Notable for the following components:      Result Value   Glucose, Bld 112 (*)    All other components within normal limits  CBC  TROPONIN I (HIGH SENSITIVITY)     MDM  On arrival significantly hypertensive with a blood pressure of 228/83.  No findings of endorgan damage.  Troponin is negative and no active chest pain at this time.  No signs of pulmonary embolism or dissection.  No signs of intracranial hemorrhage.  No change in vision.  Likely in the setting of missed medication given that she had been off of a combination blood pressure medication for the past couple of weeks.  Reordered home antihypertensive medications while in the emergency department.  Discussed the patient's case with cardiology Dr. Florencio who recommended discharge and seeing in their clinic tomorrow.  Would hold Eliquis at this time, he felt like she had a atrial dysrhythmia but was not certain if the she had flutter waves or A-fib.  Patient expressed understanding, states that she will call her primary care physician for her uncontrolled hypertension.  Discussed a second dose of her losartan  if she continued to have significantly elevated blood pressure at home.  Discussed follow-up tomorrow with cardiology.  Given a refill for her education that she was out of.     PROCEDURES:  Critical Care performed: No  Procedures  Patient's presentation is most consistent with acute presentation with potential threat to life or bodily function.   MEDICATIONS ORDERED IN ED: Medications  iohexol  (OMNIPAQUE ) 350 MG/ML injection 75 mL (75 mLs Intravenous Contrast Given 08/20/23 1341)  losartan  (COZAAR ) tablet 50 mg (50 mg  Oral Given 08/20/23 1529)  triamterene -hydrochlorothiazide  (MAXZIDE -25) 37.5-25 MG per tablet 1 tablet (1 tablet Oral Given 08/20/23 1529)    FINAL CLINICAL IMPRESSION(S) / ED DIAGNOSES   Final diagnoses:  Atrial dysrhythmia  Uncontrolled hypertension     Rx / DC Orders   ED Discharge Orders          Ordered    triamterene -hydrochlorothiazide  (MAXZIDE -25) 37.5-25 MG tablet  Daily        08/20/23 1452             Note:  This document  was prepared using Conservation officer, historic buildings and may include unintentional dictation errors.   Suzanne Kirsch, MD 08/20/23 1558

## 2023-08-20 NOTE — Progress Notes (Signed)
 Patient ID: Glenda Gomez, female   DOB: Jan 29, 1951, 73 y.o.   MRN: [id number]  Patient seen in emergency room today after being sent from urgent care with tachycardia EKG read A-fib but looks like an atrial tachycardia or a flutter to my reading intermittent paroxysmal  Patient hemodynamically stable.  Did not place patient on anticoagulation she has had a previous EKG with A-fib this 1 did not look like A-fib to my reading  We have asked patient to come to the office for an EKG as well as to have a ZIO monitor placed for 72 hours  Patient to follow-up with cardiology after monitor removed  Will consider low-dose beta-blockade therapy to help  Patient was initially seen because of hypertensive urgency because she missed her Maxide dose and has only been on losartan  50 blood pressure was 200  Case discussed with Dr. Suzanne in ER

## 2023-08-20 NOTE — ED Provider Notes (Signed)
 [facility]    CSN: [id number] Arrival date & time: 08/20/23  1043      History   Chief Complaint Chief Complaint  Patient presents with   Hypertension    HPI Glenda Gomez is a 73 y.o. female presenting for elevated blood pressure readings in the 200s systolic for the past few days.  Patient also reports headaches, shortness of breath, dizziness and palpitations for the past couple of days.  Patient takes losartan  for hypertension.  She says she has been out of the [place] for the past couple weeks.  Reports she recently developed a cough in the past 2 to 3 days.  No known fevers.  Intermittent chest pain but denies chest pain at this moment.  Patient's medical history is significant for lung cancer, breast cancer, fibromyalgia, hypertension, hypothyroidism, osteoarthritis, polycythemia.  Patient reports she has a history of atrial fibrillation that occurred at the time she had a portion of her lung removed.  She is not on any medications for atrial fibrillation as it occurred only once per patient.  HPI  Past Medical History:  Diagnosis Date   Adenocarcinoma (HCC)    right upper lobe   Arthritis    Chicken pox    Dyspnea    Environmental and seasonal allergies    Fibromyalgia    GERD (gastroesophageal reflux disease)    Hemorrhoid    Hypersomnia    Hypertension    Hypothyroidism    Lung cancer (HCC)    Malignant neoplasm of right upper lobe of lung (HCC) 03/08/2019   Myositis ossificans    Osteoarthritis    Polycythemia 07/16/2021   dx by Dr. Sadie   Thyroid disease     Patient Active Problem List   Diagnosis Date Noted   Carcinoma of overlapping sites of right breast in female, estrogen receptor positive (HCC) 05/25/2021   Aortic atherosclerosis (HCC) 03/12/2020   Low vitamin B12 level 03/12/2020   PAF (paroxysmal atrial fibrillation) (HCC) 04/30/2019   S/P lobectomy of lung 03/29/2019   Malignant neoplasm of right upper lobe of lung (HCC) 03/08/2019    Degenerative tear of glenoid labrum of left shoulder 09/28/2018   Tendinitis of upper biceps tendon of left shoulder 09/28/2018   Tendinitis of left rotator cuff 09/03/2018   Morbid obesity with BMI of 40.0-44.9, adult (HCC) 07/18/2018   Nodule of right lung 07/18/2018   Chronic fatigue 09/12/2017   Chest pain with moderate risk for cardiac etiology 08/23/2017   Exertional dyspnea 08/23/2017   S/P total knee arthroplasty 07/06/2016    Past Surgical History:  Procedure Laterality Date    bengin tongue lesion     Benign 1980's   ABDOMINAL HYSTERECTOMY  1979   BACK SURGERY     bladder tack  2011   BREAST BIOPSY Left    neg   BREAST BIOPSY Right 02/22/2021   u/s bc ribbon 8:00-- INVASIVE MAMMARY CARCINOMA, NO SPECIAL TYPE. Size of invasive carcinoma: 7 mm in this sample. Grade 1. Ductal carcinoma in situ: Present, intermediate grade. Lymphovascular invasion: Not identified   BREAST LUMPECTOMY WITH SENTINEL LYMPH NODE BIOPSY Right 03/05/2021   Procedure: BREAST LUMPECTOMY WITH SENTINEL LYMPH NODE BX;  Surgeon: Dessa Reyes ORN, MD;  Location: [facility] ORS;  Service: General;  Laterality: Right;   CHOLECYSTECTOMY  2010   COLONOSCOPY  12/22/2011   COLONOSCOPY WITH PROPOFOL  N/A 03/24/2017   Procedure: COLONOSCOPY WITH PROPOFOL ;  Surgeon: Gaylyn Gladis PENNER, MD;  Location: [facility] ENDOSCOPY;  Service: Endoscopy;  Laterality: N/A;   COLONOSCOPY WITH PROPOFOL  N/A 07/22/2020   Procedure: COLONOSCOPY WITH PROPOFOL ;  Surgeon: Toledo, Ladell POUR, MD;  Location: [facility] ENDOSCOPY;  Service: Gastroenterology;  Laterality: N/A;   ESOPHAGOGASTRODUODENOSCOPY (EGD) WITH PROPOFOL  N/A 03/24/2017   Procedure: ESOPHAGOGASTRODUODENOSCOPY (EGD) WITH PROPOFOL ;  Surgeon: Gaylyn Gladis PENNER, MD;  Location: [facility]OSCOPY;  Service: Endoscopy;  Laterality: N/A;   ESOPHAGOGASTRODUODENOSCOPY (EGD) WITH PROPOFOL  N/A 07/22/2020   Procedure: ESOPHAGOGASTRODUODENOSCOPY (EGD) WITH PROPOFOL ;  Surgeon: Toledo, Ladell POUR, MD;   Location: [facility] ENDOSCOPY;  Service: Gastroenterology;  Laterality: N/A;   GASTRIC BYPASS OPEN     1970's complicated by herniated stomach requiring reversal with mesh   HEMORRHOID SURGERY     INTERCOSTAL NERVE BLOCK Right 03/29/2019   Procedure: Intercostal Nerve Block;  Surgeon: Kerrin Elspeth BROCKS, MD;  Location: [facility] OR;  Service: Thoracic;  Laterality: Right;   JOINT REPLACEMENT Bilateral    TKR   KNEE ARTHROPLASTY Right 07/06/2016   Procedure: COMPUTER ASSISTED TOTAL KNEE ARTHROPLASTY;  Surgeon: Mardee Lynwood SQUIBB, MD;  Location: [facility] ORS;  Service: Orthopedics;  Laterality: Right;   KNEE ARTHROSCOPY Left 10/19/2006   x 3   KNEE ARTHROSCOPY Right    x2   LOBECTOMY Right 03/29/2019   XI ROBOTIC ASSISTED THORASCOPY- RIGHT UPPER LOBECTOMY (Right Chest)   NODE DISSECTION  03/29/2019   Procedure: Node Dissection;  Surgeon: Kerrin Elspeth BROCKS, MD;  Location: [facility] OR;  Service: Thoracic;;   plantar fascia Left    PLANTAR FASCIA RELEASE Right    x 3   POSTERIOR LAMINECTOMY / DECOMPRESSION LUMBAR SPINE  06/24/2015   SHOULDER ARTHROSCOPY WITH DEBRIDEMENT AND BICEP TENDON REPAIR Left 09/25/2018   Procedure: SHOULDER ARTHROSCOPY WITH DEBRIDEMENT DECOMPRESSION AND BICEP TENDON REPAIR - SCRUB TECH;  Surgeon: Edie Norleen PARAS, MD;  Location: [facility] ORS;  Service: Orthopedics;  Laterality: Left;   TONSILLECTOMY     childhood   TOTAL KNEE ARTHROPLASTY Left 11/17/2004   VIDEO BRONCHOSCOPY WITH ENDOBRONCHIAL NAVIGATION N/A 03/04/2019   Procedure: VIDEO BRONCHOSCOPY WITH ENDOBRONCHIAL NAVIGATION;  Surgeon: Parris Manna, MD;  Location: [facility] ORS;  Service: Thoracic;  Laterality: N/A;   VIDEO BRONCHOSCOPY WITH ENDOBRONCHIAL ULTRASOUND N/A 03/04/2019   Procedure: VIDEO BRONCHOSCOPY WITH ENDOBRONCHIAL ULTRASOUND;  Surgeon: Parris Manna, MD;  Location: [facility] ORS;  Service: Thoracic;  Laterality: N/A;    OB History     Gravida  2   Para  2   Term      Preterm      AB      Living  2      SAB       IAB      Ectopic      Multiple      Live Births               Home Medications    Prior to Admission medications   Medication Sig Start Date End Date Taking? Authorizing Provider  FLUoxetine (PROZAC) 10 MG capsule Take 10 mg by mouth daily. 06/22/22 06/22/23  [provider]  furosemide  (LASIX ) 20 MG tablet Take 20 mg by mouth daily as needed for fluid or edema.    [provider]  levothyroxine  (SYNTHROID ) 88 MCG tablet Take 88 mcg by mouth daily before breakfast. 03/04/20   [provider]  losartan  (COZAAR ) 50 MG tablet Take 50 mg by mouth daily.    [provider]  omeprazole (PRILOSEC) 20 MG capsule Take 20 mg by mouth 2 (two) times daily before a meal.  [provider]  predniSONE (STERAPRED UNI-PAK 48 TAB) 10 MG (48) TBPK tablet Take by mouth as needed. Patient not taking: Reported on 04/12/2023 06/06/22   [provider]  triamterene -hydrochlorothiazide  (MAXZIDE -25) 37.5-25 MG tablet Take 1 tablet by mouth daily.    [provider]  Vitamin D , Ergocalciferol , (DRISDOL ) 1.25 MG (50000 UNIT) CAPS capsule Take 1 capsule (50,000 Units total) by mouth once a week. Friday 04/14/23   Brahmanday, Govinda R, MD  zolpidem  (AMBIEN ) 5 MG tablet Take 5-7.5 mg by mouth at bedtime.    [provider]    Family History Family History  Problem Relation Age of Onset   Breast cancer Sister 56   Breast cancer Maternal Aunt 87   Lung cancer Maternal Aunt    Breast cancer Sister 37   Alzheimer's disease Mother    Arthritis Mother    Diabetes Mother    Heart failure Brother    Pancreatic cancer Brother 68   Lung cancer Brother    Liver cancer Father     Social History Social History   Tobacco Use   Smoking status: Former    Current packs/day: 0.00    Average packs/day: 2.0 packs/day for 10.0 years (20.0 ttl pk-yrs)    Types: Cigarettes    Start date: 07/18/1983    Quit date: 07/17/1993    Years since  quitting: 30.1   Smokeless tobacco: Never  Vaping Use   Vaping status: Never Used  Substance Use Topics   Alcohol use: No   Drug use: No     Allergies   Flagyl [metronidazole], Phentermine, and Pregabalin   Review of Systems Review of Systems  Constitutional:  Positive for fatigue. Negative for fever.  HENT:  Negative for congestion.   Respiratory:  Positive for cough, chest tightness and shortness of breath.   Cardiovascular:  Positive for chest pain and palpitations. Negative for leg swelling.  Gastrointestinal:  Negative for nausea and vomiting.  Neurological:  Positive for dizziness, light-headedness and headaches. Negative for syncope.     Physical Exam Triage Vital Signs ED Triage Vitals  Encounter Vitals Group     BP 08/20/23 1108 (!) 228/83     Girls Systolic BP Percentile --      Girls Diastolic BP Percentile --      Boys Systolic BP Percentile --      Boys Diastolic BP Percentile --      Pulse Rate 08/20/23 1108 63     Resp 08/20/23 1108 15     Temp 08/20/23 1108 98.3 F (36.8 C)     Temp Source 08/20/23 1108 Oral     SpO2 08/20/23 1108 99 %     Weight 08/20/23 1107 245 lb 13 oz (111.5 kg)     Height 08/20/23 1107 5' 3 (1.6 m)     Head Circumference --      Peak Flow --      Pain Score 08/20/23 1106 6     Pain Loc --      Pain Education --      Exclude from Growth Chart --    No data found.  Updated Vital Signs BP (!) 228/83 (BP Location: Left Arm)   Pulse 63   Temp 98.3 F (36.8 C) (Oral)   Resp 15   Ht 5' 3 (1.6 m)   Wt 245 lb 13 oz (111.5 kg)   SpO2 99%   BMI 43.54 kg/m      Physical Exam Vitals and nursing  note reviewed.  Constitutional:      General: She is in acute distress.     Appearance: Normal appearance. She is not ill-appearing or toxic-appearing.     Comments: Patient breathing quickly and heavily  HENT:     Head: Normocephalic and atraumatic.     Nose: Nose normal.     Mouth/Throat:     Mouth: Mucous membranes are  moist.     Pharynx: Oropharynx is clear.  Eyes:     General: No scleral icterus.       Right eye: No discharge.        Left eye: No discharge.     Conjunctiva/sclera: Conjunctivae normal.  Cardiovascular:     Rate and Rhythm: Normal rate. Rhythm irregular.     Heart sounds: Normal heart sounds.  Pulmonary:     Effort: Respiratory distress present.     Breath sounds: Normal breath sounds.     Comments: Increased respiratory rate. Musculoskeletal:     Cervical back: Neck supple.  Skin:    General: Skin is dry.  Neurological:     General: No focal deficit present.     Mental Status: She is alert. Mental status is at baseline.     Motor: No weakness.     Gait: Gait normal.  Psychiatric:        Mood and Affect: Mood normal.        Behavior: Behavior normal.      UC Treatments / Results  Labs (all labs ordered are listed, but only abnormal results are displayed) Labs Reviewed - No data to display  EKG   Radiology No results found.  Procedures ED EKG  Date/Time: 08/20/2023 11:24 AM  Performed by: Arvis Jolan NOVAK, PA-C Authorized by: Arvis Jolan NOVAK, PA-C   Interpretation:    Interpretation: abnormal   Rate:    ECG rate:  97 Rhythm:    Rhythm: atrial fibrillation   Ectopy:    Ectopy: none   QRS:    QRS axis:  Normal   QRS intervals:  Normal   QRS conduction: normal   ST segments:    ST segments:  Non-specific T waves:    T waves: non-specific   Other findings:    Other findings: prolonged qTc interval   Comments:     Atrial fibrillation with prolonged QT and nonspecific ST and T wave changes  (including critical care time)  Medications Ordered in UC Medications - No data to display  Initial Impression / Assessment and Plan / UC Course  I have reviewed the triage vital signs and the nursing notes.  Pertinent labs & imaging results that were available during my care of the patient were reviewed by me and considered in my medical decision making (see  chart for details).   72 year old female with history of lung cancer, breast cancer, hypertension, GERD, thyroid disease, osteoarthritis, fibromyalgia presents for significantly elevated blood pressure readings over the past few days with associated cough, shortness of breath, chest tightness, dizziness, palpitations and fatigue.  Blood pressure 228/83.  On exam patient is in some level of distress.  She is breathing rapidly but able to speak in full sentences.  Her chest is clear.  Heart rhythm seems irregular.  EKG today shows atrial fibrillation with prolonged QT and nonspecific ST and T wave changes.  Reviewed EKG results with patient.  Advised ED evaluation as she is not currently being treated for atrial fibrillation and is very symptomatic with a significantly elevated blood  pressure.  Patient is understanding and agreeable.  EMS contacted and report given.  Nursing staff started IV.  Patient leaving in stable condition and route to [facility].   Final Clinical Impressions(s) / UC Diagnoses   Final diagnoses:  Atrial fibrillation, unspecified type (HCC)  Chest pain, unspecified type  Hypertensive emergency     Discharge Instructions      You have been advised to follow up immediately in the emergency department for concerning signs.symptoms. If you declined EMS transport, please have a family member take you directly to the ED at this time. Do not delay. Based on concerns about condition, if you do not follow up in th e ED, you may risk poor outcomes including worsening of condition, delayed treatment and potentially life threatening issues. If you have declined to go to the ED at this time, you should call your PCP immediately to set up a follow up appointment.  Go to ED for red flag symptoms, including; fevers you cannot reduce with Tylenol /Motrin, severe headaches, vision changes, numbness/weakness in part of the body, lethargy, confusion, intractable vomiting, severe dehydration, chest  pain, breathing difficulty, severe persistent abdominal or pelvic pain, signs of severe infection (increased redness, swelling of an area), feeling faint or passing out, dizziness, etc. You should especially go to the ED for sudden acute worsening of condition if you do not elect to go at this time.    ED Prescriptions   None    I have reviewed the PDMP during this encounter.   Arvis Jolan NOVAK, PA-C 08/20/23 1606

## 2023-08-20 NOTE — ED Notes (Signed)
PO challenge underway

## 2023-08-20 NOTE — ED Triage Notes (Signed)
 Patient states that she took her BP after 10 this morning and it was 213/106.  Patient reports elevated BP for past couple of days.  Patient has been out of one of her blood pressure medicine for 2 weeks.  Patient states that she still has been taking the Losartan .  Patient reports HAs, chest pain and SOB.

## 2023-10-03 ENCOUNTER — Ambulatory Visit
Admission: RE | Admit: 2023-10-03 | Discharge: 2023-10-03 | Disposition: A | Discharge status: Home / Self Care | Payer: Medicare HMO | Source: Ambulatory Visit | Attending: Internal Medicine | Admitting: Internal Medicine

## 2023-10-03 DIAGNOSIS — C3411 Malignant neoplasm of upper lobe, right bronchus or lung: Secondary | ICD-10-CM | POA: Diagnosis present

## 2023-10-11 ENCOUNTER — Inpatient Hospital Stay: Payer: Medicare HMO | Attending: Internal Medicine | Admitting: Internal Medicine

## 2023-10-11 ENCOUNTER — Encounter: Payer: Self-pay | Admitting: Internal Medicine

## 2023-10-11 ENCOUNTER — Inpatient Hospital Stay: Payer: Medicare HMO

## 2023-10-11 VITALS — BP 170/85 | HR 68 | Temp 98.9°F | Resp 20 | Ht 63.0 in | Wt 253.2 lb

## 2023-10-11 DIAGNOSIS — C50811 Malignant neoplasm of overlapping sites of right female breast: Secondary | ICD-10-CM | POA: Diagnosis not present

## 2023-10-11 DIAGNOSIS — E876 Hypokalemia: Secondary | ICD-10-CM | POA: Insufficient documentation

## 2023-10-11 DIAGNOSIS — C3411 Malignant neoplasm of upper lobe, right bronchus or lung: Secondary | ICD-10-CM

## 2023-10-11 DIAGNOSIS — E669 Obesity, unspecified: Secondary | ICD-10-CM | POA: Insufficient documentation

## 2023-10-11 DIAGNOSIS — E039 Hypothyroidism, unspecified: Secondary | ICD-10-CM | POA: Insufficient documentation

## 2023-10-11 DIAGNOSIS — I10 Essential (primary) hypertension: Secondary | ICD-10-CM | POA: Diagnosis not present

## 2023-10-11 DIAGNOSIS — Z79811 Long term (current) use of aromatase inhibitors: Secondary | ICD-10-CM | POA: Insufficient documentation

## 2023-10-11 DIAGNOSIS — Z85118 Personal history of other malignant neoplasm of bronchus and lung: Secondary | ICD-10-CM | POA: Diagnosis not present

## 2023-10-11 DIAGNOSIS — Z170 Estrogen receptor positive status [ER+]: Secondary | ICD-10-CM | POA: Insufficient documentation

## 2023-10-11 LAB — CMP (CANCER CENTER ONLY)
ALT: 14 U/L (ref 0–44)
AST: 34 U/L (ref 15–41)
Albumin: 3.8 g/dL (ref 3.5–5.0)
Alkaline Phosphatase: 84 U/L (ref 38–126)
Anion gap: 11 (ref 5–15)
BUN: 11 mg/dL (ref 8–23)
CO2: 22 mmol/L (ref 22–32)
Calcium: 9.1 mg/dL (ref 8.9–10.3)
Chloride: 98 mmol/L (ref 98–111)
Creatinine: 0.85 mg/dL (ref 0.44–1.00)
GFR, Estimated: >60 mL/min (ref >60)
Glucose, Bld: 127 mg/dL — ABNORMAL HIGH (ref 70–99)
Potassium: 3.7 mmol/L (ref 3.5–5.1)
Sodium: 131 mmol/L — ABNORMAL LOW (ref 135–145)
Total Bilirubin: 0.7 mg/dL (ref 0.0–1.2)
Total Protein: 6.7 g/dL (ref 6.5–8.1)

## 2023-10-11 LAB — CBC WITH DIFFERENTIAL (CANCER CENTER ONLY)
Abs Immature Granulocytes: 0.02 K/uL (ref 0.00–0.07)
Basophils Absolute: 0 K/uL (ref 0.0–0.1)
Basophils Relative: 1 %
Eosinophils Absolute: 0.2 K/uL (ref 0.0–0.5)
Eosinophils Relative: 4 %
HCT: 41.9 % (ref 36.0–46.0)
Hemoglobin: 13.7 g/dL (ref 12.0–15.0)
Immature Granulocytes: 0 %
Lymphocytes Relative: 28 %
Lymphs Abs: 1.6 K/uL (ref 0.7–4.0)
MCH: 27.3 pg (ref 26.0–34.0)
MCHC: 32.7 g/dL (ref 30.0–36.0)
MCV: 83.5 fL (ref 80.0–100.0)
Monocytes Absolute: 0.5 K/uL (ref 0.1–1.0)
Monocytes Relative: 8 %
Neutro Abs: 3.4 K/uL (ref 1.7–7.7)
Neutrophils Relative %: 59 %
Platelet Count: 263 K/uL (ref 150–400)
RBC: 5.02 MIL/uL (ref 3.87–5.11)
RDW: 14.6 % (ref 11.5–15.5)
WBC Count: 5.7 K/uL (ref 4.0–10.5)
nRBC: 0 % (ref 0.0–0.2)

## 2023-10-11 LAB — VITAMIN D 25 HYDROXY (VIT D DEFICIENCY, FRACTURES): Vit D, 25-Hydroxy: 40.88 ng/mL (ref 30–100)

## 2023-10-11 LAB — VITAMIN B12: Vitamin B-12: 287 pg/mL (ref 180–914)

## 2023-10-11 MED ORDER — ANASTROZOLE 1 MG PO TABS: 1.0000 mg | ORAL_TABLET | Freq: Every day | ORAL | 2 refills | Status: AC

## 2023-10-11 NOTE — Progress Notes (Signed)
 What kind of spices should she stay away from? She is making a tonic. She says it makes her feel better and have a little more energy.  CT chest 10/03/23.  C/o cough and sob. Legs and ankles swelling.  She said she was having an episode while retaking her BP, feeling tingly, shaky, anxious and headache. This has been happening off and on for 2 months.

## 2023-10-11 NOTE — Assessment & Plan Note (Addendum)
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 Mammo [Dr.Cintron]- DEc 2024- WNL. Glenda Gomez   # Continue anastrozole  at this time [until spring 2028].  Patient tolerating fairly well except for hot flashes-not any worse. stable.   # HTN- systolic BP 180; at home 130 as per pt. monitor for now [facility] coat]- at home 130s- stable.   # RIGHT UPPER LOBE LUNG CA-stage I-status post resection clear margins- AUG 19th, 2025- Unchanged postoperative appearance of the chest status post right upper lobectomy.  Unchanged small right lower lobe pulmonary nodules, measuring 0.4 cm and smaller, nonspecific and likely benign. No new nodule or mass.  No evidence of lymphadenopathy or metastatic disease in the chest.  Enlargement of the main pulmonary artery, as can be seen in pulmonary hypertension. Annual CT scan for 5 years. Will order imaging at next visit.   # screening for Osteoporosis: JUNE 2023- BMD measured at [facility] -T-score of 0.0. on Vit D; add calcium- stable.   # Obesity  [s/p gastric by pass][stopped Ozempic because of issues]-monitor for now.- stable.   # Hypothyroidism- on synthroid  [PCP]  # mild hypokalemia: likely secondary to triamterene  hydrochlorothiazide .  Recommend increased dietary intake of potassium rich foods- stable.  Non-contrast CT # DISPOSITION: # follow up in 6 months-  MD;labs- cbc/Cmp;vit D-25-OH, B12 levels-   Dr.B  # I reviewed the blood work- with the patient in detail; also reviewed the imaging independently [as summarized above]; and with the patient in detail.

## 2023-10-11 NOTE — Progress Notes (Signed)
 [facility] CONSULT NOTE  Patient Care Team: Sadie Manna, MD as PCP - General (Internal Medicine) Verdene Gills, RN as Oncology Nurse Navigator Kerrin Elspeth BROCKS, MD as Consulting Physician (Cardiothoracic Surgery) Rennie Cindy SAUNDERS, MD as Consulting Physician (Internal Medicine) Volney Lye, MD (Inactive) as Referring Physician (Cardiothoracic Surgery) Dannielle Arlean FALCON, RN (Inactive) as Oncology Nurse Navigator  CHIEF COMPLAINTS/PURPOSE OF CONSULTATION: lung cancer/ Breast cancer  #  Oncology History Overview Note  # JAN 2021- ADENOCA [Dr.A; ENB- jan 2021-]DEC 2020- 13 mm lobular somewhat branching right upper lobe lesion is not hypermetabolic.Number of Lymph Nodes Examined: 17 ; Pathologic Stage Classification (pTNM, AJCC 8th Edition): pT1c, pN0 [Dr.Henderickson]; STAGE I-adenocarcinoma; no adjuvant therapy  # Hx of barrets [[facility] -GI]  # Hx of garstic bypass [1970s]  SPECIMEN  Procedure: Lumpectomy  Specimen Laterality: Right   TUMOR  Histologic Type: Invasive mammary carcinoma, no special type (ductal)  Histologic Grade (Nottingham Histologic Score)                       Glandular (Acinar)/Tubular Differentiation: 1                       Nuclear Pleomorphism: 2                       Mitotic Rate: 1                       Overall Grade: 1  Tumor Size: 7 x 7 x 5 mm  Tumor Focality: Unifocal  Tumor Extent: Not applicable  Ductal Carcinoma In Situ (DCIS): Not identified  Lymphatic and/or Vascular Invasion: Not identified  Treatment Effect in the Breast: No known presurgical treatment         Malignant neoplasm of right upper lobe of lung (HCC)  03/08/2019 Initial Diagnosis   Malignant neoplasm of right upper lobe of lung (HCC)   04/03/2019 Cancer Staging   Staging form: Lung, AJCC 8th Edition - Clinical stage from 04/03/2019: ycT1, cN0, cM0 - Signed by Kerrin Elspeth BROCKS, MD on 04/03/2019   Carcinoma of overlapping sites of right breast in  female, estrogen receptor positive (HCC)  05/25/2021 Initial Diagnosis   Carcinoma of overlapping sites of right breast in female, estrogen receptor positive (HCC)   05/25/2021 Cancer Staging   Staging form: Breast, AJCC 8th Edition - Pathologic: Stage IA (pT1b, pN0, cM0, G1, ER+, PR+, HER2-) - Signed by Rennie Cindy SAUNDERS, MD on 05/25/2021 Histologic grading system: 3 grade system    HISTORY OF PRESENTING ILLNESS: Ambulating independently.  Alone.  Glenda Gomez 73 y.o.  female with right upper lobe stage I lung cancer ; and stage I ER/PR positive breast cancer-s/p RT currently on anastrozole  is here for follow-up/ and review results of the CT scan.   Currently compliant with anastrozole . Patient continues to have multiple joint-hx of arthritis.   Hot flashes not any worse. No new dyspnea or cough.   Review of Systems  Constitutional:  Positive for malaise/fatigue. Negative for chills, diaphoresis, fever and weight loss.  HENT:  Negative for nosebleeds and sore throat.   Eyes:  Negative for double vision.  Respiratory:  Negative for hemoptysis, sputum production, shortness of breath and wheezing.   Cardiovascular:  Negative for chest pain, palpitations, orthopnea and leg swelling.  Gastrointestinal:  Negative for abdominal pain, blood in stool, constipation, diarrhea, heartburn, melena, nausea and vomiting.  Genitourinary:  Negative for dysuria, frequency and urgency.  Musculoskeletal:  Positive for back pain and joint pain.  Skin: Negative.  Negative for itching and rash.  Neurological:  Negative for tingling and focal weakness.  Endo/Heme/Allergies:  Does not bruise/bleed easily.  Psychiatric/Behavioral:  Negative for depression. The patient is not nervous/anxious and does not have insomnia.      MEDICAL HISTORY:  Past Medical History:  Diagnosis Date   Adenocarcinoma (HCC)    right upper lobe   Arthritis    Chicken pox    Dyspnea    Environmental and seasonal allergies     Fibromyalgia    GERD (gastroesophageal reflux disease)    Hemorrhoid    Hypersomnia    Hypertension    Hypothyroidism    Lung cancer (HCC)    Malignant neoplasm of right upper lobe of lung (HCC) 03/08/2019   Myositis ossificans    Osteoarthritis    Polycythemia 07/16/2021   dx by Dr. Sadie   Thyroid disease     SURGICAL HISTORY: Past Surgical History:  Procedure Laterality Date    bengin tongue lesion     Benign 1980's   ABDOMINAL HYSTERECTOMY  1979   BACK SURGERY     bladder tack  2011   BREAST BIOPSY Left    neg   BREAST BIOPSY Right 02/22/2021   u/s bc ribbon 8:00-- INVASIVE MAMMARY CARCINOMA, NO SPECIAL TYPE. Size of invasive carcinoma: 7 mm in this sample. Grade 1. Ductal carcinoma in situ: Present, intermediate grade. Lymphovascular invasion: Not identified   BREAST LUMPECTOMY WITH SENTINEL LYMPH NODE BIOPSY Right 03/05/2021   Procedure: BREAST LUMPECTOMY WITH SENTINEL LYMPH NODE BX;  Surgeon: Dessa Reyes ORN, MD;  Location: [facility] ORS;  Service: General;  Laterality: Right;   CHOLECYSTECTOMY  2010   COLONOSCOPY  12/22/2011   COLONOSCOPY WITH PROPOFOL  N/A 03/24/2017   Procedure: COLONOSCOPY WITH PROPOFOL ;  Surgeon: Gaylyn Gladis PENNER, MD;  Location: [facility] ENDOSCOPY;  Service: Endoscopy;  Laterality: N/A;   COLONOSCOPY WITH PROPOFOL  N/A 07/22/2020   Procedure: COLONOSCOPY WITH PROPOFOL ;  Surgeon: Toledo, Ladell POUR, MD;  Location: [facility] ENDOSCOPY;  Service: Gastroenterology;  Laterality: N/A;   ESOPHAGOGASTRODUODENOSCOPY (EGD) WITH PROPOFOL  N/A 03/24/2017   Procedure: ESOPHAGOGASTRODUODENOSCOPY (EGD) WITH PROPOFOL ;  Surgeon: Gaylyn Gladis PENNER, MD;  Location: [facility] ENDOSCOPY;  Service: Endoscopy;  Laterality: N/A;   ESOPHAGOGASTRODUODENOSCOPY (EGD) WITH PROPOFOL  N/A 07/22/2020   Procedure: ESOPHAGOGASTRODUODENOSCOPY (EGD) WITH PROPOFOL ;  Surgeon: Toledo, Ladell POUR, MD;  Location: [facility] ENDOSCOPY;  Service: Gastroenterology;  Laterality: N/A;   GASTRIC BYPASS OPEN      1970's complicated by herniated stomach requiring reversal with mesh   HEMORRHOID SURGERY     INTERCOSTAL NERVE BLOCK Right 03/29/2019   Procedure: Intercostal Nerve Block;  Surgeon: Kerrin Elspeth BROCKS, MD;  Location: [facility] OR;  Service: Thoracic;  Laterality: Right;   JOINT REPLACEMENT Bilateral    TKR   KNEE ARTHROPLASTY Right 07/06/2016   Procedure: COMPUTER ASSISTED TOTAL KNEE ARTHROPLASTY;  Surgeon: Mardee Lynwood SQUIBB, MD;  Location: [facility] ORS;  Service: Orthopedics;  Laterality: Right;   KNEE ARTHROSCOPY Left 10/19/2006   x 3   KNEE ARTHROSCOPY Right    x2   LOBECTOMY Right 03/29/2019   XI ROBOTIC ASSISTED THORASCOPY- RIGHT UPPER LOBECTOMY (Right Chest)   NODE DISSECTION  03/29/2019   Procedure: Node Dissection;  Surgeon: Kerrin Elspeth BROCKS, MD;  Location: [facility] OR;  Service: Thoracic;;   plantar fascia Left    PLANTAR FASCIA RELEASE Right    x 3  POSTERIOR LAMINECTOMY / DECOMPRESSION LUMBAR SPINE  06/24/2015   SHOULDER ARTHROSCOPY WITH DEBRIDEMENT AND BICEP TENDON REPAIR Left 09/25/2018   Procedure: SHOULDER ARTHROSCOPY WITH DEBRIDEMENT DECOMPRESSION AND BICEP TENDON REPAIR - SCRUB TECH;  Surgeon: Edie Norleen PARAS, MD;  Location: [facility] ORS;  Service: Orthopedics;  Laterality: Left;   TONSILLECTOMY     childhood   TOTAL KNEE ARTHROPLASTY Left 11/17/2004   VIDEO BRONCHOSCOPY WITH ENDOBRONCHIAL NAVIGATION N/A 03/04/2019   Procedure: VIDEO BRONCHOSCOPY WITH ENDOBRONCHIAL NAVIGATION;  Surgeon: Parris Manna, MD;  Location: [facility] ORS;  Service: Thoracic;  Laterality: N/A;   VIDEO BRONCHOSCOPY WITH ENDOBRONCHIAL ULTRASOUND N/A 03/04/2019   Procedure: VIDEO BRONCHOSCOPY WITH ENDOBRONCHIAL ULTRASOUND;  Surgeon: Parris Manna, MD;  Location: [facility] ORS;  Service: Thoracic;  Laterality: N/A;    SOCIAL HISTORY: Social History   Socioeconomic History   Marital status: Divorced    Spouse name: Not on file   Number of children: Not on file   Years of education: Not on file   Highest  education level: Not on file  Occupational History   Not on file  Tobacco Use   Smoking status: Former    Current packs/day: 0.00    Average packs/day: 2.0 packs/day for 10.0 years (20.0 ttl pk-yrs)    Types: Cigarettes    Start date: 07/18/1983    Quit date: 07/17/1993    Years since quitting: 30.2   Smokeless tobacco: Never  Vaping Use   Vaping status: Never Used  Substance and Sexual Activity   Alcohol use: No   Drug use: No   Sexual activity: Not on file  Other Topics Concern   Not on file  Social History Narrative   1ppd- quit > 20 years ago; no alcohol; in [place]; lives self. retd- cook.    Social Drivers of Health   Financial Resource Strain: Medium Risk (09/15/2023)   Received [facility]   Overall Financial Resource Strain (CARDIA)    Difficulty of Paying Living Expenses: Somewhat hard  Food Insecurity: No Food Insecurity (09/15/2023)   [facility]   Hunger Vital Sign    Within the past 12 months, you worried that your food would run out before you got the money to buy more.: Never true    Within the past 12 months, the food you bought just didn't last and you didn't have money to get more.: Never true  Transportation Needs: No Transportation Needs (09/15/2023)   Received from [facility] - Transportation    In the past 12 months, has lack of transportation kept you from medical appointments or from getting medications?: No    Lack of Transportation (Non-Medical): No  Physical Activity: Not on file  Stress: Not on file  Social Connections: Not on file  Intimate Partner Violence: Not on file    FAMILY HISTORY: Family History  Problem Relation Age of Onset   Breast cancer Sister 61   Breast cancer Maternal Aunt 59   Lung cancer Maternal Aunt    Breast cancer Sister 92   Alzheimer's disease Mother    Arthritis Mother    Diabetes Mother    Heart failure Brother    Pancreatic cancer  Brother 22   Lung cancer Brother    Liver cancer Father     ALLERGIES:  is allergic to flagyl [metronidazole], phentermine, and pregabalin.  MEDICATIONS:  Current Outpatient Medications  Medication Sig Dispense Refill   anastrozole  (ARIMIDEX ) 1 MG tablet Take  1 tablet (1 mg total) by mouth daily. 90 tablet 2   aspirin EC 81 MG tablet Take 81 mg by mouth daily. Swallow whole.     FLUoxetine (PROZAC) 10 MG capsule Take 10 mg by mouth daily.     furosemide  (LASIX ) 20 MG tablet Take 20 mg by mouth daily as needed for fluid or edema.     levothyroxine  (SYNTHROID ) 88 MCG tablet Take 88 mcg by mouth daily before breakfast.     losartan  (COZAAR ) 50 MG tablet Take 50 mg by mouth daily.     Multiple Vitamins-Minerals (EYE VITAMINS PO) Take 1 capsule by mouth daily.     omeprazole (PRILOSEC) 20 MG capsule Take 20 mg by mouth 2 (two) times daily before a meal.      oxyCODONE -acetaminophen  (PERCOCET/ROXICET) 5-325 MG tablet 1/2 bid prn     triamterene -hydrochlorothiazide  (MAXZIDE -25) 37.5-25 MG tablet Take 1 tablet by mouth daily. 30 tablet 0   zolpidem  (AMBIEN ) 5 MG tablet Take 5-7.5 mg by mouth at bedtime.     No current facility-administered medications for this visit.   PHYSICAL EXAMINATION: ECOG PERFORMANCE STATUS: 0 - Asymptomatic  Vitals:   10/11/23 0947 10/11/23 1011  BP: (!) 157/75 (!) 170/85  Pulse: 68   Resp: 20   Temp: 98.9 F (37.2 C)   SpO2: 95%      Filed Weights   10/11/23 0947  Weight: 253 lb 3.2 oz (114.9 kg)     Physical Exam Constitutional:      Comments: Obese. She is walking herself.  HENT:     Head: Normocephalic and atraumatic.     Mouth/Throat:     Pharynx: No oropharyngeal exudate.  Eyes:     Pupils: Pupils are equal, round, and reactive to light.  Cardiovascular:     Rate and Rhythm: Normal rate and regular rhythm.  Pulmonary:     Effort: Pulmonary effort is normal. No respiratory distress.     Breath sounds: Normal breath sounds. No wheezing.   Abdominal:     General: Bowel sounds are normal. There is no distension.     Palpations: Abdomen is soft. There is no mass.     Tenderness: There is no abdominal tenderness. There is no guarding or rebound.  Musculoskeletal:        General: No tenderness. Normal range of motion.     Cervical back: Normal range of motion and neck supple.  Skin:    General: Skin is warm.  Neurological:     Mental Status: She is alert and oriented to person, place, and time.  Psychiatric:        Mood and Affect: Affect normal.    LABORATORY DATA:  I have reviewed the data as listed Lab Results  Component Value Date   WBC 5.7 10/11/2023   HGB 13.7 10/11/2023   HCT 41.9 10/11/2023   MCV 83.5 10/11/2023   PLT 263 10/11/2023   Recent Labs    04/12/23 1007 08/20/23 1236 10/11/23 0949  NA 136 140 131*  K 4.1 3.6 3.7  CL 101 103 98  CO2 27 29 22   GLUCOSE 99 112* 127*  BUN 13 11 11   CREATININE 0.90 0.71 0.85  CALCIUM 9.3 9.1 9.1  GFRNONAA >60 >60 >60  PROT  --  6.6 6.7  ALBUMIN  --  3.7 3.8  AST  --  27 34  ALT  --  14 14  ALKPHOS  --  73 84  BILITOT  --  0.7 0.7  RADIOGRAPHIC STUDIES: I have personally reviewed the radiological images as listed and agreed with the findings in the report. CT CHEST WO CONTRAST Result Date: 10/10/2023 CLINICAL DATA:  Non-small-cell lung cancer restaging, status post right upper lobectomy * Tracking Code: BO * EXAM: CT CHEST WITHOUT CONTRAST TECHNIQUE: Multidetector CT imaging of the chest was performed following the standard protocol without IV contrast. RADIATION DOSE REDUCTION: This exam was performed according to the departmental dose-optimization program which includes automated exposure control, adjustment of the mA and/or kV according to patient size and/or use of iterative reconstruction technique. COMPARISON:  08/20/2023 FINDINGS: Cardiovascular: Aortic atherosclerosis. Normal heart size. Left and right coronary artery calcifications. Enlargement of  the main pulmonary artery measuring up to 3.6 cm in caliber. No pericardial effusion. Mediastinum/Nodes: No enlarged mediastinal, hilar, or axillary lymph nodes. Small hiatal hernia. Thyroid gland, trachea, and esophagus demonstrate no significant findings. Lungs/Pleura: Unchanged postoperative appearance of the chest status post right upper lobectomy. Unchanged small right lower lobe pulmonary nodules measuring 0.4 cm peripherally (series 3, image 61) and 0.4 cm anteriorly (series 3, image 81). No pleural effusion or pneumothorax. Upper Abdomen: No acute abnormality. Musculoskeletal: No chest wall abnormality. No acute osseous findings. IMPRESSION: 1. Unchanged postoperative appearance of the chest status post right upper lobectomy. 2. Unchanged small right lower lobe pulmonary nodules, measuring 0.4 cm and smaller, nonspecific and likely benign. No new nodule or mass. 3. No evidence of lymphadenopathy or metastatic disease in the chest. 4. Enlargement of the main pulmonary artery, as can be seen in pulmonary hypertension. 5. Coronary artery disease. Aortic Atherosclerosis (ICD10-I70.0). Electronically Signed   By: Marolyn JONETTA Jaksch M.D.   On: 10/10/2023 16:12     ASSESSMENT & PLAN:   Carcinoma of overlapping sites of right breast in female, estrogen receptor positive (HCC) # JAN 2023- Right breast invasive mammary carcinoma -PT1BN0 ER/PR positive HER2 negative- INVASIVE MAMMARY CARCINOMA, NO SPECIAL TYPE; grade 1; given low risk-okay to hold off Oncotype; s/p RT- Tolerating fairly well. Mammo [Dr.Cintron]- DEc 2024- WNL. SABRA   # Continue anastrozole  at this time [until spring 2028].  Patient tolerating fairly well except for hot flashes-not any worse. stable.   # HTN- systolic BP 180; at home 130 as per pt. monitor for now [facility] coat]- at home 130s- stable.   # RIGHT UPPER LOBE LUNG CA-stage I-status post resection clear margins- AUG 19th, 2025- Unchanged postoperative appearance of the chest status post  right upper lobectomy.  Unchanged small right lower lobe pulmonary nodules, measuring 0.4 cm and smaller, nonspecific and likely benign. No new nodule or mass.  No evidence of lymphadenopathy or metastatic disease in the chest.  Enlargement of the main pulmonary artery, as can be seen in pulmonary hypertension. Annual CT scan for 5 years. Will order imaging at next visit.   # screening for Osteoporosis: JUNE 2023- BMD measured at [facility] -T-score of 0.0. on Vit D; add calcium- stable.   # Obesity  [s/p gastric by pass][stopped Ozempic because of issues]-monitor for now.- stable.   # Hypothyroidism- on synthroid  [PCP]  # mild hypokalemia: likely secondary to triamterene  hydrochlorothiazide .  Recommend increased dietary intake of potassium rich foods- stable.  Non-contrast CT # DISPOSITION: # follow up in 6 months-  MD;labs- cbc/Cmp;vit D-25-OH, B12 levels-   Dr.B  # I reviewed the blood work- with the patient in detail; also reviewed the imaging independently [as summarized above]; and with the patient in detail.     All questions were answered. The patient  knows to call the clinic with any problems, questions or concerns.    Cindy JONELLE Joe, MD 10/11/2023 10:42 AM

## 2023-12-14 ENCOUNTER — Other Ambulatory Visit: Payer: Self-pay | Admitting: General Surgery

## 2023-12-14 DIAGNOSIS — Z853 Personal history of malignant neoplasm of breast: Secondary | ICD-10-CM

## 2024-01-04 ENCOUNTER — Encounter: Payer: Self-pay | Admitting: Ophthalmology

## 2024-01-04 NOTE — Anesthesia Preprocedure Evaluation (Addendum)
 Anesthesia Evaluation  Patient identified by MRN, date of birth, ID band Patient awake    Reviewed: Allergy & Precautions, H&P , NPO status , Patient's Chart, lab work & pertinent test results  Airway Mallampati: III  TM Distance: >3 FB Neck ROM: Full    Dental no notable dental hx. (+) Poor Dentition, Missing, Caps, Chipped Cap left upper #12:   Pulmonary shortness of breath, former smoker   Pulmonary exam normal breath sounds clear to auscultation       Cardiovascular hypertension, Normal cardiovascular exam+ dysrhythmias  Rhythm:Regular Rate:Normal  11-14-23 echo I97.89- Other postprocedural complications and d.                                    I48.91- Unspecified atrial fibrillation (CMS/HHS.                                    R06.02- Shortness of breath.                                                         I47.10- Supraventricular tachycardia, unspecifie.                                    R94.31- Abnormal electrocardiogram (ECG) (EKG).                                       CONCLUSION -------------------------------------------------------------------------------  NORMAL LEFT VENTRICULAR SYSTOLIC FUNCTION WITH MILD LVH  ESTIMATED EF: >55%  NORMAL LA PRESSURES WITH NORMAL DIASTOLIC FUNCTION  NORMAL RIGHT VENTRICULAR SYSTOLIC FUNCTION  VALVULAR REGURGITATION: No AR, TRIVIAL MR, No PR, MILD TR  ESTIMATED RVSP: 30 mmHg (Normal)  NO VALVULAR STENOSIS     Neuro/Psych  Headaches PSYCHIATRIC DISORDERS  Depression     Neuromuscular disease negative neurological ROS  negative psych ROS   GI/Hepatic negative GI ROS, Neg liver ROS,GERD  ,,  Endo/Other  negative endocrine ROSHypothyroidism    Renal/GU negative Renal ROS  negative genitourinary   Musculoskeletal negative musculoskeletal ROS (+) Arthritis ,  Fibromyalgia -  Abdominal   Peds negative pediatric ROS (+)  Hematology negative hematology ROS (+)    Anesthesia Other Findings Medical History  Hypertension  Hypersomnia Myositis ossificans  Arthritis Thyroid disease  GERD (gastroesophageal reflux disease) Fibromyalgia  Osteoarthritis Hemorrhoid  Hypothyroidism  Dyspnea Environmental and seasonal allergies Lung cancer (HCC) Malignant neoplasm of right upper lobe of lung (HCC)  Adenocarcinoma (HCC) Polycythemia  Dysrhythmia Headache  Depression Supraventricular tachycardia  Atrial fibrillation (HCC) Mild pulmonary hypertension (HCC)  Mild tricuspid regurgitation by prior echocardiogram Status post right breast lumpectomy  History of sentinel lymph node dissection right Barrett's esophagus  S/P lobectomy of lung right 2021 History of gastric bypass open 1970s History of total knee arthroplasty, right History of total knee arthroplasty, left     Reproductive/Obstetrics negative OB ROS  Anesthesia Physical Anesthesia Plan  ASA: 3  Anesthesia Plan: MAC   Post-op Pain Management:    Induction: Intravenous  PONV Risk Score and Plan:   Airway Management Planned: Natural Airway and Nasal Cannula  Additional Equipment:   Intra-op Plan:   Post-operative Plan:   Informed Consent: I have reviewed the patients History and Physical, chart, labs and discussed the procedure including the risks, benefits and alternatives for the proposed anesthesia with the patient or authorized representative who has indicated his/her understanding and acceptance.     Dental Advisory Given  Plan Discussed with: Anesthesiologist, CRNA and Surgeon  Anesthesia Plan Comments: (Patient consented for risks of anesthesia including but not limited to:  - adverse reactions to medications - damage to eyes, teeth, lips or other oral mucosa - nerve damage due to positioning  - sore throat or hoarseness - Damage to heart, brain, nerves, lungs, other parts of body or loss of  life  Patient voiced understanding and assent.)         Anesthesia Quick Evaluation

## 2024-01-04 NOTE — Discharge Instructions (Signed)

## 2024-01-08 ENCOUNTER — Ambulatory Visit: Payer: Self-pay | Admitting: Anesthesiology

## 2024-01-08 ENCOUNTER — Encounter
Admission: RE | Disposition: A | Discharge status: Home / Self Care | Payer: Self-pay | Source: Home / Self Care | Attending: Ophthalmology

## 2024-01-08 ENCOUNTER — Other Ambulatory Visit: Payer: Self-pay

## 2024-01-08 ENCOUNTER — Ambulatory Visit
Admission: RE | Admit: 2024-01-08 | Discharge: 2024-01-08 | Disposition: A | Discharge status: Home / Self Care | Attending: Ophthalmology | Admitting: Ophthalmology

## 2024-01-08 ENCOUNTER — Encounter: Payer: Self-pay | Admitting: Ophthalmology

## 2024-01-08 DIAGNOSIS — K219 Gastro-esophageal reflux disease without esophagitis: Secondary | ICD-10-CM | POA: Insufficient documentation

## 2024-01-08 DIAGNOSIS — Z9884 Bariatric surgery status: Secondary | ICD-10-CM | POA: Diagnosis not present

## 2024-01-08 DIAGNOSIS — R519 Headache, unspecified: Secondary | ICD-10-CM | POA: Diagnosis not present

## 2024-01-08 DIAGNOSIS — Z87891 Personal history of nicotine dependence: Secondary | ICD-10-CM | POA: Diagnosis not present

## 2024-01-08 DIAGNOSIS — Z79890 Hormone replacement therapy (postmenopausal): Secondary | ICD-10-CM | POA: Insufficient documentation

## 2024-01-08 DIAGNOSIS — I10 Essential (primary) hypertension: Secondary | ICD-10-CM | POA: Insufficient documentation

## 2024-01-08 DIAGNOSIS — E039 Hypothyroidism, unspecified: Secondary | ICD-10-CM | POA: Diagnosis not present

## 2024-01-08 DIAGNOSIS — Z791 Long term (current) use of non-steroidal anti-inflammatories (NSAID): Secondary | ICD-10-CM | POA: Insufficient documentation

## 2024-01-08 DIAGNOSIS — F32A Depression, unspecified: Secondary | ICD-10-CM | POA: Insufficient documentation

## 2024-01-08 DIAGNOSIS — Z7982 Long term (current) use of aspirin: Secondary | ICD-10-CM | POA: Diagnosis not present

## 2024-01-08 DIAGNOSIS — Z79899 Other long term (current) drug therapy: Secondary | ICD-10-CM | POA: Insufficient documentation

## 2024-01-08 DIAGNOSIS — M797 Fibromyalgia: Secondary | ICD-10-CM | POA: Diagnosis not present

## 2024-01-08 DIAGNOSIS — M1990 Unspecified osteoarthritis, unspecified site: Secondary | ICD-10-CM | POA: Diagnosis not present

## 2024-01-08 DIAGNOSIS — H2511 Age-related nuclear cataract, right eye: Secondary | ICD-10-CM | POA: Insufficient documentation

## 2024-01-08 DIAGNOSIS — R0602 Shortness of breath: Secondary | ICD-10-CM | POA: Diagnosis not present

## 2024-01-08 HISTORY — DX: Unspecified atrial fibrillation: I48.91

## 2024-01-08 HISTORY — DX: Supraventricular tachycardia, unspecified: I47.10

## 2024-01-08 HISTORY — DX: Pulmonary hypertension, unspecified: I27.20

## 2024-01-08 HISTORY — PX: CATARACT EXTRACTION W/PHACO: SHX586

## 2024-01-08 HISTORY — DX: Barrett's esophagus without dysplasia: K22.70

## 2024-01-08 HISTORY — DX: Cardiac arrhythmia, unspecified: I49.9

## 2024-01-08 HISTORY — DX: Depression, unspecified: F32.A

## 2024-01-08 HISTORY — DX: Rheumatic tricuspid insufficiency: I07.1

## 2024-01-08 HISTORY — DX: Headache, unspecified: R51.9

## 2024-01-08 SURGERY — PHACOEMULSIFICATION, CATARACT, WITH IOL INSERTION
Anesthesia: Monitor Anesthesia Care | Site: Eye | Laterality: Right

## 2024-01-08 MED ORDER — PHENYLEPHRINE HCL 10 % OP SOLN
OPHTHALMIC | Status: AC
  Filled 2024-01-08: qty 5

## 2024-01-08 MED ORDER — CYCLOPENTOLATE HCL 2 % OP SOLN
OPHTHALMIC | Status: AC
  Filled 2024-01-08: qty 2

## 2024-01-08 MED ORDER — SIGHTPATH DOSE#1 BSS IO SOLN
INTRAOCULAR | Status: DC | PRN
Start: 2024-01-08 — End: 2024-01-08
  Administered 2024-01-08: 15 mL via INTRAOCULAR

## 2024-01-08 MED ORDER — LIDOCAINE HCL (PF) 2 % IJ SOLN
INTRAOCULAR | Status: DC | PRN
  Administered 2024-01-08: 4 mL via INTRAOCULAR

## 2024-01-08 MED ORDER — FENTANYL CITRATE (PF) 100 MCG/2ML IJ SOLN
INTRAMUSCULAR | Status: AC
  Filled 2024-01-08: qty 2

## 2024-01-08 MED ORDER — MIDAZOLAM HCL (PF) 2 MG/2ML IJ SOLN
INTRAMUSCULAR | Status: DC | PRN
  Administered 2024-01-08 (×2): 1 mg via INTRAVENOUS

## 2024-01-08 MED ORDER — CYCLOPENTOLATE HCL 2 % OP SOLN
1.0000 [drp] | OPHTHALMIC | Status: AC
Start: 2024-01-08 — End: 2024-01-08
  Administered 2024-01-08 (×3): 1 [drp] via OPHTHALMIC

## 2024-01-08 MED ORDER — FENTANYL CITRATE (PF) 100 MCG/2ML IJ SOLN
INTRAMUSCULAR | Status: DC | PRN
  Administered 2024-01-08 (×2): 50 ug via INTRAVENOUS

## 2024-01-08 MED ORDER — TETRACAINE HCL 0.5 % OP SOLN
OPHTHALMIC | Status: AC
  Filled 2024-01-08: qty 4

## 2024-01-08 MED ORDER — TETRACAINE HCL 0.5 % OP SOLN
1.0000 [drp] | OPHTHALMIC | Status: DC | PRN
  Administered 2024-01-08 (×3): 1 [drp] via OPHTHALMIC

## 2024-01-08 MED ORDER — LACTATED RINGERS IV SOLN: INTRAVENOUS | Status: DC

## 2024-01-08 MED ORDER — EPINEPHRINE PF 1 MG/ML IJ SOLN
INTRAMUSCULAR | Status: DC | PRN
  Administered 2024-01-08: 89 mL via OPHTHALMIC

## 2024-01-08 MED ORDER — MOXIFLOXACIN HCL 0.5 % OP SOLN
OPHTHALMIC | Status: DC | PRN
  Administered 2024-01-08: .2 mL via OPHTHALMIC

## 2024-01-08 MED ORDER — SIGHTPATH DOSE#1 NA HYALUR & NA CHOND-NA HYALUR IO KIT
PACK | INTRAOCULAR | Status: DC | PRN
  Administered 2024-01-08: 1 via OPHTHALMIC

## 2024-01-08 MED ORDER — PHENYLEPHRINE HCL 10 % OP SOLN
1.0000 [drp] | OPHTHALMIC | Status: AC
  Administered 2024-01-08 (×3): 1 [drp] via OPHTHALMIC

## 2024-01-08 MED ORDER — MIDAZOLAM HCL 2 MG/2ML IJ SOLN
INTRAMUSCULAR | Status: AC
Start: 2024-01-08 — End: 2024-01-08
  Filled 2024-01-08: qty 2

## 2024-01-08 SURGICAL SUPPLY — 9 items
DISSECTOR HYDRO NUCLEUS 50X22 (MISCELLANEOUS) ×1 IMPLANT
FEE CATARACT SUITE SIGHTPATH (MISCELLANEOUS) ×1 IMPLANT
GLOVE PI ULTRA LF STRL 7.5 (GLOVE) ×1 IMPLANT
GLOVE SURG SYN 6.5 PF PI BL (GLOVE) ×1 IMPLANT
GLOVE SURG SYN 8.5 PF PI BL (GLOVE) ×1 IMPLANT
LENS IOL TECNIS EYHANCE 21.5 (Intraocular Lens) IMPLANT
NDL FILTER BLUNT 18X1 1/2 (NEEDLE) ×1 IMPLANT
NEEDLE FILTER BLUNT 18X1 1/2 (NEEDLE) ×1 IMPLANT
SYR 3ML LL SCALE MARK (SYRINGE) ×1 IMPLANT

## 2024-01-08 NOTE — Anesthesia Postprocedure Evaluation (Signed)
 Anesthesia Post Note  Patient: Khamiya C Kosman  Procedure(s) Performed: PHACOEMULSIFICATION, CATARACT, WITH IOL INSERTION 3.44 00:34.8 (Right: Eye)  Patient location during evaluation: PACU Anesthesia Type: MAC Level of consciousness: awake and alert Pain management: pain level controlled Vital Signs Assessment: post-procedure vital signs reviewed and stable Respiratory status: spontaneous breathing, nonlabored ventilation, respiratory function stable and patient connected to nasal cannula oxygen Cardiovascular status: stable and blood pressure returned to baseline Postop Assessment: no apparent nausea or vomiting Anesthetic complications: no   No notable events documented.   Last Vitals:  Vitals:   01/08/24 1251 01/08/24 1253  BP:  (!) 156/68  Pulse: (!) 46 (!) 47  Resp: 13 12  Temp: 37.1 C   SpO2: 99% 98%    Last Pain:  Vitals:   01/08/24 1253  TempSrc:   PainSc: 0-No pain                 Skylen Spiering C Carline Dura

## 2024-01-08 NOTE — H&P (Signed)
 [facility]   Primary Care Physician:  Sadie Manna, MD Ophthalmologist: Dr. Adine Novak  Pre-Procedure History & Physical: HPI:  Glenda Gomez is a 73 y.o. female here for cataract surgery.   Past Medical History:  Diagnosis Date   Adenocarcinoma (HCC)    right upper lobe   Arthritis    Atrial fibrillation (HCC)    Barrett's esophagus    Chicken pox    Depression    Dyspnea    with exertion from upper right lung removal   Dysrhythmia    A-fib occaisionally   Environmental and seasonal allergies    Fibromyalgia    GERD (gastroesophageal reflux disease)    Headache    from back pain   Hemorrhoid    History of gastric bypass 1970   open procedure   History of sentinel lymph node dissection 03/05/2021   right   History of total knee arthroplasty, left 11/17/2004   History of total knee arthroplasty, right 07/06/2016   Hypersomnia    Hypertension    Hypothyroidism    Lung cancer (HCC)    Malignant neoplasm of right upper lobe of lung (HCC) 03/08/2019   Mild pulmonary hypertension (HCC)    Mild tricuspid regurgitation by prior echocardiogram    Myositis ossificans    Osteoarthritis    Polycythemia 07/16/2021   dx by Dr. Sadie   S/P lobectomy of lung 2021   right upper lobe   Status post right breast lumpectomy 03/05/2021   Supraventricular tachycardia    Thyroid disease     Past Surgical History:  Procedure Laterality Date    bengin tongue lesion     Benign 1980's   ABDOMINAL HYSTERECTOMY  1979   BACK SURGERY     bladder tack  2011   BREAST BIOPSY Left    neg   BREAST BIOPSY Right 02/22/2021   u/s bc ribbon 8:00-- INVASIVE MAMMARY CARCINOMA, NO SPECIAL TYPE. Size of invasive carcinoma: 7 mm in this sample. Grade 1. Ductal carcinoma in situ: Present, intermediate grade. Lymphovascular invasion: Not identified   BREAST LUMPECTOMY WITH SENTINEL LYMPH NODE BIOPSY Right 03/05/2021   Procedure: BREAST LUMPECTOMY WITH SENTINEL LYMPH NODE BX;  Surgeon:  Dessa Reyes ORN, MD;  Location: [facility] ORS;  Service: General;  Laterality: Right;   CHOLECYSTECTOMY  2010   COLONOSCOPY  12/22/2011   COLONOSCOPY WITH PROPOFOL  N/A 03/24/2017   Procedure: COLONOSCOPY WITH PROPOFOL ;  Surgeon: Gaylyn Gladis PENNER, MD;  Location: [facility] ENDOSCOPY;  Service: Endoscopy;  Laterality: N/A;   COLONOSCOPY WITH PROPOFOL  N/A 07/22/2020   Procedure: COLONOSCOPY WITH PROPOFOL ;  Surgeon: Toledo, Ladell POUR, MD;  Location: [facility] ENDOSCOPY;  Service: Gastroenterology;  Laterality: N/A;   ESOPHAGOGASTRODUODENOSCOPY (EGD) WITH PROPOFOL  N/A 03/24/2017   Procedure: ESOPHAGOGASTRODUODENOSCOPY (EGD) WITH PROPOFOL ;  Surgeon: Gaylyn Gladis PENNER, MD;  Location: [facility] ENDOSCOPY;  Service: Endoscopy;  Laterality: N/A;   ESOPHAGOGASTRODUODENOSCOPY (EGD) WITH PROPOFOL  N/A 07/22/2020   Procedure: ESOPHAGOGASTRODUODENOSCOPY (EGD) WITH PROPOFOL ;  Surgeon: Toledo, Ladell POUR, MD;  Location: [facility] ENDOSCOPY;  Service: Gastroenterology;  Laterality: N/A;   GASTRIC BYPASS OPEN     1970's complicated by herniated stomach requiring reversal with mesh   HEMORRHOID SURGERY     INTERCOSTAL NERVE BLOCK Right 03/29/2019   Procedure: Intercostal Nerve Block;  Surgeon: Kerrin Elspeth JAYSON, MD;  Location: [facility] OR;  Service: Thoracic;  Laterality: Right;   JOINT REPLACEMENT Bilateral    TKR   KNEE ARTHROPLASTY Right 07/06/2016   Procedure: COMPUTER ASSISTED TOTAL KNEE ARTHROPLASTY;  Surgeon:  Hooten, Lynwood SQUIBB, MD;  Location: [facility] ORS;  Service: Orthopedics;  Laterality: Right;   KNEE ARTHROSCOPY Left 10/19/2006   x 3   KNEE ARTHROSCOPY Right    x2   LOBECTOMY Right 03/29/2019   XI ROBOTIC ASSISTED THORASCOPY- RIGHT UPPER LOBECTOMY (Right Chest)   NODE DISSECTION  03/29/2019   Procedure: Node Dissection;  Surgeon: Kerrin Elspeth BROCKS, MD;  Location: [facility] OR;  Service: Thoracic;;   plantar fascia Left    PLANTAR FASCIA RELEASE Right    x 3   POSTERIOR LAMINECTOMY / DECOMPRESSION LUMBAR SPINE  06/24/2015    SHOULDER ARTHROSCOPY WITH DEBRIDEMENT AND BICEP TENDON REPAIR Left 09/25/2018   Procedure: SHOULDER ARTHROSCOPY WITH DEBRIDEMENT DECOMPRESSION AND BICEP TENDON REPAIR - SCRUB TECH;  Surgeon: Edie Norleen PARAS, MD;  Location: [facility] ORS;  Service: Orthopedics;  Laterality: Left;   TONSILLECTOMY     childhood   TOTAL KNEE ARTHROPLASTY Left 11/17/2004   VIDEO BRONCHOSCOPY WITH ENDOBRONCHIAL NAVIGATION N/A 03/04/2019   Procedure: VIDEO BRONCHOSCOPY WITH ENDOBRONCHIAL NAVIGATION;  Surgeon: Parris Manna, MD;  Location: [facility] ORS;  Service: Thoracic;  Laterality: N/A;   VIDEO BRONCHOSCOPY WITH ENDOBRONCHIAL ULTRASOUND N/A 03/04/2019   Procedure: VIDEO BRONCHOSCOPY WITH ENDOBRONCHIAL ULTRASOUND;  Surgeon: Parris Manna, MD;  Location: [facility] ORS;  Service: Thoracic;  Laterality: N/A;    Prior to Admission medications   Medication Sig Start Date End Date Taking? Authorizing Provider  anastrozole  (ARIMIDEX ) 1 MG tablet Take 1 tablet (1 mg total) by mouth daily. 10/11/23  Yes Brahmanday, Govinda R, MD  aspirin EC 81 MG tablet Take 81 mg by mouth daily. Swallow whole.   Yes [provider]  atorvastatin (LIPITOR) 10 MG tablet Take 10 mg by mouth daily.   Yes [provider]  celecoxib  (CELEBREX ) 200 MG capsule Take 200 mg by mouth 2 (two) times daily.   Yes [provider]  cyclobenzaprine  (FLEXERIL ) 10 MG tablet Take 10 mg by mouth daily.   Yes [provider]  furosemide  (LASIX ) 20 MG tablet Take 20 mg by mouth daily as needed for fluid or edema.   Yes [provider]  levothyroxine  (SYNTHROID ) 88 MCG tablet Take 88 mcg by mouth daily before breakfast. 03/04/20  Yes [provider]  losartan  (COZAAR ) 50 MG tablet Take 50 mg by mouth daily.   Yes [provider]  Multiple Vitamins-Minerals (EYE VITAMINS PO) Take 1 capsule by mouth daily.   Yes [provider]  omeprazole (PRILOSEC) 20 MG capsule Take 20 mg by mouth 2 (two) times daily  before a meal.    Yes [provider]  oxyCODONE -acetaminophen  (PERCOCET/ROXICET) 5-325 MG tablet 1/2 bid prn 08/30/23  Yes [provider]  predniSONE (DELTASONE) 10 MG tablet Take 10 mg by mouth as needed.   Yes [provider]  traMADol  (ULTRAM ) 50 MG tablet Take 50 mg by mouth 2 (two) times daily.   Yes [provider]  triamterene -hydrochlorothiazide  (MAXZIDE -25) 37.5-25 MG tablet Take 1 tablet by mouth daily. 08/20/23  Yes Mumma, Clotilda, MD  zolpidem  (AMBIEN ) 5 MG tablet Take 5-7.5 mg by mouth at bedtime.   Yes [provider]  FLUoxetine (PROZAC) 10 MG capsule Take 10 mg by mouth daily. 06/22/22 10/11/23  [provider]    Allergies as of 12/25/2023 - Review Complete 10/11/2023  Allergen Reaction Noted   Flagyl [metronidazole] Diarrhea and Nausea And Vomiting 07/17/2013   Phentermine Other (See Comments) 07/21/2020   Pregabalin Other (See Comments) 07/17/2013    Family History  Problem Relation Age of Onset   Breast cancer Sister 52   Breast cancer Maternal Aunt 29   Lung cancer Maternal Aunt    Breast cancer Sister 73   Alzheimer's disease Mother    Arthritis Mother    Diabetes Mother    Heart failure Brother    Pancreatic cancer Brother 64   Lung cancer Brother    Liver cancer Father     Social History   Socioeconomic History   Marital status: Divorced    Spouse name: Not on file   Number of children: Not on file   Years of education: Not on file   Highest education level: Not on file  Occupational History   Not on file  Tobacco Use   Smoking status: Former    Current packs/day: 0.00    Average packs/day: 2.0 packs/day for 10.0 years (20.0 ttl pk-yrs)    Types: Cigarettes    Start date: 07/18/1983    Quit date: 07/17/1993    Years since quitting: 30.4   Smokeless tobacco: Never  Vaping Use   Vaping status: Never Used  Substance and Sexual Activity   Alcohol use: No   Drug use: Never   Sexual activity: Not on  file  Other Topics Concern   Not on file  Social History Narrative   1ppd- quit > 20 years ago; no alcohol; in [place]; lives self. retd- cook.    Social Drivers of Corporate Investment Banker Strain: Low Risk  (11/14/2023)   Received from [facility]   Overall Financial Resource Strain (CARDIA)    Difficulty of Paying Living Expenses: Not hard at all  Recent Concern: Financial Resource Strain - Medium Risk (09/15/2023)   Received from [facility]   Overall Financial Resource Strain (CARDIA)    Difficulty of Paying Living Expenses: Somewhat hard  Food Insecurity: No Food Insecurity (11/14/2023)   Received [facility]   Hunger Vital Sign    Within the past 12 months, you worried that your food would run out before you got the money to buy more.: Never true    Within the past 12 months, the food you bought just didn't last and you didn't have money to get more.: Never true  Transportation Needs: No Transportation Needs (11/14/2023)   Received from [facility] - Transportation    In the past 12 months, has lack of transportation kept you from medical appointments or from getting medications?: No    Lack of Transportation (Non-Medical): No  Physical Activity: Not on file  Stress: Not on file  Social Connections: Not on file  Intimate Partner Violence: Not on file    Review of Systems: See HPI, otherwise negative ROS  Physical Exam: BP (!) 182/72   Pulse (!) 58   Temp 98.4 F (36.9 C) (Temporal)   Resp 16   Ht 5' 2 (1.575 m)   Wt 115.2 kg   SpO2 97%   BMI 46.46 kg/m  General:   Alert, cooperative. Head:  Normocephalic and atraumatic. Respiratory:  Normal work of breathing. Cardiovascular:  NAD  Impression/Plan: Glenda Gomez is here for cataract surgery.  Risks, benefits, limitations, and alternatives regarding cataract surgery have been reviewed with the patient.  Questions have been  answered.  All parties agreeable.   Adine Novak, MD  01/08/2024, 12:23 PM

## 2024-01-08 NOTE — Transfer of Care (Signed)
 Immediate Anesthesia Transfer of Care Note  Patient: Robbie C Maeda  Procedure(s) Performed: PHACOEMULSIFICATION, CATARACT, WITH IOL INSERTION 3.44 00:34.8 (Right: Eye)  Patient Location: PACU  Anesthesia Type: MAC  Level of Consciousness: awake, alert  and patient cooperative  Airway and Oxygen Therapy: Patient Spontanous Breathing and Patient connected to supplemental oxygen  Post-op Assessment: Post-op Vital signs reviewed, Patient's Cardiovascular Status Stable, Respiratory Function Stable, Patent Airway and No signs of Nausea or vomiting  Post-op Vital Signs: Reviewed and stable  Complications: No notable events documented.

## 2024-01-08 NOTE — Op Note (Signed)
 OPERATIVE NOTE  Glenda Gomez [id number] 01/08/2024   PREOPERATIVE DIAGNOSIS:  Nuclear sclerotic cataract right eye.  H25.11   POSTOPERATIVE DIAGNOSIS:    Nuclear sclerotic cataract right eye.     PROCEDURE:  Phacoemusification with posterior chamber intraocular lens placement of the right eye   LENS:   Implant Name Type Inv. Item Serial No. Manufacturer Lot No. LRB No. Used Action  LENS IOL TECNIS EYHANCE 21.5 - [id number] Intraocular Lens LENS IOL TECNIS EYHANCE 21.5 [id number] [employer]  Right 1 Implanted       Procedure(s): PHACOEMULSIFICATION, CATARACT, WITH IOL INSERTION 3.44 00:34.8 (Right)  SURGEON:  Adine Novak, MD, MPH  ANESTHESIOLOGIST: Anesthesiologist: Ola Donny JAYSON, MD CRNA: Jahoo, Sonia, CRNA   ANESTHESIA:  Topical with tetracaine  drops augmented with 1% preservative-free intracameral lidocaine .  ESTIMATED BLOOD LOSS: less than 1 mL.   COMPLICATIONS:  None.   DESCRIPTION OF PROCEDURE:  The patient was identified in the holding room and transported to the operating room and placed in the supine position under the operating microscope.  The right eye was identified as the operative eye and it was prepped and draped in the usual sterile ophthalmic fashion.   A 1.0 millimeter clear-corneal paracentesis was made at the 10:30 position. 0.5 ml of preservative-free 1% lidocaine  with epinephrine  was injected into the anterior chamber.  The anterior chamber was filled with viscoelastic.  A 2.4 millimeter keratome was used to make a near-clear corneal incision at the 8:00 position.  A curvilinear capsulorrhexis was made with a cystotome and capsulorrhexis forceps.  Balanced salt solution was used to hydrodissect and hydrodelineate the nucleus.   Phacoemulsification was then used in stop and chop fashion to remove the lens nucleus and epinucleus.  The remaining cortex was then removed using the irrigation and aspiration handpiece. Viscoelastic was then placed into the  capsular bag to distend it for lens placement.  A lens was then injected into the capsular bag.  The remaining viscoelastic was aspirated.   Wounds were hydrated with balanced salt solution.  The anterior chamber was inflated to a physiologic pressure with balanced salt solution.   Intracameral vigamox  0.1 mL undiluted was injected into the eye and a drop placed onto the ocular surface.  No wound leaks were noted.  The patient was taken to the recovery room in stable condition without complications of anesthesia or surgery  Adine Novak 01/08/2024, 12:48 PM

## 2024-01-16 NOTE — Discharge Instructions (Signed)

## 2024-01-22 ENCOUNTER — Encounter
Admission: RE | Disposition: A | Discharge status: Home / Self Care | Payer: Self-pay | Source: Home / Self Care | Attending: Ophthalmology

## 2024-01-22 ENCOUNTER — Other Ambulatory Visit: Payer: Self-pay

## 2024-01-22 ENCOUNTER — Ambulatory Visit: Payer: Self-pay | Admitting: Anesthesiology

## 2024-01-22 ENCOUNTER — Encounter: Payer: Self-pay | Admitting: Ophthalmology

## 2024-01-22 ENCOUNTER — Ambulatory Visit
Admission: RE | Admit: 2024-01-22 | Discharge: 2024-01-22 | Disposition: A | Discharge status: Home / Self Care | Attending: Ophthalmology | Admitting: Ophthalmology

## 2024-01-22 HISTORY — PX: CATARACT EXTRACTION W/PHACO: SHX586

## 2024-01-22 SURGERY — PHACOEMULSIFICATION, CATARACT, WITH IOL INSERTION
Anesthesia: Monitor Anesthesia Care | Site: Eye | Laterality: Left

## 2024-01-22 MED ORDER — MIDAZOLAM HCL 2 MG/2ML IJ SOLN
INTRAMUSCULAR | Status: AC
  Filled 2024-01-22: qty 2

## 2024-01-22 MED ORDER — FENTANYL CITRATE (PF) 100 MCG/2ML IJ SOLN
INTRAMUSCULAR | Status: DC | PRN
  Administered 2024-01-22: 50 ug via INTRAVENOUS

## 2024-01-22 MED ORDER — PHENYLEPHRINE HCL 10 % OP SOLN
OPHTHALMIC | Status: AC
  Filled 2024-01-22: qty 5

## 2024-01-22 MED ORDER — SIGHTPATH DOSE#1 BSS IO SOLN
INTRAOCULAR | Status: DC | PRN
  Administered 2024-01-22: 76 mL via OPHTHALMIC

## 2024-01-22 MED ORDER — LIDOCAINE HCL (PF) 2 % IJ SOLN
INTRAOCULAR | Status: DC | PRN
  Administered 2024-01-22: 4 mL via INTRAOCULAR

## 2024-01-22 MED ORDER — MOXIFLOXACIN HCL 0.5 % OP SOLN
OPHTHALMIC | Status: DC | PRN
  Administered 2024-01-22: .2 mL via OPHTHALMIC

## 2024-01-22 MED ORDER — LACTATED RINGERS IV SOLN: INTRAVENOUS | Status: DC

## 2024-01-22 MED ORDER — SIGHTPATH DOSE#1 BSS IO SOLN
INTRAOCULAR | Status: DC | PRN
  Administered 2024-01-22: 15 mL via INTRAOCULAR

## 2024-01-22 MED ORDER — TETRACAINE HCL 0.5 % OP SOLN
OPHTHALMIC | Status: AC
  Filled 2024-01-22: qty 4

## 2024-01-22 MED ORDER — SIGHTPATH DOSE#1 NA HYALUR & NA CHOND-NA HYALUR IO KIT
PACK | INTRAOCULAR | Status: DC | PRN
  Administered 2024-01-22: 1 via OPHTHALMIC

## 2024-01-22 MED ORDER — TETRACAINE HCL 0.5 % OP SOLN
1.0000 [drp] | OPHTHALMIC | Status: DC | PRN
  Administered 2024-01-22 (×3): 1 [drp] via OPHTHALMIC

## 2024-01-22 MED ORDER — CYCLOPENTOLATE HCL 2 % OP SOLN
1.0000 [drp] | OPHTHALMIC | Status: AC
  Administered 2024-01-22 (×3): 1 [drp] via OPHTHALMIC

## 2024-01-22 MED ORDER — CYCLOPENTOLATE HCL 2 % OP SOLN
OPHTHALMIC | Status: AC
  Filled 2024-01-22: qty 2

## 2024-01-22 MED ORDER — PHENYLEPHRINE HCL 10 % OP SOLN
1.0000 [drp] | OPHTHALMIC | Status: AC
  Administered 2024-01-22 (×3): 1 [drp] via OPHTHALMIC

## 2024-01-22 MED ORDER — FENTANYL CITRATE (PF) 100 MCG/2ML IJ SOLN
INTRAMUSCULAR | Status: AC
  Filled 2024-01-22: qty 2

## 2024-01-22 MED ORDER — MIDAZOLAM HCL (PF) 2 MG/2ML IJ SOLN
INTRAMUSCULAR | Status: DC | PRN
  Administered 2024-01-22: 2 mg via INTRAVENOUS

## 2024-01-22 SURGICAL SUPPLY — 8 items
DISSECTOR HYDRO NUCLEUS 50X22 (MISCELLANEOUS) ×1 IMPLANT
FEE CATARACT SUITE SIGHTPATH (MISCELLANEOUS) ×1 IMPLANT
GLOVE PI ULTRA LF STRL 7.5 (GLOVE) ×1 IMPLANT
GLOVE SURG SYN 6.5 PF PI BL (GLOVE) ×1 IMPLANT
GLOVE SURG SYN 8.5 PF PI BL (GLOVE) ×1 IMPLANT
LENS IOL TECNIS EYHANCE 22.0 (Intraocular Lens) IMPLANT
NDL FILTER BLUNT 18X1 1/2 (NEEDLE) ×1 IMPLANT
SYR 3ML LL SCALE MARK (SYRINGE) ×1 IMPLANT

## 2024-01-22 NOTE — Transfer of Care (Signed)
 Immediate Anesthesia Transfer of Care Note  Patient: Daanya C Campanelli  Procedure(s) Performed: PHACOEMULSIFICATION, CATARACT, WITH IOL INSERTION 6.01 00:40.7 (Left: Eye)  Patient Location: PACU  Anesthesia Type: MAC  Level of Consciousness: awake, alert  and patient cooperative  Airway and Oxygen Therapy: Patient Spontanous Breathing  Post-op Assessment: Post-op Vital signs reviewed, Patient's Cardiovascular Status Stable, Respiratory Function Stable, Patent Airway and No signs of Nausea or vomiting  Post-op Vital Signs: Reviewed and stable  Complications: No notable events documented.

## 2024-01-22 NOTE — Op Note (Signed)
 OPERATIVE NOTE  Glenda Gomez [id number] 01/22/2024   PREOPERATIVE DIAGNOSIS:  Nuclear sclerotic cataract left eye.  H25.12   POSTOPERATIVE DIAGNOSIS:    Nuclear sclerotic cataract left eye.     PROCEDURE:  Phacoemusification with posterior chamber intraocular lens placement of the left eye   LENS:   Implant Name Type Inv. Item Serial No. Manufacturer Lot No. LRB No. Used Action  LENS IOL TECNIS EYHANCE 22.0 - [id number] Intraocular Lens LENS IOL TECNIS EYHANCE 22.0 [id number] [employer]  Left 1 Implanted      Procedure(s): PHACOEMULSIFICATION, CATARACT, WITH IOL INSERTION 6.01 00:40.7 (Left)  SURGEON:  Adine Novak, MD, MPH   ANESTHESIA:  Topical with tetracaine  drops augmented with 1% preservative-free intracameral lidocaine .  ESTIMATED BLOOD LOSS: <1 mL   COMPLICATIONS:  None.   DESCRIPTION OF PROCEDURE:  The patient was identified in the holding room and transported to the operating room and placed in the supine position under the operating microscope.  The left eye was identified as the operative eye and it was prepped and draped in the usual sterile ophthalmic fashion.   A 1.0 millimeter clear-corneal paracentesis was made at the 5:00 position. 0.5 ml of preservative-free 1% lidocaine  with epinephrine  was injected into the anterior chamber.  The anterior chamber was filled with viscoelastic.  A 2.4 millimeter keratome was used to make a near-clear corneal incision at the 2:00 position.  A curvilinear capsulorrhexis was made with a cystotome and capsulorrhexis forceps.  Balanced salt solution was used to hydrodissect and hydrodelineate the nucleus.   Phacoemulsification was then used in stop and chop fashion to remove the lens nucleus and epinucleus.  The remaining cortex was then removed using the irrigation and aspiration handpiece. Viscoelastic was then placed into the capsular bag to distend it for lens placement.  A lens was then injected into the capsular bag.  The remaining  viscoelastic was aspirated.   Wounds were hydrated with balanced salt solution.  The anterior chamber was inflated to a physiologic pressure with balanced salt solution.  Intracameral vigamox  0.1 mL undiltued was injected into the eye and a drop placed onto the ocular surface.  No wound leaks were noted.  The patient was taken to the recovery room in stable condition without complications of anesthesia or surgery  Adine Novak 01/22/2024, 10:44 AM

## 2024-01-22 NOTE — Anesthesia Preprocedure Evaluation (Signed)
 Anesthesia Evaluation  Patient identified by MRN, date of birth, ID band Patient awake    Reviewed: Allergy & Precautions, H&P , NPO status , Patient's Chart, lab work & pertinent test results  Airway Mallampati: III  TM Distance: >3 FB Neck ROM: Full    Dental no notable dental hx. (+) Poor Dentition, Missing, Caps, Chipped Cap left upper #12:   Pulmonary shortness of breath, former smoker   Pulmonary exam normal breath sounds clear to auscultation       Cardiovascular hypertension, Normal cardiovascular exam+ dysrhythmias  Rhythm:Regular Rate:Normal  11-14-23 echo I97.89- Other postprocedural complications and d.                                    I48.91- Unspecified atrial fibrillation (CMS/HHS.                                    R06.02- Shortness of breath.                                                         I47.10- Supraventricular tachycardia, unspecifie.                                    R94.31- Abnormal electrocardiogram (ECG) (EKG).                                       CONCLUSION -------------------------------------------------------------------------------  NORMAL LEFT VENTRICULAR SYSTOLIC FUNCTION WITH MILD LVH  ESTIMATED EF: >55%  NORMAL LA PRESSURES WITH NORMAL DIASTOLIC FUNCTION  NORMAL RIGHT VENTRICULAR SYSTOLIC FUNCTION  VALVULAR REGURGITATION: No AR, TRIVIAL MR, No PR, MILD TR  ESTIMATED RVSP: 30 mmHg (Normal)  NO VALVULAR STENOSIS     Neuro/Psych  Headaches PSYCHIATRIC DISORDERS  Depression     Neuromuscular disease negative neurological ROS  negative psych ROS   GI/Hepatic negative GI ROS, Neg liver ROS,GERD  ,,  Endo/Other  negative endocrine ROSHypothyroidism    Renal/GU negative Renal ROS  negative genitourinary   Musculoskeletal negative musculoskeletal ROS (+) Arthritis ,  Fibromyalgia -  Abdominal   Peds negative pediatric ROS (+)  Hematology negative hematology ROS (+)    Anesthesia Other Findings Medical History  Hypertension  Hypersomnia Myositis ossificans  Arthritis Thyroid disease  GERD (gastroesophageal reflux disease) Fibromyalgia  Osteoarthritis Hemorrhoid  Hypothyroidism  Dyspnea Environmental and seasonal allergies Lung cancer (HCC) Malignant neoplasm of right upper lobe of lung (HCC)  Adenocarcinoma (HCC) Polycythemia  Dysrhythmia Headache  Depression Supraventricular tachycardia  Atrial fibrillation (HCC) Mild pulmonary hypertension (HCC)  Mild tricuspid regurgitation by prior echocardiogram Status post right breast lumpectomy  History of sentinel lymph node dissection right Barrett's esophagus  S/P lobectomy of lung right 2021 History of gastric bypass open 1970s History of total knee arthroplasty, right History of total knee arthroplasty, left     Reproductive/Obstetrics negative OB ROS  Anesthesia Physical Anesthesia Plan  ASA: 3  Anesthesia Plan: MAC   Post-op Pain Management:    Induction: Intravenous  PONV Risk Score and Plan:   Airway Management Planned: Natural Airway and Nasal Cannula  Additional Equipment:   Intra-op Plan:   Post-operative Plan:   Informed Consent: I have reviewed the patients History and Physical, chart, labs and discussed the procedure including the risks, benefits and alternatives for the proposed anesthesia with the patient or authorized representative who has indicated his/her understanding and acceptance.     Dental Advisory Given  Plan Discussed with: Anesthesiologist, CRNA and Surgeon  Anesthesia Plan Comments: (Patient consented for risks of anesthesia including but not limited to:  - adverse reactions to medications - damage to eyes, teeth, lips or other oral mucosa - nerve damage due to positioning  - sore throat or hoarseness - Damage to heart, brain, nerves, lungs, other parts of body or loss of  life  Patient voiced understanding and assent.)         Anesthesia Quick Evaluation

## 2024-01-22 NOTE — H&P (Signed)
 [facility]   Primary Care Physician:  Sadie Manna, MD Ophthalmologist: Dr. Adine Novak  Pre-Procedure History & Physical: HPI:  Glenda Gomez is a 73 y.o. female here for cataract surgery.   Past Medical History:  Diagnosis Date   Adenocarcinoma (HCC)    right upper lobe   Arthritis    Atrial fibrillation (HCC)    Barrett's esophagus    Chicken pox    Depression    Dyspnea    with exertion from upper right lung removal   Dysrhythmia    A-fib occaisionally   Environmental and seasonal allergies    Fibromyalgia    GERD (gastroesophageal reflux disease)    Headache    from back pain   Hemorrhoid    History of gastric bypass 1970   open procedure   History of sentinel lymph node dissection 03/05/2021   right   History of total knee arthroplasty, left 11/17/2004   History of total knee arthroplasty, right 07/06/2016   Hypersomnia    Hypertension    Hypothyroidism    Lung cancer (HCC)    Malignant neoplasm of right upper lobe of lung (HCC) 03/08/2019   Mild pulmonary hypertension (HCC)    Mild tricuspid regurgitation by prior echocardiogram    Myositis ossificans    Osteoarthritis    Polycythemia 07/16/2021   dx by Dr. Sadie   S/P lobectomy of lung 2021   right upper lobe   Status post right breast lumpectomy 03/05/2021   Supraventricular tachycardia    Thyroid disease     Past Surgical History:  Procedure Laterality Date    bengin tongue lesion     Benign 1980's   ABDOMINAL HYSTERECTOMY  1979   BACK SURGERY     bladder tack  2011   BREAST BIOPSY Left    neg   BREAST BIOPSY Right 02/22/2021   u/s bc ribbon 8:00-- INVASIVE MAMMARY CARCINOMA, NO SPECIAL TYPE. Size of invasive carcinoma: 7 mm in this sample. Grade 1. Ductal carcinoma in situ: Present, intermediate grade. Lymphovascular invasion: Not identified   BREAST LUMPECTOMY WITH SENTINEL LYMPH NODE BIOPSY Right 03/05/2021   Procedure: BREAST LUMPECTOMY WITH SENTINEL LYMPH NODE BX;  Surgeon:  Dessa Reyes ORN, MD;  Location: [facility] ORS;  Service: General;  Laterality: Right;   CATARACT EXTRACTION W/PHACO Right 01/08/2024   Procedure: PHACOEMULSIFICATION, CATARACT, WITH IOL INSERTION 3.44 00:34.8;  Surgeon: Novak Adine Anes, MD;  Location: [facility];  Service: Ophthalmology;  Laterality: Right;   CHOLECYSTECTOMY  2010   COLONOSCOPY  12/22/2011   COLONOSCOPY WITH PROPOFOL  N/A 03/24/2017   Procedure: COLONOSCOPY WITH PROPOFOL ;  Surgeon: Gaylyn Gladis PENNER, MD;  Location: [facility] ENDOSCOPY;  Service: Endoscopy;  Laterality: N/A;   COLONOSCOPY WITH PROPOFOL  N/A 07/22/2020   Procedure: COLONOSCOPY WITH PROPOFOL ;  Surgeon: Toledo, Ladell POUR, MD;  Location: [facility] ENDOSCOPY;  Service: Gastroenterology;  Laterality: N/A;   ESOPHAGOGASTRODUODENOSCOPY (EGD) WITH PROPOFOL  N/A 03/24/2017   Procedure: ESOPHAGOGASTRODUODENOSCOPY (EGD) WITH PROPOFOL ;  Surgeon: Gaylyn Gladis PENNER, MD;  Location: [facility] END[facility];  Service: Endoscopy;  Laterality: N/A;   ESOPHAGOGASTRODUODENOSCOPY (EGD) WITH PROPOFOL  N/A 07/22/2020   Procedure: ESOPHAGOGASTRODUODENOSCOPY (EGD) WITH PROPOFOL ;  Surgeon: Toledo, Ladell POUR, MD;  Location: [facility] ENDOSCOPY;  Service: Gastroenterology;  Laterality: N/A;   GASTRIC BYPASS OPEN     1970's complicated by herniated stomach requiring reversal with mesh   HEMORRHOID SURGERY     INTERCOSTAL NERVE BLOCK Right 03/29/2019   Procedure: Intercostal Nerve Block;  Surgeon: Kerrin Elspeth JAYSON, MD;  Location: [facility] OR;  Service: Thoracic;  Laterality: Right;   JOINT REPLACEMENT Bilateral    TKR   KNEE ARTHROPLASTY Right 07/06/2016   Procedure: COMPUTER ASSISTED TOTAL KNEE ARTHROPLASTY;  Surgeon: Mardee Lynwood SQUIBB, MD;  Location: [facility] OR[facility];  Service: Orthopedics;  Laterality: Right;   KNEE ARTHROSCOPY Left 10/19/2006   x 3   KNEE ARTHROSCOPY Right    x2   LOBECTOMY Right 03/29/2019   XI ROBOTIC ASSISTED THORASCOPY- RIGHT UPPER LOBECTOMY (Right Chest)   NODE DISSECTION  03/29/2019    Procedure: Node Dissection;  Surgeon: Kerrin Elspeth BROCKS, MD;  Location: [facility] OR;  Service: Thoracic;;   plantar fascia Left    PLANTAR FASCIA RELEASE Right    x 3   POSTERIOR LAMINECTOMY / DECOMPRESSION LUMBAR SPINE  06/24/2015   SHOULDER ARTHROSCOPY WITH DEBRIDEMENT AND BICEP TENDON REPAIR Left 09/25/2018   Procedure: SHOULDER ARTHROSCOPY WITH DEBRIDEMENT DECOMPRESSION AND BICEP TENDON REPAIR - SCRUB TECH;  Surgeon: Edie Norleen PARAS, MD;  Location: [facility];  Service: Orthopedics;  Laterality: Left;   TONSILLECTOMY     childhood   TOTAL KNEE ARTHROPLASTY Left 11/17/2004   VIDEO BRONCHOSCOPY WITH ENDOBRONCHIAL NAVIGATION N/A 03/04/2019   Procedure: VIDEO BRONCHOSCOPY WITH ENDOBRONCHIAL NAVIGATION;  Surgeon: Parris Manna, MD;  Location: [facility];  Service: Thoracic;  Laterality: N/A;   VIDEO BRONCHOSCOPY WITH ENDOBRONCHIAL ULTRASOUND N/A 03/04/2019   Procedure: VIDEO BRONCHOSCOPY WITH ENDOBRONCHIAL ULTRASOUND;  Surgeon: Parris Manna, MD;  Location: [facility] ORS;  Service: Thoracic;  Laterality: N/A;    Prior to Admission medications   Medication Sig Start Date End Date Taking? Authorizing Provider  anastrozole  (ARIMIDEX ) 1 MG tablet Take 1 tablet (1 mg total) by mouth daily. 10/11/23  Yes Brahmanday, Govinda R, MD  aspirin EC 81 MG tablet Take 81 mg by mouth daily. Swallow whole.   Yes [provider]  atorvastatin (LIPITOR) 10 MG tablet Take 10 mg by mouth daily.   Yes [provider]  celecoxib  (CELEBREX ) 200 MG capsule Take 200 mg by mouth 2 (two) times daily.   Yes [provider]  cyclobenzaprine  (FLEXERIL ) 10 MG tablet Take 10 mg by mouth daily.   Yes [provider]  levothyroxine  (SYNTHROID ) 88 MCG tablet Take 88 mcg by mouth daily before breakfast. 03/04/20  Yes [provider]  losartan  (COZAAR ) 50 MG tablet Take 50 mg by mouth daily.   Yes [provider]  Multiple Vitamins-Minerals (EYE VITAMINS PO) Take 1 capsule by mouth  daily.   Yes [provider]  omeprazole (PRILOSEC) 20 MG capsule Take 20 mg by mouth 2 (two) times daily before a meal.    Yes [provider]  oxyCODONE -acetaminophen  (PERCOCET/ROXICET) 5-325 MG tablet 1/2 bid prn 08/30/23  Yes [provider]  predniSONE (DELTASONE) 10 MG tablet Take 10 mg by mouth as needed.   Yes [provider]  traMADol  (ULTRAM ) 50 MG tablet Take 50 mg by mouth 2 (two) times daily.   Yes [provider]  triamterene -hydrochlorothiazide  (MAXZIDE -25) 37.5-25 MG tablet Take 1 tablet by mouth daily. 08/20/23  Yes Mumma, Clotilda, MD  zolpidem  (AMBIEN ) 5 MG tablet Take 5-7.5 mg by mouth at bedtime.   Yes [provider]  FLUoxetine (PROZAC) 10 MG capsule Take 10 mg by mouth daily. 06/22/22 10/11/23  [provider]  furosemide  (LASIX ) 20 MG tablet Take 20 mg by mouth daily as needed for fluid or edema.    [provider]    Allergies as of 12/25/2023 - Review Complete  10/11/2023  Allergen Reaction Noted   Flagyl [metronidazole] Diarrhea and Nausea And Vomiting 07/17/2013   Phentermine Other (See Comments) 07/21/2020   Pregabalin Other (See Comments) 07/17/2013    Family History  Problem Relation Age of Onset   Breast cancer Sister 69   Breast cancer Maternal Aunt 58   Lung cancer Maternal Aunt    Breast cancer Sister 22   Alzheimer's disease Mother    Arthritis Mother    Diabetes Mother    Heart failure Brother    Pancreatic cancer Brother 37   Lung cancer Brother    Liver cancer Father     Social History   Socioeconomic History   Marital status: Divorced    Spouse name: Not on file   Number of children: Not on file   Years of education: Not on file   Highest education level: Not on file  Occupational History   Not on file  Tobacco Use   Smoking status: Former    Current packs/day: 0.00    Average packs/day: 2.0 packs/day for 10.0 years (20.0 ttl pk-yrs)    Types: Cigarettes    Start  date: 07/18/1983    Quit date: 07/17/1993    Years since quitting: 30.5   Smokeless tobacco: Never  Vaping Use   Vaping status: Never Used  Substance and Sexual Activity   Alcohol use: No   Drug use: Never   Sexual activity: Not on file  Other Topics Concern   Not on file  Social History Narrative   1ppd- quit > 20 years ago; no alcohol; in [place]; lives self. retd- cook.    Social Drivers of Corporate Investment Banker Strain: Low Risk  (11/14/2023)   Received from [facility]   Overall Financial Resource Strain (CARDIA)    Difficulty of Paying Living Expenses: Not hard at all  Recent Concern: Financial Resource Strain - Medium Risk (09/15/2023)   Received from [facility]   Overall Financial Resource Strain (CARDIA)    Difficulty of Paying Living Expenses: Somewhat hard  Food Insecurity: No Food Insecurity (11/14/2023)   Received from [facility]   Hunger Vital Sign    Within the past 12 months, you worried that your food would run out before you got the money to buy more.: Never true    Within the past 12 months, the food you bought just didn't last and you didn't have money to get more.: Never true  Transportation Needs: No Transportation Needs (11/14/2023)   Received from [facility] - Transportation    In the past 12 months, has lack of transportation kept you from medical appointments or from getting medications?: No    Lack of Transportation (Non-Medical): No  Physical Activity: Not on file  Stress: Not on file  Social Connections: Not on file  Intimate Partner Violence: Not on file    Review of Systems: See HPI, otherwise negative ROS  Physical Exam: BP (!) 155/98   Pulse (!) 52   Temp 97.7 F (36.5 C) (Temporal)   Resp 12   Ht 5' 2 (1.575 m)   Wt 115.8 kg   SpO2 96%   BMI 46.68 kg/m  General:   Alert, cooperative. Head:  Normocephalic and atraumatic. Respiratory:  Normal work  of breathing. Cardiovascular:  NAD  Impression/Plan: Glenda Gomez is here for cataract surgery.  Risks, benefits, limitations, and alternatives regarding cataract surgery have been reviewed with the  patient.  Questions have been answered.  All parties agreeable.   Adine Novak, MD  01/22/2024, 10:19 AM

## 2024-01-22 NOTE — Anesthesia Postprocedure Evaluation (Signed)
 Anesthesia Post Note  Patient: Glenda Gomez  Procedure(s) Performed: PHACOEMULSIFICATION, CATARACT, WITH IOL INSERTION 6.01 00:40.7 (Left: Eye)  Patient location during evaluation: PACU Anesthesia Type: MAC Level of consciousness: awake and alert Pain management: pain level controlled Vital Signs Assessment: post-procedure vital signs reviewed and stable Respiratory status: spontaneous breathing, nonlabored ventilation, respiratory function stable and patient connected to nasal cannula oxygen Cardiovascular status: stable and blood pressure returned to baseline Postop Assessment: no apparent nausea or vomiting Anesthetic complications: no   No notable events documented.   Last Vitals:  Vitals:   01/22/24 1047 01/22/24 1052  BP:    Pulse: (!) 44 (!) 51  Resp: 12 17  Temp: (!) 36.1 C (!) 36.1 C  SpO2: 97% 97%    Last Pain:  Vitals:   01/22/24 1052  TempSrc:   PainSc: 2                  Prentice Murphy

## 2024-02-12 ENCOUNTER — Other Ambulatory Visit

## 2024-02-12 ENCOUNTER — Inpatient Hospital Stay: Admission: RE | Admit: 2024-02-12 | Source: Ambulatory Visit

## 2024-02-22 IMAGING — CT CT CHEST W/O CM
1 series · 14 of 34 positions shown, 18 images · non-contrast
Comparison: January 28, 2021.

CLINICAL DATA: Non-small cell lung cancer follow-up evaluation.

* Tracking Code: BO *
EXAM:
CT CHEST WITHOUT CONTRAST
TECHNIQUE: Multidetector CT imaging of the chest was performed following the
standard protocol without IV contrast.
RADIATION DOSE REDUCTION: This exam was performed according to the
departmental dose-optimization program which includes automated
exposure control, adjustment of the mA and/or kV according to
patient size and/or use of iterative reconstruction technique.

[Series 2: thorax · axial · 0.67mm/px · z∈[-578,-344]mm · 14 of 139 slices shown, 18 images]
[im 11/139  mediastinal]
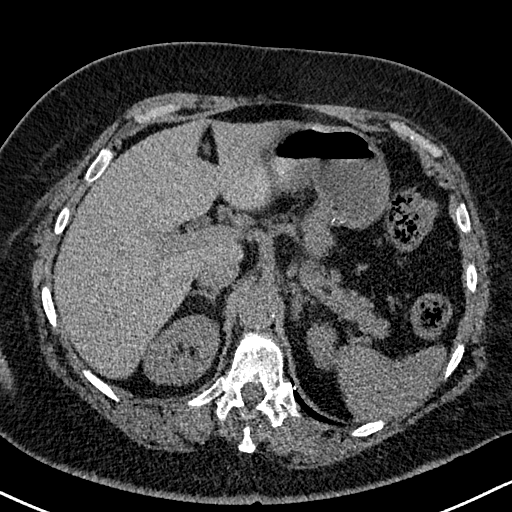
[im 11/139  lung]
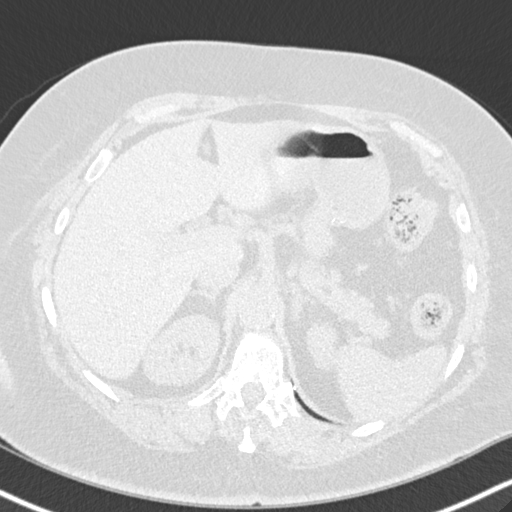
[im 21/139  lung]
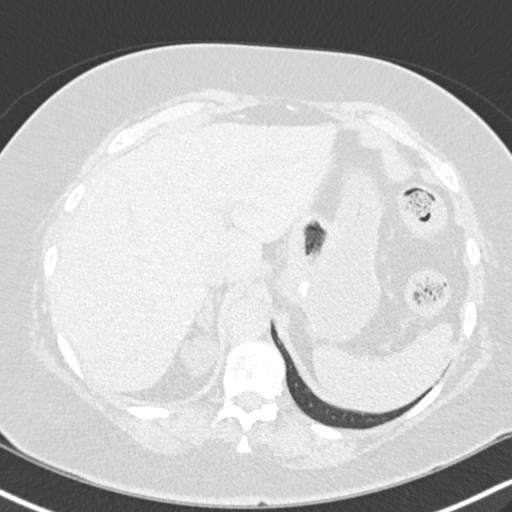
[im 28/139  lung]
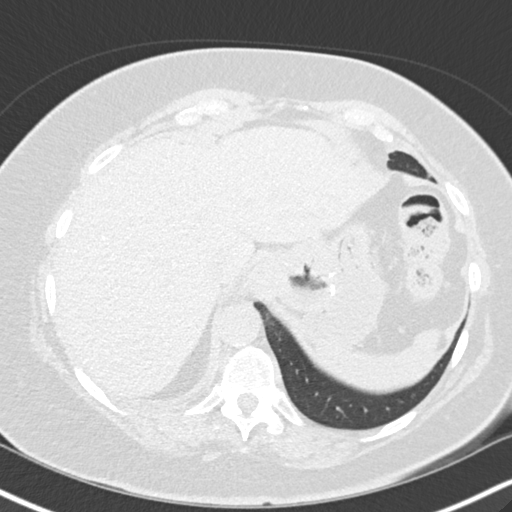
[im 41/139  lung]
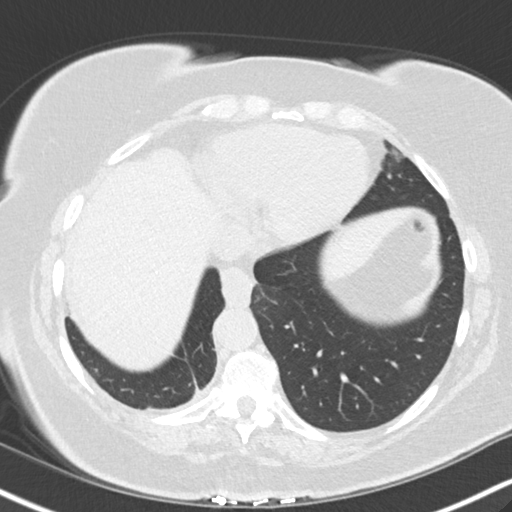
[im 52/139  mediastinal]
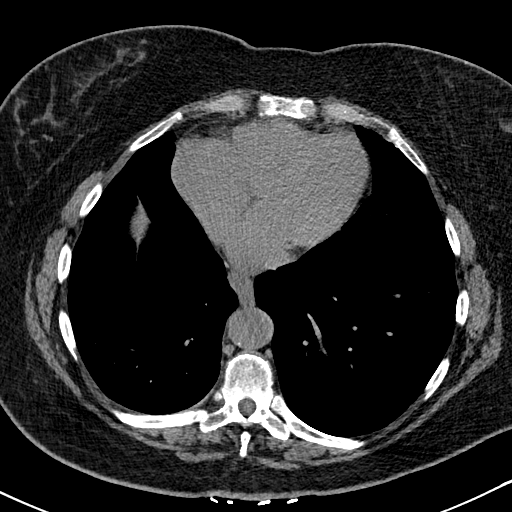
[im 52/139  lung]
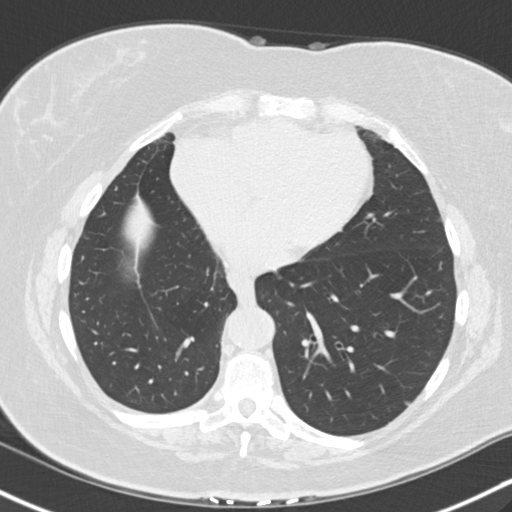
[im 57/139  lung]
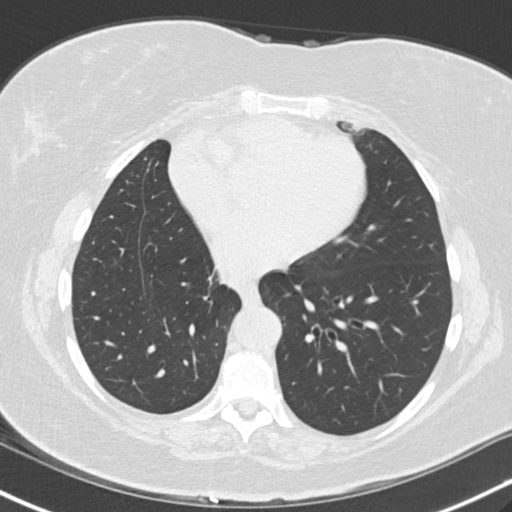
[im 65/139  lung]
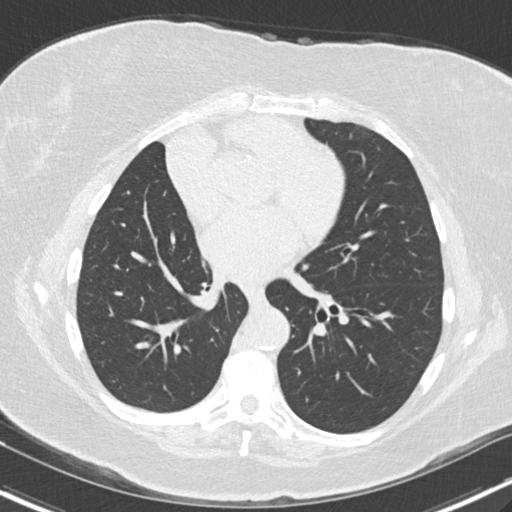
[im 75/139  lung]
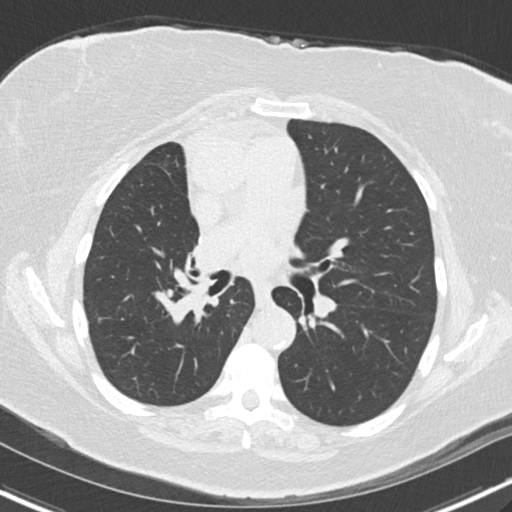
[im 82/139  mediastinal]
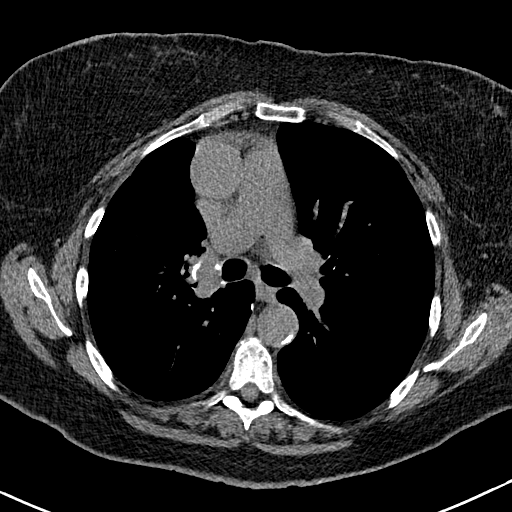
[im 82/139  lung]
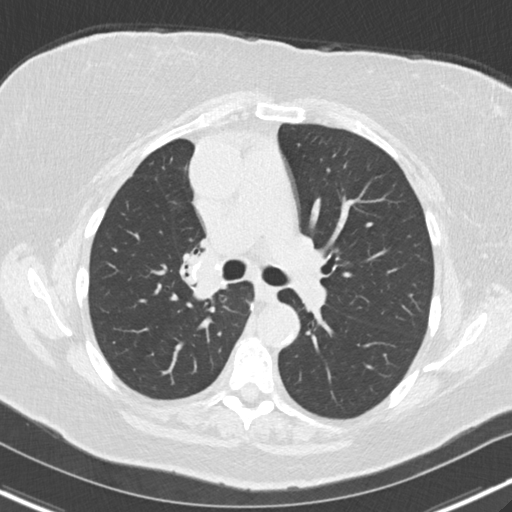
[im 87/139  lung]
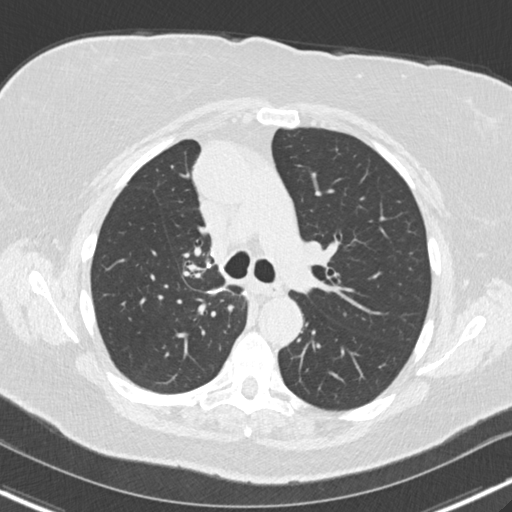
[im 103/139  lung]
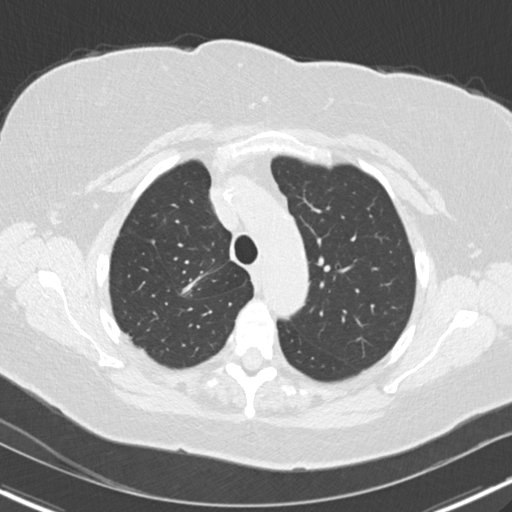
[im 111/139  lung]
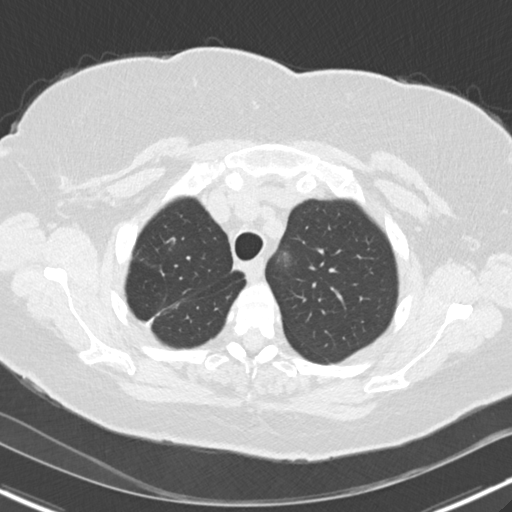
[im 118/139  mediastinal]
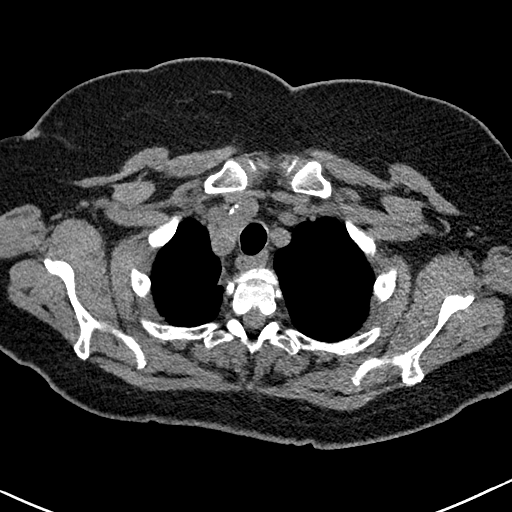
[im 118/139  lung]
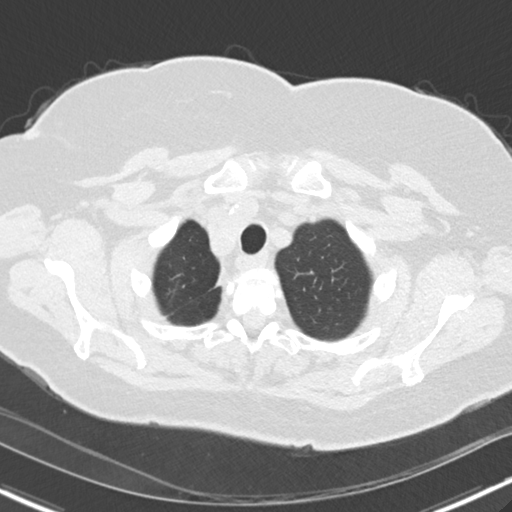
[im 128/139  lung]
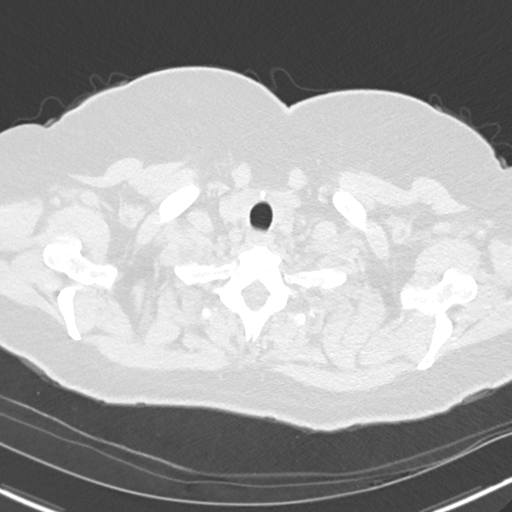

[14 of 34 positions shown; findings below may reference images not displayed]

FINDINGS: Cardiovascular: Calcified aortic atherosclerosis. Normal caliber of
the thoracic aorta. Normal heart size without pericardial effusion
or nodularity.

Normal caliber of central pulmonary vasculature. Limited assessment
of cardiovascular structures given lack of intravenous contrast.

Mediastinum/Nodes: Postoperative changes about the RIGHT hilum. No
signs of adenopathy in the mediastinum. Normal esophagus.

Lungs/Pleura: Post partial lung resection in the RIGHT chest, signs
of wedge resection and RIGHT upper lobectomy without change. No
consolidation. Trace amount of pleural thickening in the RIGHT lung
base similar to previous imaging.

Tiny nodule along the pleural surface in the LEFT chest is stable at
3 mm (image 87/3) airways are patent.

Stable RIGHT lower lobe pulmonary nodule 3 mm (image 63/3)

Nodular appearing areas at the RIGHT lung base largest area
approximately 9-10 mm favored to represent scarring and not changed
since Sunday June, 2020 (image 102/3).

Upper Abdomen: Imaged portions of liver, pancreas, spleen, adrenal
glands and kidneys without acute findings. Stable appearance of
RIGHT upper pole renal lesion measuring approximately 2.3 cm with
low-density likely representing sequela of decompressed renal cyst,
cyst stable to slightly increased in size dating back to 5855. No
dedicated follow-up recommended this finding. No upper abdominal
lymphadenopathy. Post gastric bypass.

Musculoskeletal: No acute bone finding. No destructive bone process.
Spinal degenerative changes.
IMPRESSION: 1. Post partial lung resection in the RIGHT chest, signs of wedge
resection and RIGHT upper lobectomy without change.
2. Stable small areas of nodularity about the RIGHT and LEFT chest.
3. Nodular area at the RIGHT lung base favored to be related to
pleural and parenchymal scarring at the RIGHT base, no change since
Sunday June, 2020. Attention on follow-up.
4. Post gastric bypass.

Aortic Atherosclerosis ([id number]).

## 2024-03-26 ENCOUNTER — Other Ambulatory Visit

## 2024-03-26 ENCOUNTER — Encounter

## 2024-04-17 ENCOUNTER — Ambulatory Visit: Admitting: Internal Medicine

## 2024-04-17 ENCOUNTER — Other Ambulatory Visit
